# Patient Record
Sex: Male | Born: 1980
Health system: Southern US, Community
[De-identification: ages and names within clinical notes are randomized; demographics above are authoritative.]

## PROBLEM LIST (undated history)

## (undated) DIAGNOSIS — K219 Gastro-esophageal reflux disease without esophagitis: Secondary | ICD-10-CM

## (undated) DIAGNOSIS — I10 Essential (primary) hypertension: Secondary | ICD-10-CM

## (undated) DIAGNOSIS — F419 Anxiety disorder, unspecified: Secondary | ICD-10-CM

## (undated) DIAGNOSIS — D649 Anemia, unspecified: Secondary | ICD-10-CM

## (undated) DIAGNOSIS — C801 Malignant (primary) neoplasm, unspecified: Secondary | ICD-10-CM

## (undated) DIAGNOSIS — K651 Peritoneal abscess: Secondary | ICD-10-CM

## (undated) DIAGNOSIS — K921 Melena: Secondary | ICD-10-CM

## (undated) HISTORY — PX: BACK SURGERY: SHX140

## (undated) HISTORY — DX: Essential (primary) hypertension: I10

---

## 2014-07-07 DIAGNOSIS — D649 Anemia, unspecified: Secondary | ICD-10-CM

## 2014-07-07 HISTORY — DX: Anemia, unspecified: D64.9

## 2014-07-07 HISTORY — PX: COLONOSCOPY WITH ESOPHAGOGASTRODUODENOSCOPY (EGD): SHX5779

## 2014-07-16 DIAGNOSIS — K921 Melena: Secondary | ICD-10-CM | POA: Insufficient documentation

## 2017-07-13 ENCOUNTER — Inpatient Hospital Stay (HOSPITAL_COMMUNITY)
Admission: EM | Admit: 2017-07-13 | Discharge: 2017-08-08 | DRG: 987 | Disposition: A | Discharge status: Home / Self Care | Payer: 59 | Attending: General Surgery | Admitting: General Surgery

## 2017-07-13 ENCOUNTER — Emergency Department (HOSPITAL_COMMUNITY): Payer: 59

## 2017-07-13 ENCOUNTER — Other Ambulatory Visit: Payer: Self-pay

## 2017-07-13 ENCOUNTER — Encounter (HOSPITAL_COMMUNITY): Payer: Self-pay

## 2017-07-13 DIAGNOSIS — Y95 Nosocomial condition: Secondary | ICD-10-CM | POA: Diagnosis not present

## 2017-07-13 DIAGNOSIS — I10 Essential (primary) hypertension: Secondary | ICD-10-CM | POA: Diagnosis present

## 2017-07-13 DIAGNOSIS — A419 Sepsis, unspecified organism: Secondary | ICD-10-CM | POA: Diagnosis not present

## 2017-07-13 DIAGNOSIS — E860 Dehydration: Secondary | ICD-10-CM | POA: Diagnosis present

## 2017-07-13 DIAGNOSIS — D62 Acute posthemorrhagic anemia: Secondary | ICD-10-CM | POA: Diagnosis not present

## 2017-07-13 DIAGNOSIS — K651 Peritoneal abscess: Secondary | ICD-10-CM | POA: Diagnosis present

## 2017-07-13 DIAGNOSIS — C49A3 Gastrointestinal stromal tumor of small intestine: Secondary | ICD-10-CM | POA: Diagnosis not present

## 2017-07-13 DIAGNOSIS — R21 Rash and other nonspecific skin eruption: Secondary | ICD-10-CM | POA: Diagnosis not present

## 2017-07-13 DIAGNOSIS — K631 Perforation of intestine (nontraumatic): Secondary | ICD-10-CM | POA: Diagnosis present

## 2017-07-13 DIAGNOSIS — K219 Gastro-esophageal reflux disease without esophagitis: Secondary | ICD-10-CM | POA: Diagnosis not present

## 2017-07-13 DIAGNOSIS — N170 Acute kidney failure with tubular necrosis: Secondary | ICD-10-CM | POA: Diagnosis not present

## 2017-07-13 DIAGNOSIS — E872 Acidosis: Secondary | ICD-10-CM | POA: Diagnosis present

## 2017-07-13 DIAGNOSIS — N179 Acute kidney failure, unspecified: Secondary | ICD-10-CM

## 2017-07-13 DIAGNOSIS — R1031 Right lower quadrant pain: Secondary | ICD-10-CM | POA: Diagnosis not present

## 2017-07-13 DIAGNOSIS — K5900 Constipation, unspecified: Secondary | ICD-10-CM | POA: Diagnosis present

## 2017-07-13 DIAGNOSIS — E875 Hyperkalemia: Secondary | ICD-10-CM | POA: Diagnosis not present

## 2017-07-13 DIAGNOSIS — J189 Pneumonia, unspecified organism: Secondary | ICD-10-CM | POA: Diagnosis not present

## 2017-07-13 DIAGNOSIS — T368X5A Adverse effect of other systemic antibiotics, initial encounter: Secondary | ICD-10-CM | POA: Diagnosis present

## 2017-07-13 DIAGNOSIS — Z87891 Personal history of nicotine dependence: Secondary | ICD-10-CM | POA: Diagnosis not present

## 2017-07-13 DIAGNOSIS — L299 Pruritus, unspecified: Secondary | ICD-10-CM | POA: Diagnosis not present

## 2017-07-13 DIAGNOSIS — R509 Fever, unspecified: Secondary | ICD-10-CM

## 2017-07-13 DIAGNOSIS — N19 Unspecified kidney failure: Secondary | ICD-10-CM

## 2017-07-13 HISTORY — DX: Anemia, unspecified: D64.9

## 2017-07-13 HISTORY — DX: Peritoneal abscess: K65.1

## 2017-07-13 HISTORY — DX: Gastro-esophageal reflux disease without esophagitis: K21.9

## 2017-07-13 HISTORY — DX: Melena: K92.1

## 2017-07-13 LAB — URINALYSIS, ROUTINE W REFLEX MICROSCOPIC
Bilirubin Urine: NEGATIVE
Glucose, UA: NEGATIVE mg/dL
Hgb urine dipstick: NEGATIVE
Ketones, ur: 5 mg/dL — AB
Leukocytes, UA: NEGATIVE
Nitrite: NEGATIVE
Protein, ur: NEGATIVE mg/dL
Specific Gravity, Urine: 1.02 (ref 1.005–1.030)
pH: 6 (ref 5.0–8.0)

## 2017-07-13 LAB — CBC
HCT: 42.4 % (ref 39.0–52.0)
Hemoglobin: 15.3 g/dL (ref 13.0–17.0)
MCH: 33.2 pg (ref 26.0–34.0)
MCHC: 36.1 g/dL — ABNORMAL HIGH (ref 30.0–36.0)
MCV: 92 fL (ref 78.0–100.0)
Platelets: 258 10*3/uL (ref 150–400)
RBC: 4.61 MIL/uL (ref 4.22–5.81)
RDW: 13 % (ref 11.5–15.5)
WBC: 11.8 10*3/uL — ABNORMAL HIGH (ref 4.0–10.5)

## 2017-07-13 LAB — I-STAT CG4 LACTIC ACID, ED: Lactic Acid, Venous: 1.89 mmol/L (ref 0.5–1.9)

## 2017-07-13 LAB — COMPREHENSIVE METABOLIC PANEL
ALT: 19 U/L (ref 17–63)
AST: 20 U/L (ref 15–41)
Albumin: 3.8 g/dL (ref 3.5–5.0)
Alkaline Phosphatase: 90 U/L (ref 38–126)
Anion gap: 11 (ref 5–15)
BUN: 9 mg/dL (ref 6–20)
CO2: 27 mmol/L (ref 22–32)
Calcium: 9.5 mg/dL (ref 8.9–10.3)
Chloride: 96 mmol/L — ABNORMAL LOW (ref 101–111)
Creatinine, Ser: 1.24 mg/dL (ref 0.61–1.24)
GFR calc Af Amer: >60 mL/min (ref >60)
GFR calc non Af Amer: >60 mL/min (ref >60)
Glucose, Bld: 125 mg/dL — ABNORMAL HIGH (ref 65–99)
Potassium: 4.4 mmol/L (ref 3.5–5.1)
Sodium: 134 mmol/L — ABNORMAL LOW (ref 135–145)
Total Bilirubin: 1.4 mg/dL — ABNORMAL HIGH (ref 0.3–1.2)
Total Protein: 7.2 g/dL (ref 6.5–8.1)

## 2017-07-13 LAB — LIPASE, BLOOD: Lipase: 26 U/L (ref 11–51)

## 2017-07-13 MED ORDER — PIPERACILLIN-TAZOBACTAM 3.375 G IVPB: 3.3750 g | Freq: Three times a day (TID) | INTRAVENOUS | Status: DC

## 2017-07-13 MED ORDER — ZOLPIDEM TARTRATE 5 MG PO TABS
5.0000 mg | ORAL_TABLET | Freq: Every evening | ORAL | Status: DC | PRN
  Administered 2017-07-19 – 2017-07-23 (×7): 5 mg via ORAL
  Filled 2017-07-13 (×8): qty 1

## 2017-07-13 MED ORDER — ONDANSETRON HCL 4 MG/2ML IJ SOLN
4.0000 mg | Freq: Four times a day (QID) | INTRAMUSCULAR | Status: DC | PRN
  Administered 2017-07-14 – 2017-07-15 (×3): 4 mg via INTRAVENOUS
  Filled 2017-07-13 (×2): qty 2

## 2017-07-13 MED ORDER — ACETAMINOPHEN 650 MG RE SUPP: 650.0000 mg | Freq: Four times a day (QID) | RECTAL | Status: DC | PRN

## 2017-07-13 MED ORDER — IOHEXOL 300 MG/ML  SOLN
100.0000 mL | Freq: Once | INTRAMUSCULAR | Status: AC | PRN
  Administered 2017-07-13: 100 mL via INTRAVENOUS

## 2017-07-13 MED ORDER — KCL IN DEXTROSE-NACL 20-5-0.45 MEQ/L-%-% IV SOLN
INTRAVENOUS | Status: DC
  Administered 2017-07-13 – 2017-07-15 (×5): via INTRAVENOUS
  Administered 2017-07-15: 1 mL via INTRAVENOUS
  Administered 2017-07-16: 06:00:00 via INTRAVENOUS
  Filled 2017-07-13 (×8): qty 1000

## 2017-07-13 MED ORDER — SODIUM CHLORIDE 0.9 % IV BOLUS
1000.0000 mL | Freq: Once | INTRAVENOUS | Status: AC
  Administered 2017-07-13: 1000 mL via INTRAVENOUS

## 2017-07-13 MED ORDER — ENOXAPARIN SODIUM 40 MG/0.4ML ~~LOC~~ SOLN
40.0000 mg | SUBCUTANEOUS | Status: DC
  Administered 2017-07-13 – 2017-07-14 (×2): 40 mg via SUBCUTANEOUS
  Filled 2017-07-13 (×2): qty 0.4

## 2017-07-13 MED ORDER — FAMOTIDINE IN NACL 20-0.9 MG/50ML-% IV SOLN
20.0000 mg | Freq: Two times a day (BID) | INTRAVENOUS | Status: DC
  Administered 2017-07-13 – 2017-07-16 (×6): 20 mg via INTRAVENOUS
  Filled 2017-07-13 (×6): qty 50

## 2017-07-13 MED ORDER — PIPERACILLIN-TAZOBACTAM 3.375 G IVPB 30 MIN
3.3750 g | Freq: Once | INTRAVENOUS | Status: AC
  Administered 2017-07-13: 3.375 g via INTRAVENOUS
  Filled 2017-07-13: qty 50

## 2017-07-13 MED ORDER — ACETAMINOPHEN 325 MG PO TABS
650.0000 mg | ORAL_TABLET | Freq: Four times a day (QID) | ORAL | Status: DC | PRN
  Administered 2017-07-13 – 2017-07-15 (×5): 650 mg via ORAL
  Filled 2017-07-13 (×5): qty 2

## 2017-07-13 MED ORDER — ONDANSETRON 4 MG PO TBDP
4.0000 mg | ORAL_TABLET | Freq: Four times a day (QID) | ORAL | Status: DC | PRN
  Administered 2017-07-21 – 2017-08-06 (×5): 4 mg via ORAL
  Filled 2017-07-13 (×6): qty 1

## 2017-07-13 MED ORDER — PIPERACILLIN-TAZOBACTAM 3.375 G IVPB
3.3750 g | Freq: Three times a day (TID) | INTRAVENOUS | Status: DC
  Filled 2017-07-13 (×2): qty 50

## 2017-07-13 MED ORDER — MORPHINE SULFATE (PF) 4 MG/ML IV SOLN
1.0000 mg | INTRAVENOUS | Status: DC | PRN
  Administered 2017-07-14 – 2017-07-15 (×2): 2 mg via INTRAVENOUS
  Filled 2017-07-13 (×2): qty 1

## 2017-07-13 MED ORDER — DIPHENHYDRAMINE HCL 12.5 MG/5ML PO ELIX: 12.5000 mg | ORAL_SOLUTION | Freq: Four times a day (QID) | ORAL | Status: DC | PRN

## 2017-07-13 MED ORDER — PROMETHAZINE HCL 25 MG/ML IJ SOLN
12.5000 mg | Freq: Four times a day (QID) | INTRAMUSCULAR | Status: DC | PRN
  Administered 2017-07-16 – 2017-07-31 (×14): 12.5 mg via INTRAVENOUS
  Filled 2017-07-13 (×18): qty 1

## 2017-07-13 MED ORDER — DIPHENHYDRAMINE HCL 50 MG/ML IJ SOLN: 12.5000 mg | Freq: Four times a day (QID) | INTRAMUSCULAR | Status: DC | PRN

## 2017-07-13 NOTE — ED Notes (Signed)
Notified phlebotomy blood cultures needed x 2.

## 2017-07-13 NOTE — H&P (Signed)
[facility] Admission Note  KALAI BACA [birth year]  [id number].    Requesting MD: Carmin Muskrat Chief Complaint/Reason for Consult: abdominal abscess  HPI:  Joe Morris is a 37yo male who presented to [facility] earlier today with 4 days of persistent abdominal pain, fevers, and chills. States that the pain started Saturday evening. He had run a 5K with his children earlier in the day and ate pancakes for breakfast and pizza for lunch. He did not eat dinner because he did not feel well. Denies any known trauma to his abdomen. The pain is mostly central and radiates slightly to the right. Pain is worse with palpation or sitting up. He has had 1 episode of n/v. Denies diarrhea, constipation, dysuria, or blood in stool. States that he has never had pain like this before.   In the ED patient was found to have a TMAX 100.5, WBC 11.8, lactic acid 1.89, hemoglobin 15.3. CT scan abdomen pelvis showed complex 7.4 x 5.1 x 4.3 cm abscess central/left paracentral aspect of the abdomen displacing proximal small bowel loops and reaching the undersurface of the transverse colon; prominent vascular blush along the posterior superior aspect. Patient denies any NSAID/aspirin use. He does not smoke. He drinks about 2 beers daily. He does not take any anticoagulants. No prior h/o abdominal surgery.  Of note, patient does have a prior history of upper GI bleed in 2016. He developed symptomatic anemia with melena. He underwent EGD and colonoscopy at [facility]. Per patient these procedures were normal and did not reveal the cause of his GI bleed. Bleeding resolved spontaneously.  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  HENT: Negative.   Eyes: Negative.   Respiratory: Negative.   Cardiovascular: Negative.   Gastrointestinal: Positive for abdominal pain, nausea and vomiting. Negative for blood in stool, constipation,  diarrhea and melena.  Genitourinary: Negative.   Musculoskeletal: Negative.   Skin: Negative.   Neurological: Negative.    All systems reviewed and otherwise negative except for as above  History reviewed. No pertinent family history.  Past Medical History:  Diagnosis Date  . GI bleed     Past Surgical History:  Procedure Laterality Date  . COLONOSCOPY      Social History:  reports that he has never smoked. He does not have any smokeless tobacco history on file. He reports that he drinks alcohol. He reports that he does not use drugs.  Allergies: No Known Allergies   (Not in a hospital admission)  Prior to Admission medications   Not on File    Blood pressure 140/88, pulse (!) 107, temperature (!) 100.5 F (38.1 C), temperature source Oral, resp. rate 16, height _0  (1.702 m), weight 73.9 kg (163 lb), SpO2 100 %. Physical Exam: General: pleasant, WD/WN white male who is laying in bed in NAD HEENT: head is normocephalic, atraumatic.  Sclera are noninjected.  Pupils equal and round.  Ears and nose without any masses or lesions.  Mouth is pink and moist. Dentition fair Heart: regular, rate, and rhythm.  No obvious murmurs, gallops, or rubs noted.  Palpable pedal pulses bilaterally Lungs: CTAB, no wheezes, rhonchi, or rales noted.  Respiratory effort nonlabored Abd: soft, mild distension, +BS, no masses, hernias, or organomegaly. TTP central abdomen just proximal to umbilicus without rebound or guarding.  MS: all 4 extremities are symmetrical with no cyanosis, clubbing, or edema. Skin: warm and dry with no masses,  lesions, or rashes Psych: A&Ox3 with an appropriate affect. Neuro: cranial nerves grossly intact, extremity CSM intact bilaterally, normal speech  Results for orders placed or performed during the hospital encounter of 07/13/17 (from the past 48 hour(s))  Lipase, blood     Status: None   Collection Time: 07/13/17  1:04 PM  Result Value Ref Range   Lipase 26 11  - 51 U/L    Comment: Performed at [facility], [place] [address], [place], [place] [address]  Comprehensive metabolic panel     Status: Abnormal   Collection Time: 07/13/17  1:04 PM  Result Value Ref Range   Sodium 134 (L) 135 - 145 mmol/L   Potassium 4.4 3.5 - 5.1 mmol/L   Chloride 96 (L) 101 - 111 mmol/L   CO2 27 22 - 32 mmol/L   Glucose, Bld 125 (H) 65 - 99 mg/dL   BUN 9 6 - 20 mg/dL   Creatinine, Ser 1.24 0.61 - 1.24 mg/dL   Calcium 9.5 8.9 - 10.3 mg/dL   Total Protein 7.2 6.5 - 8.1 g/dL   Albumin 3.8 3.5 - 5.0 g/dL   AST 20 15 - 41 U/L   ALT 19 17 - 63 U/L   Alkaline Phosphatase 90 38 - 126 U/L   Total Bilirubin 1.4 (H) 0.3 - 1.2 mg/dL   GFR calc non Af Amer >60 >60 mL/min   GFR calc Af Amer >60 >60 mL/min    Comment: (NOTE) The eGFR has been calculated using the CKD EPI equation. This calculation has not been validated in all clinical situations. eGFR's persistently <60 mL/min signify possible Chronic Kidney Disease.    Anion gap 11 5 - 15    Comment: Performed at [facility] [address], [place], [place] [address]  CBC     Status: Abnormal   Collection Time: 07/13/17  1:04 PM  Result Value Ref Range   WBC 11.8 (H) 4.0 - 10.5 K/uL   RBC 4.61 4.22 - 5.81 MIL/uL   Hemoglobin 15.3 13.0 - 17.0 g/dL   HCT 42.4 39.0 - 52.0 %   MCV 92.0 78.0 - 100.0 fL   MCH 33.2 26.0 - 34.0 pg   MCHC 36.1 (H) 30.0 - 36.0 g/dL   RDW 13.0 11.5 - 15.5 %   Platelets 258 150 - 400 K/uL    Comment: Performed at [facility] [address], [place], [place] [address]  I-Stat CG4 Lactic Acid, ED     Status: None   Collection Time: 07/13/17  1:46 PM  Result Value Ref Range   Lactic Acid, Venous 1.89 0.5 - 1.9 mmol/L   Ct Abdomen Pelvis W Contrast  Result Date: 07/13/2017 CLINICAL DATA:  37 year old male with abdominal pain. Initial encounter. EXAM: CT ABDOMEN AND PELVIS WITH CONTRAST TECHNIQUE: Multidetector CT imaging of the abdomen and pelvis was performed using the  standard protocol following bolus administration of intravenous contrast. CONTRAST:  135m OMNIPAQUE IOHEXOL 300 MG/ML  SOLN COMPARISON:  None. FINDINGS: Lower chest: No lung base abnormality.  Heart size top-normal. Hepatobiliary: Enlarged liver spanning over 19.2 cm. No focal hepatic lesion. No calcified gallstone or common bile duct stone. Pancreas: No pancreatic mass or primary pancreatic inflammation. Spleen: Spleen top-normal size.  No splenic mass. Adrenals/Urinary Tract: No obstructing stone or hydronephrosis. No worrisome renal or adrenal mass. Noncontrast Stomach/Bowel: Complex 7.4 x 5.1 x 4.3 cm abscess central/left paracentral aspect of the abdomen displacing proximal small bowel loops and reaching  the undersurface of the transverse colon. Prominent vascular blush along the posterior superior aspect. Significant inflammation of surrounding fat planes. Etiology indeterminate. The most abnormal appearing bowel is the proximal small bowel. This abscess may reflect small bowel perforation, possibly from ingested material, underlying inflammatory process and less likely (although not excluded), underlying mass. I suspect involvement of the undersurface of the transverse colon is secondary. Adjacent lymph nodes probably reactive in origin. Mass effect upon the superior mesenteric vein which is narrowed. Appendix partially fluid-filled and separate from the abscess. No inflammation surrounds the appendix. Vascular/Lymphatic: No abdominal aortic aneurysm or large arterial vessel occlusion. Lymph nodes as noted above. Reproductive: Negative Other: No free air separate from abscess. Musculoskeletal: No worrisome osseous lesion. IMPRESSION: Complex 7.4 x 5.1 x 4.3 cm abscess central/left paracentral aspect of the abdomen displacing proximal small bowel loops and reaching the undersurface of the transverse colon. Prominent vascular blush along the posterior superior aspect. Significant inflammation of surrounding  fat planes. Etiology indeterminate. The most abnormal appearing bowel is the proximal small bowel. This abscess may reflect small bowel perforation, possibly from ingested material, underlying inflammatory process and less likely (although not excluded), underlying mass. I suspect involvement of the undersurface of the transverse colon is secondary. Adjacent lymph nodes probably reactive in origin. Mass effect upon the superior mesenteric vein which is narrowed. Enlarged liver spanning over 19.2 cm. These results were called by telephone at the time of interpretation on 07/13/2017 at 2:59 pm to Dr. Okey Regal , who verbally acknowledged these results. Request for surgical consultation. Electronically Signed   By: Genia Del M.D.   On: 07/13/2017 15:18   Anti-infectives (From admission, onward)   Start     Dose/Rate Route Frequency Ordered Stop   07/13/17 1530  piperacillin-tazobactam (ZOSYN) IVPB 3.375 g     3.375 g 100 mL/hr over 30 Minutes Intravenous  Once 07/13/17 1515          Assessment/Plan Prior h/o GI bleed with no known cause, workup unrevealing  Complex abdominal abscess - 4 days of abdominal pain, fever, chills - CT scan abdomen pelvis showed complex 7.4 x 5.1 x 4.3 cm abscess central/left paracentral aspect of the abdomen displacing proximal small bowel loops and reaching the undersurface of the transverse colon; prominent vascular blush along the posterior superior aspect. - TMAX 100.5, WBC 11.8, lactic acid 1.89, hemoglobin 15.3.   ID - zosyn 5/8>> VTE - SCDs FEN - IVF, NPO Foley - none Follow up - TBD  Plan - Patient with complex abdominal abscess of unknown origin associated with fever, chills, and leukocytosis. Likely will require exploratory laparotomy; MD to see patient. Keep NPO and continue IV zosyn.  Wellington Hampshire, [facility] 07/13/2017, 4:16 PM Pager: [phone] Consults: [phone] Mon-Fri 7:00 am-4:30 pm Sat-Sun 7:00 am-11:30  am

## 2017-07-13 NOTE — ED Notes (Signed)
Patient transported to CT 

## 2017-07-13 NOTE — ED Triage Notes (Signed)
Pt endorses RLQ and mid abd pain with fever since Saturday. Pt last took 1g tylenol at 0700 this morning. Oral temp 100.5 in triage. Tachy in triage.

## 2017-07-13 NOTE — ED Provider Notes (Signed)
[facility] EMERGENCY DEPARTMENT Provider Note   CSN: [id number] Arrival date & time: 07/13/17  1246     History   Chief Complaint Chief Complaint  Patient presents with  . Abdominal Pain    HPI Joe KEMPPAINEN is a 37 y.o. male.  HPI   37 year old male presents today with complaints of abdominal pain.  Patient notes 5 days ago he had right lower quadrant abdominal pain.  Patient notes his he has continued to worsen with the addition of fever.  Patient notes a bowel movement on Sunday, none since, he does note his urine has been slightly darker but he feels dehydrated.  Patient notes that he had Jell-O at 9 AM this morning. Pt reports history of GI bleed in 2016 with normal colonoscopy.. She notes prior to events he has remained active, noting he was actually exercising on the day symptoms started.   Past Medical History:  Diagnosis Date  . GI bleed     Patient Active Problem List   Diagnosis Date Noted  . Intra-abdominal abscess ([place]) 07/13/2017    Past Surgical History:  Procedure Laterality Date  . COLONOSCOPY          Home Medications    Prior to Admission medications   Medication Sig Start Date End Date Taking? Authorizing Provider  omeprazole (PRILOSEC) 20 MG capsule Take 20 mg by mouth as needed (Heartburn).   Yes [provider]    Family History History reviewed. No pertinent family history.  Social History Social History   Tobacco Use  . Smoking status: Never Smoker  Substance Use Topics  . Alcohol use: Yes    Frequency: Never    Comment: 6 beers per week  . Drug use: Never     Allergies   Patient has no known allergies.   Review of Systems Review of Systems  All other systems reviewed and are negative.    Physical Exam Updated Vital Signs BP 129/81 (BP Location: Right Arm)   Pulse 99   Temp 99.6 F (37.6 C) (Oral)   Resp 16   Ht 5\' 7"  (1.702 m)   Wt 73.9 kg (163 lb)   SpO2 100%   BMI 25.53 kg/m     Physical Exam  Constitutional: He is oriented to person, place, and time. He appears well-developed and well-nourished.  HENT:  Head: Normocephalic and atraumatic.  Eyes: Pupils are equal, round, and reactive to light. Conjunctivae are normal. Right eye exhibits no discharge. Left eye exhibits no discharge. No scleral icterus.  Neck: Normal range of motion. No JVD present. No tracheal deviation present.  Pulmonary/Chest: Effort normal. No stridor.  Abdominal: Soft. He exhibits no distension and no mass. There is tenderness. There is no rebound and no guarding. No hernia.  Exquisite tenderness palpation of mid abdomen, right lower abdomen  Neurological: He is alert and oriented to person, place, and time. Coordination normal.  Psychiatric: He has a normal mood and affect. His behavior is normal. Judgment and thought content normal.  Nursing note and vitals reviewed.    ED Treatments / Results  Labs (all labs ordered are listed, but only abnormal results are displayed) Labs Reviewed  COMPREHENSIVE METABOLIC PANEL - Abnormal; Notable for the following components:      Result Value   Sodium 134 (*)    Chloride 96 (*)    Glucose, Bld 125 (*)    Total Bilirubin 1.4 (*)    All other components within normal limits  CBC - Abnormal; Notable for the following components:   WBC 11.8 (*)    MCHC 36.1 (*)    All other components within normal limits  URINALYSIS, ROUTINE W REFLEX MICROSCOPIC - Abnormal; Notable for the following components:   Ketones, ur 5 (*)    All other components within normal limits  CULTURE, BLOOD (ROUTINE X 2)  CULTURE, BLOOD (ROUTINE X 2)  LIPASE, BLOOD  HIV ANTIBODY (ROUTINE TESTING)  BASIC METABOLIC PANEL  CBC  I-STAT CG4 LACTIC ACID, ED  TYPE AND SCREEN    EKG None  Radiology Ct Abdomen Pelvis W Contrast  Result Date: 07/13/2017 CLINICAL DATA:  37 year old male with abdominal pain. Initial encounter. EXAM: CT ABDOMEN AND PELVIS WITH CONTRAST  TECHNIQUE: Multidetector CT imaging of the abdomen and pelvis was performed using the standard protocol following bolus administration of intravenous contrast. CONTRAST:  113mL OMNIPAQUE IOHEXOL 300 MG/ML  SOLN COMPARISON:  None. FINDINGS: Lower chest: No lung base abnormality.  Heart size top-normal. Hepatobiliary: Enlarged liver spanning over 19.2 cm. No focal hepatic lesion. No calcified gallstone or common bile duct stone. Pancreas: No pancreatic mass or primary pancreatic inflammation. Spleen: Spleen top-normal size.  No splenic mass. Adrenals/Urinary Tract: No obstructing stone or hydronephrosis. No worrisome renal or adrenal mass. Noncontrast Stomach/Bowel: Complex 7.4 x 5.1 x 4.3 cm abscess central/left paracentral aspect of the abdomen displacing proximal small bowel loops and reaching the undersurface of the transverse colon. Prominent vascular blush along the posterior superior aspect. Significant inflammation of surrounding fat planes. Etiology indeterminate. The most abnormal appearing bowel is the proximal small bowel. This abscess may reflect small bowel perforation, possibly from ingested material, underlying inflammatory process and less likely (although not excluded), underlying mass. I suspect involvement of the undersurface of the transverse colon is secondary. Adjacent lymph nodes probably reactive in origin. Mass effect upon the superior mesenteric vein which is narrowed. Appendix partially fluid-filled and separate from the abscess. No inflammation surrounds the appendix. Vascular/Lymphatic: No abdominal aortic aneurysm or large arterial vessel occlusion. Lymph nodes as noted above. Reproductive: Negative Other: No free air separate from abscess. Musculoskeletal: No worrisome osseous lesion. IMPRESSION: Complex 7.4 x 5.1 x 4.3 cm abscess central/left paracentral aspect of the abdomen displacing proximal small bowel loops and reaching the undersurface of the transverse colon. Prominent vascular  blush along the posterior superior aspect. Significant inflammation of surrounding fat planes. Etiology indeterminate. The most abnormal appearing bowel is the proximal small bowel. This abscess may reflect small bowel perforation, possibly from ingested material, underlying inflammatory process and less likely (although not excluded), underlying mass. I suspect involvement of the undersurface of the transverse colon is secondary. Adjacent lymph nodes probably reactive in origin. Mass effect upon the superior mesenteric vein which is narrowed. Enlarged liver spanning over 19.2 cm. These results were called by telephone at the time of interpretation on 07/13/2017 at 2:59 pm to Dr. Okey Regal , who verbally acknowledged these results. Request for surgical consultation. Electronically Signed   By: Genia Del M.D.   On: 07/13/2017 15:18    Procedures Procedures (including critical care time)  Medications Ordered in ED Medications  dextrose 5 % and 0.45 % NaCl with KCl 20 mEq/L infusion ( Intravenous New Bag/Given 07/13/17 1959)  enoxaparin (LOVENOX) injection 40 mg (40 mg Subcutaneous Given 07/13/17 1959)  morphine 4 MG/ML injection 1-2 mg (has no administration in time range)  acetaminophen (TYLENOL) tablet 650 mg (650 mg Oral Given 07/13/17 1955)    Or  acetaminophen (TYLENOL)  suppository 650 mg ( Rectal See Alternative 07/13/17 1955)  diphenhydrAMINE (BENADRYL) 12.5 MG/5ML elixir 12.5 mg (has no administration in time range)    Or  diphenhydrAMINE (BENADRYL) injection 12.5 mg (has no administration in time range)  zolpidem (AMBIEN) tablet 5 mg (has no administration in time range)  ondansetron (ZOFRAN-ODT) disintegrating tablet 4 mg (has no administration in time range)    Or  ondansetron (ZOFRAN) injection 4 mg (has no administration in time range)  famotidine (PEPCID) IVPB 20 mg premix (has no administration in time range)  promethazine (PHENERGAN) injection 12.5 mg (has no administration in time  range)  piperacillin-tazobactam (ZOSYN) IVPB 3.375 g (has no administration in time range)  sodium chloride 0.9 % bolus 1,000 mL (0 mLs Intravenous Stopped 07/13/17 1415)  iohexol (OMNIPAQUE) 300 MG/ML solution 100 mL (100 mLs Intravenous Contrast Given 07/13/17 1444)  piperacillin-tazobactam (ZOSYN) IVPB 3.375 g (0 g Intravenous Stopped 07/13/17 1636)     Initial Impression / Assessment and Plan / ED Course  I have reviewed the triage vital signs and the nursing notes.  Pertinent labs & imaging results that were available during my care of the patient were reviewed by me and considered in my medical decision making (see chart for details).    Labs: I-STAT lactic acid, lipase, CMP CBC  Imaging: CT abdomen pelvis with contrast  Consults:  Therapeutics: Zosyn  Discharge Meds:   Assessment/Plan: 37 year old male presents today with what appears to be abscess.  Uncertain etiology.  Patient febrile tachycardic with slight elevation white count.  Patient started on normal saline, Zosyn, general surgery consulted.  General surgery evaluated patient at bedside will be taking patient for further evaluation management.      Final Clinical Impressions(s) / ED Diagnoses   Final diagnoses:  Intra-abdominal abscess [facility])    ED Discharge Orders    None       Francee Gentile 07/14/17 1410    Carmin Muskrat, MD 07/14/17 2038

## 2017-07-13 NOTE — ED Notes (Signed)
Report called to 6N. Remo Lipps, RN).

## 2017-07-14 ENCOUNTER — Encounter (HOSPITAL_COMMUNITY): Payer: Self-pay | Admitting: General Practice

## 2017-07-14 ENCOUNTER — Inpatient Hospital Stay (HOSPITAL_COMMUNITY): Payer: 59

## 2017-07-14 ENCOUNTER — Other Ambulatory Visit: Payer: Self-pay

## 2017-07-14 LAB — ABO/RH: ABO/RH(D): O POS

## 2017-07-14 LAB — BASIC METABOLIC PANEL
Anion gap: 10 (ref 5–15)
BUN: 6 mg/dL (ref 6–20)
CO2: 28 mmol/L (ref 22–32)
Calcium: 8.8 mg/dL — ABNORMAL LOW (ref 8.9–10.3)
Chloride: 101 mmol/L (ref 101–111)
Creatinine, Ser: 1.05 mg/dL (ref 0.61–1.24)
GFR calc Af Amer: >60 mL/min (ref >60)
GFR calc non Af Amer: >60 mL/min (ref >60)
Glucose, Bld: 142 mg/dL — ABNORMAL HIGH (ref 65–99)
Potassium: 4 mmol/L (ref 3.5–5.1)
Sodium: 139 mmol/L (ref 135–145)

## 2017-07-14 LAB — TYPE AND SCREEN
ABO/RH(D): O POS
Antibody Screen: NEGATIVE

## 2017-07-14 LAB — CBC
HCT: 38.3 % — ABNORMAL LOW (ref 39.0–52.0)
Hemoglobin: 13.1 g/dL (ref 13.0–17.0)
MCH: 31.6 pg (ref 26.0–34.0)
MCHC: 34.2 g/dL (ref 30.0–36.0)
MCV: 92.3 fL (ref 78.0–100.0)
Platelets: 252 10*3/uL (ref 150–400)
RBC: 4.15 MIL/uL — ABNORMAL LOW (ref 4.22–5.81)
RDW: 12.8 % (ref 11.5–15.5)
WBC: 9.9 10*3/uL (ref 4.0–10.5)

## 2017-07-14 LAB — HIV ANTIBODY (ROUTINE TESTING W REFLEX): HIV Screen 4th Generation wRfx: NONREACTIVE

## 2017-07-14 MED ORDER — PIPERACILLIN-TAZOBACTAM 3.375 G IVPB
3.3750 g | Freq: Three times a day (TID) | INTRAVENOUS | Status: DC
  Administered 2017-07-14 – 2017-07-16 (×6): 3.375 g via INTRAVENOUS
  Filled 2017-07-14 (×8): qty 50

## 2017-07-14 MED ORDER — IOHEXOL 300 MG/ML  SOLN
100.0000 mL | Freq: Once | INTRAMUSCULAR | Status: AC | PRN
  Administered 2017-07-14: 100 mL via INTRAVENOUS

## 2017-07-14 MED ORDER — VANCOMYCIN HCL IN DEXTROSE 750-5 MG/150ML-% IV SOLN
750.0000 mg | Freq: Three times a day (TID) | INTRAVENOUS | Status: DC
  Administered 2017-07-14 – 2017-07-16 (×7): 750 mg via INTRAVENOUS
  Filled 2017-07-14 (×8): qty 150

## 2017-07-14 MED ORDER — IOPAMIDOL (ISOVUE-300) INJECTION 61%: 30.0000 mL | INTRAVENOUS | Status: AC

## 2017-07-14 NOTE — Progress Notes (Signed)
Dr Brantley Stage made aware new CT result. No new orders received.

## 2017-07-14 NOTE — Progress Notes (Signed)
[facility] Progress Note     Subjective: CC- abdominal abscess Patient states that he had some intermittent crampy central abdominal pain last night with bloating, no better or worse than yesterday. He did have some mild nausea, no emesis. States that he had 2 loose green BMs yesterday. He has ambulated around the room this morning without any issues. BP and HR stable. Temp of 102.1 last night, vancomycin was added.  Objective: Vital signs in last 24 hours: Temp:  [98.6 F (37 C)-102.1 F (38.9 C)] 98.6 F (37 C) (05/09 0529) Pulse Rate:  [84-123] 98 (05/09 0356) Resp:  [16-20] 18 (05/09 0356) BP: (113-157)/(72-93) 132/84 (05/09 0356) SpO2:  [98 %-100 %] 98 % (05/09 0356) Weight:  [73.9 kg (163 lb)] 73.9 kg (163 lb) (05/08 1848) Last BM Date: 07/13/17  Intake/Output from previous day: 05/08 0701 - 05/09 0700 In: 2537.5 [I.V.:1287.5; IV Piggyback:1250] Out: 375 [Urine:375] Intake/Output this shift: No intake/output data recorded.  PE: Gen:  Alert, NAD, pleasant HEENT: EOM's intact, pupils equal and round Card:  RRR, no M/G/R heard Pulm:  CTAB, no W/R/R, effort normal Abd: soft, mild distension, +BS, no masses, hernias, or organomegaly. TTP central abdomen just proximal to umbilicus without rebound or guarding.  Ext:  Calves soft and nontender Psych: A&Ox3  Skin: no rashes noted, warm and dry  Lab Results:  Recent Labs    07/13/17 1304  WBC 11.8*  HGB 15.3  HCT 42.4  PLT 258   BMET Recent Labs    07/13/17 1304  NA 134*  K 4.4  CL 96*  CO2 27  GLUCOSE 125*  BUN 9  CREATININE 1.24  CALCIUM 9.5   PT/INR No results for input(s): LABPROT, INR in the last 72 hours. CMP     Component Value Date/Time   NA 134 (L) 07/13/2017 1304   K 4.4 07/13/2017 1304   CL 96 (L) 07/13/2017 1304   CO2 27 07/13/2017 1304   GLUCOSE 125 (H) 07/13/2017 1304   BUN 9 07/13/2017 1304   CREATININE 1.24 07/13/2017 1304   CALCIUM 9.5 07/13/2017 1304   PROT 7.2  07/13/2017 1304   ALBUMIN 3.8 07/13/2017 1304   AST 20 07/13/2017 1304   ALT 19 07/13/2017 1304   ALKPHOS 90 07/13/2017 1304   BILITOT 1.4 (H) 07/13/2017 1304   GFRNONAA >60 07/13/2017 1304   GFRAA >60 07/13/2017 1304   Lipase     Component Value Date/Time   LIPASE 26 07/13/2017 1304       Studies/Results: Ct Abdomen Pelvis W Contrast  Result Date: 07/13/2017 CLINICAL DATA:  37 year old male with abdominal pain. Initial encounter. EXAM: CT ABDOMEN AND PELVIS WITH CONTRAST TECHNIQUE: Multidetector CT imaging of the abdomen and pelvis was performed using the standard protocol following bolus administration of intravenous contrast. CONTRAST:  11mL OMNIPAQUE IOHEXOL 300 MG/ML  SOLN COMPARISON:  None. FINDINGS: Lower chest: No lung base abnormality.  Heart size top-normal. Hepatobiliary: Enlarged liver spanning over 19.2 cm. No focal hepatic lesion. No calcified gallstone or common bile duct stone. Pancreas: No pancreatic mass or primary pancreatic inflammation. Spleen: Spleen top-normal size.  No splenic mass. Adrenals/Urinary Tract: No obstructing stone or hydronephrosis. No worrisome renal or adrenal mass. Noncontrast Stomach/Bowel: Complex 7.4 x 5.1 x 4.3 cm abscess central/left paracentral aspect of the abdomen displacing proximal small bowel loops and reaching the undersurface of the transverse colon. Prominent vascular blush along the posterior superior aspect. Significant inflammation of surrounding fat planes. Etiology indeterminate. The most abnormal appearing  bowel is the proximal small bowel. This abscess may reflect small bowel perforation, possibly from ingested material, underlying inflammatory process and less likely (although not excluded), underlying mass. I suspect involvement of the undersurface of the transverse colon is secondary. Adjacent lymph nodes probably reactive in origin. Mass effect upon the superior mesenteric vein which is narrowed. Appendix partially fluid-filled and  separate from the abscess. No inflammation surrounds the appendix. Vascular/Lymphatic: No abdominal aortic aneurysm or large arterial vessel occlusion. Lymph nodes as noted above. Reproductive: Negative Other: No free air separate from abscess. Musculoskeletal: No worrisome osseous lesion. IMPRESSION: Complex 7.4 x 5.1 x 4.3 cm abscess central/left paracentral aspect of the abdomen displacing proximal small bowel loops and reaching the undersurface of the transverse colon. Prominent vascular blush along the posterior superior aspect. Significant inflammation of surrounding fat planes. Etiology indeterminate. The most abnormal appearing bowel is the proximal small bowel. This abscess may reflect small bowel perforation, possibly from ingested material, underlying inflammatory process and less likely (although not excluded), underlying mass. I suspect involvement of the undersurface of the transverse colon is secondary. Adjacent lymph nodes probably reactive in origin. Mass effect upon the superior mesenteric vein which is narrowed. Enlarged liver spanning over 19.2 cm. These results were called by telephone at the time of interpretation on 07/13/2017 at 2:59 pm to Dr. Okey Regal , who verbally acknowledged these results. Request for surgical consultation. Electronically Signed   By: Genia Del M.D.   On: 07/13/2017 15:18    Anti-infectives: Anti-infectives (From admission, onward)   Start     Dose/Rate Route Frequency Ordered Stop   07/14/17 2300  piperacillin-tazobactam (ZOSYN) IVPB 3.375 g     3.375 g 12.5 mL/hr over 240 Minutes Intravenous Every 8 hours 07/13/17 1930     07/14/17 0445  vancomycin (VANCOCIN) IVPB 750 mg/150 ml premix     750 mg 150 mL/hr over 60 Minutes Intravenous Every 8 hours 07/14/17 0431     07/13/17 1900  piperacillin-tazobactam (ZOSYN) IVPB 3.375 g  Status:  Discontinued     3.375 g 12.5 mL/hr over 240 Minutes Intravenous Every 8 hours 07/13/17 1847 07/13/17 1930    07/13/17 1530  piperacillin-tazobactam (ZOSYN) IVPB 3.375 g     3.375 g 100 mL/hr over 30 Minutes Intravenous  Once 07/13/17 1515 07/13/17 1636       Assessment/Plan Prior h/o GI bleed with no known cause, workup unrevealing  Complex intra-abdominal abscess - 4 days of abdominal pain, fever, chills - CT scan abdomen pelvis showed complex 7.4 x 5.1 x 4.3 cm abscess central/left paracentral aspect of the abdomen displacing proximal small bowel loops and reaching the undersurface of the transverse colon; prominent vascular blush along the posterior superior aspect. - TMAX 102.1, labs pending this AM  ID - zosyn 5/8>> VTE - SCDs FEN - IVF, NPO Foley - none Follow up - TBD  Plan - Repeat CT scan today with oral contrast. Labs pending. Continue IV zosyn/vancomycin, IVF, NPO.   LOS: 1 day    Wellington Hampshire , [facility] 07/14/2017, 8:04 AM Pager: [phone] Consults: [phone] Mon-Fri 7:00 am-4:30 pm Sat-Sun 7:00 am-11:30 am

## 2017-07-14 NOTE — Progress Notes (Signed)
Pharmacy Antibiotic Note  Joe Morris is a 37 y.o. male admitted on 07/13/2017 with abdominal abscess.  Pharmacy has been consulted for Vancomycin dosing. Already on Zosyn. Still spiking fevers. WBC 11.8. Renal function good.   Plan: Vancomycin 750 mg IV q8h Already on Zosyn Trend WBC, temp, renal function  F/U infectious work-up Drug levels as indicated   Height: 5\' 7"  (170.2 cm) Weight: 163 lb (73.9 kg) IBW/kg (Calculated) : 66.1  Temp (24hrs), Avg:100.3 F (37.9 C), Min:99.1 F (37.3 C), Max:102.1 F (38.9 C)  Recent Labs  Lab 07/13/17 1304 07/13/17 1346  WBC 11.8*  --   CREATININE 1.24  --   LATICACIDVEN  --  1.89    Estimated Creatinine Clearance: 78.5 mL/min (by C-G formula based on SCr of 1.24 mg/dL).    No Known Allergies  Joe Morris, Joe Morris 07/14/2017 4:34 AM

## 2017-07-14 NOTE — Progress Notes (Signed)
Spoke with Dr Barry Dienes concerning patient's temperature of 102.1 this morning. Dr Barry Dienes ordered IV vancomycin per pharmacy.

## 2017-07-14 NOTE — Progress Notes (Signed)
1800 CT Abd results is in, message sent to [facility] on call Dr Kae Heller but she called back that Dr Brantley Stage is the one on call tonight.

## 2017-07-15 ENCOUNTER — Encounter (HOSPITAL_COMMUNITY): Admission: EM | Disposition: A | Discharge status: Home / Self Care | Payer: Self-pay | Source: Home / Self Care

## 2017-07-15 ENCOUNTER — Encounter (HOSPITAL_COMMUNITY): Payer: Self-pay | Admitting: Certified Registered Nurse Anesthetist

## 2017-07-15 ENCOUNTER — Inpatient Hospital Stay (HOSPITAL_COMMUNITY): Payer: 59 | Admitting: Certified Registered Nurse Anesthetist

## 2017-07-15 HISTORY — PX: LAPAROSCOPY: SHX197

## 2017-07-15 HISTORY — PX: LAPAROTOMY: SHX154

## 2017-07-15 LAB — SURGICAL PCR SCREEN
MRSA, PCR: NEGATIVE
Staphylococcus aureus: POSITIVE — AB

## 2017-07-15 LAB — CBC
HCT: 37.8 % — ABNORMAL LOW (ref 39.0–52.0)
Hemoglobin: 12.9 g/dL — ABNORMAL LOW (ref 13.0–17.0)
MCH: 31.5 pg (ref 26.0–34.0)
MCHC: 34.1 g/dL (ref 30.0–36.0)
MCV: 92.4 fL (ref 78.0–100.0)
Platelets: 286 10*3/uL (ref 150–400)
RBC: 4.09 MIL/uL — ABNORMAL LOW (ref 4.22–5.81)
RDW: 13 % (ref 11.5–15.5)
WBC: 12.2 10*3/uL — ABNORMAL HIGH (ref 4.0–10.5)

## 2017-07-15 LAB — BASIC METABOLIC PANEL
Anion gap: 10 (ref 5–15)
BUN: <5 mg/dL — ABNORMAL LOW (ref 6–20)
CO2: 28 mmol/L (ref 22–32)
Calcium: 9.1 mg/dL (ref 8.9–10.3)
Chloride: 102 mmol/L (ref 101–111)
Creatinine, Ser: 1.16 mg/dL (ref 0.61–1.24)
GFR calc Af Amer: >60 mL/min (ref >60)
GFR calc non Af Amer: >60 mL/min (ref >60)
Glucose, Bld: 138 mg/dL — ABNORMAL HIGH (ref 65–99)
Potassium: 4 mmol/L (ref 3.5–5.1)
Sodium: 140 mmol/L (ref 135–145)

## 2017-07-15 SURGERY — LAPAROTOMY, EXPLORATORY
Anesthesia: General | Site: Abdomen

## 2017-07-15 MED ORDER — MEPERIDINE HCL 50 MG/ML IJ SOLN
INTRAMUSCULAR | Status: AC
  Filled 2017-07-15: qty 1

## 2017-07-15 MED ORDER — HYDROMORPHONE HCL 2 MG/ML IJ SOLN: 1.0000 mg | INTRAMUSCULAR | Status: DC

## 2017-07-15 MED ORDER — PROPOFOL 10 MG/ML IV BOLUS
INTRAVENOUS | Status: AC
  Filled 2017-07-15: qty 20

## 2017-07-15 MED ORDER — HYDROCODONE-ACETAMINOPHEN 7.5-325 MG PO TABS: 1.0000 | ORAL_TABLET | Freq: Once | ORAL | Status: DC | PRN

## 2017-07-15 MED ORDER — PROMETHAZINE HCL 25 MG/ML IJ SOLN
INTRAMUSCULAR | Status: AC
  Filled 2017-07-15: qty 1

## 2017-07-15 MED ORDER — HYDROMORPHONE HCL 2 MG/ML IJ SOLN
INTRAMUSCULAR | Status: AC
  Filled 2017-07-15: qty 1

## 2017-07-15 MED ORDER — FENTANYL CITRATE (PF) 100 MCG/2ML IJ SOLN
INTRAMUSCULAR | Status: DC | PRN
  Administered 2017-07-15: 50 ug via INTRAVENOUS
  Administered 2017-07-15 (×2): 100 ug via INTRAVENOUS
  Administered 2017-07-15 (×2): 50 ug via INTRAVENOUS
  Administered 2017-07-15 (×2): 100 ug via INTRAVENOUS
  Administered 2017-07-15 (×2): 50 ug via INTRAVENOUS
  Administered 2017-07-15: 100 ug via INTRAVENOUS

## 2017-07-15 MED ORDER — HYDROMORPHONE HCL 1 MG/ML IJ SOLN
1.0000 mg | INTRAMUSCULAR | Status: AC
  Administered 2017-07-15: 1 mg via INTRAVENOUS
  Filled 2017-07-15: qty 1

## 2017-07-15 MED ORDER — 0.9 % SODIUM CHLORIDE (POUR BTL) OPTIME
TOPICAL | Status: DC | PRN
  Administered 2017-07-15: 2000 mL
  Administered 2017-07-15: 1000 mL
  Administered 2017-07-15: 2000 mL

## 2017-07-15 MED ORDER — SODIUM CHLORIDE 0.9 % IV BOLUS
1000.0000 mL | Freq: Once | INTRAVENOUS | Status: AC
  Administered 2017-07-15: 1000 mL via INTRAVENOUS

## 2017-07-15 MED ORDER — FENTANYL CITRATE (PF) 250 MCG/5ML IJ SOLN
INTRAMUSCULAR | Status: AC
  Filled 2017-07-15: qty 5

## 2017-07-15 MED ORDER — MUPIROCIN 2 % EX OINT: 1.0000 "application " | TOPICAL_OINTMENT | Freq: Two times a day (BID) | CUTANEOUS | Status: DC

## 2017-07-15 MED ORDER — NALOXONE HCL 0.4 MG/ML IJ SOLN: 0.4000 mg | INTRAMUSCULAR | Status: DC | PRN

## 2017-07-15 MED ORDER — LIDOCAINE 2% (20 MG/ML) 5 ML SYRINGE
INTRAMUSCULAR | Status: AC
  Filled 2017-07-15: qty 5

## 2017-07-15 MED ORDER — SUGAMMADEX SODIUM 200 MG/2ML IV SOLN
INTRAVENOUS | Status: DC | PRN
  Administered 2017-07-15 (×7): 50 mg via INTRAVENOUS

## 2017-07-15 MED ORDER — ALBUMIN HUMAN 5 % IV SOLN
INTRAVENOUS | Status: DC | PRN
  Administered 2017-07-15: 16:00:00 via INTRAVENOUS

## 2017-07-15 MED ORDER — ENOXAPARIN SODIUM 40 MG/0.4ML ~~LOC~~ SOLN
40.0000 mg | SUBCUTANEOUS | Status: DC
  Administered 2017-07-16: 40 mg via SUBCUTANEOUS
  Filled 2017-07-15: qty 0.4

## 2017-07-15 MED ORDER — SODIUM CHLORIDE 0.9% FLUSH: 9.0000 mL | INTRAVENOUS | Status: DC | PRN

## 2017-07-15 MED ORDER — BUPIVACAINE HCL (PF) 0.25 % IJ SOLN
INTRAMUSCULAR | Status: DC | PRN
  Administered 2017-07-15: 2 mL

## 2017-07-15 MED ORDER — PROPOFOL 10 MG/ML IV BOLUS
INTRAVENOUS | Status: AC
Start: 2017-07-15 — End: ?
  Filled 2017-07-15: qty 20

## 2017-07-15 MED ORDER — BUPIVACAINE LIPOSOME 1.3 % IJ SUSP
20.0000 mL | INTRAMUSCULAR | Status: AC
  Administered 2017-07-15: 48 mL
  Filled 2017-07-15: qty 20

## 2017-07-15 MED ORDER — VANCOMYCIN HCL IN DEXTROSE 750-5 MG/150ML-% IV SOLN
750.0000 mg | INTRAVENOUS | Status: DC
  Filled 2017-07-15: qty 150

## 2017-07-15 MED ORDER — LIDOCAINE 2% (20 MG/ML) 5 ML SYRINGE
INTRAMUSCULAR | Status: DC | PRN
  Administered 2017-07-15: 100 mg via INTRAVENOUS

## 2017-07-15 MED ORDER — HYDROMORPHONE 1 MG/ML IV SOLN
INTRAVENOUS | Status: AC
  Filled 2017-07-15: qty 25

## 2017-07-15 MED ORDER — MIDAZOLAM HCL 2 MG/2ML IJ SOLN
INTRAMUSCULAR | Status: AC
  Filled 2017-07-15: qty 2

## 2017-07-15 MED ORDER — DIPHENHYDRAMINE HCL 12.5 MG/5ML PO ELIX: 12.5000 mg | ORAL_SOLUTION | Freq: Four times a day (QID) | ORAL | Status: DC | PRN

## 2017-07-15 MED ORDER — PROMETHAZINE HCL 25 MG/ML IJ SOLN
6.2500 mg | INTRAMUSCULAR | Status: DC | PRN
  Administered 2017-07-15: 6.25 mg via INTRAVENOUS

## 2017-07-15 MED ORDER — DIPHENHYDRAMINE HCL 50 MG/ML IJ SOLN: 12.5000 mg | Freq: Four times a day (QID) | INTRAMUSCULAR | Status: DC | PRN

## 2017-07-15 MED ORDER — LACTATED RINGERS IV SOLN
INTRAVENOUS | Status: DC
  Administered 2017-07-15 (×3): via INTRAVENOUS

## 2017-07-15 MED ORDER — BUPIVACAINE-EPINEPHRINE (PF) 0.25% -1:200000 IJ SOLN
INTRAMUSCULAR | Status: AC
  Filled 2017-07-15: qty 30

## 2017-07-15 MED ORDER — ONDANSETRON HCL 4 MG/2ML IJ SOLN
INTRAMUSCULAR | Status: AC
  Filled 2017-07-15: qty 2

## 2017-07-15 MED ORDER — ACETAMINOPHEN 10 MG/ML IV SOLN
INTRAVENOUS | Status: AC
  Filled 2017-07-15: qty 100

## 2017-07-15 MED ORDER — ACETAMINOPHEN 10 MG/ML IV SOLN
1000.0000 mg | Freq: Once | INTRAVENOUS | Status: DC | PRN
  Administered 2017-07-15: 1000 mg via INTRAVENOUS

## 2017-07-15 MED ORDER — MIDAZOLAM HCL 5 MG/5ML IJ SOLN
INTRAMUSCULAR | Status: DC | PRN
  Administered 2017-07-15: 2 mg via INTRAVENOUS

## 2017-07-15 MED ORDER — VECURONIUM BROMIDE 10 MG IV SOLR
INTRAVENOUS | Status: DC | PRN
  Administered 2017-07-15 (×2): 5 mg via INTRAVENOUS

## 2017-07-15 MED ORDER — HYDROMORPHONE 1 MG/ML IV SOLN
INTRAVENOUS | Status: DC
  Administered 2017-07-15: 18:00:00 via INTRAVENOUS
  Administered 2017-07-15: 3.2 mg via INTRAVENOUS
  Administered 2017-07-16: 2.1 mg via INTRAVENOUS
  Administered 2017-07-16: 2.4 mg via INTRAVENOUS
  Administered 2017-07-16: 5.9 mg via INTRAVENOUS
  Administered 2017-07-16: 0.6 mg via INTRAVENOUS
  Administered 2017-07-16: 1.2 mg via INTRAVENOUS

## 2017-07-15 MED ORDER — PROPOFOL 10 MG/ML IV BOLUS
INTRAVENOUS | Status: DC | PRN
  Administered 2017-07-15: 200 mg via INTRAVENOUS

## 2017-07-15 MED ORDER — ENOXAPARIN SODIUM 40 MG/0.4ML ~~LOC~~ SOLN: 40.0000 mg | SUBCUTANEOUS | Status: DC

## 2017-07-15 MED ORDER — HYDROMORPHONE HCL 2 MG/ML IJ SOLN
0.2500 mg | INTRAMUSCULAR | Status: DC | PRN
  Administered 2017-07-15: 0.5 mg via INTRAVENOUS

## 2017-07-15 MED ORDER — ROCURONIUM BROMIDE 100 MG/10ML IV SOLN
INTRAVENOUS | Status: DC | PRN
  Administered 2017-07-15: 30 mg via INTRAVENOUS
  Administered 2017-07-15: 10 mg via INTRAVENOUS
  Administered 2017-07-15: 20 mg via INTRAVENOUS
  Administered 2017-07-15: 10 mg via INTRAVENOUS

## 2017-07-15 MED ORDER — METHOCARBAMOL 1000 MG/10ML IJ SOLN
500.0000 mg | Freq: Three times a day (TID) | INTRAVENOUS | Status: DC | PRN
  Filled 2017-07-15: qty 5

## 2017-07-15 MED ORDER — MEPERIDINE HCL 50 MG/ML IJ SOLN
6.2500 mg | INTRAMUSCULAR | Status: DC | PRN
  Administered 2017-07-15: 12.5 mg via INTRAVENOUS

## 2017-07-15 MED ORDER — ONDANSETRON HCL 4 MG/2ML IJ SOLN
4.0000 mg | Freq: Four times a day (QID) | INTRAMUSCULAR | Status: DC | PRN
  Administered 2017-07-16 (×2): 4 mg via INTRAVENOUS
  Filled 2017-07-15 (×2): qty 2

## 2017-07-15 MED ORDER — SUGAMMADEX SODIUM 200 MG/2ML IV SOLN
INTRAVENOUS | Status: AC
  Filled 2017-07-15: qty 2

## 2017-07-15 SURGICAL SUPPLY — 71 items
BIOPATCH RED 1 DISK 7.0 (GAUZE/BANDAGES/DRESSINGS) ×2 IMPLANT
BLADE CLIPPER SURG (BLADE) ×2 IMPLANT
CANISTER SUCT 3000ML PPV (MISCELLANEOUS) ×2 IMPLANT
CELLS DAT CNTRL 66122 CELL SVR (MISCELLANEOUS) ×1 IMPLANT
CHLORAPREP W/TINT 26ML (MISCELLANEOUS) ×2 IMPLANT
COVER SURGICAL LIGHT HANDLE (MISCELLANEOUS) ×2 IMPLANT
DECANTER SPIKE VIAL GLASS SM (MISCELLANEOUS) ×4 IMPLANT
DERMABOND ADVANCED (GAUZE/BANDAGES/DRESSINGS)
DERMABOND ADVANCED .7 DNX12 (GAUZE/BANDAGES/DRESSINGS) IMPLANT
DRAIN CHANNEL 19F RND (DRAIN) ×2 IMPLANT
DRAPE LAPAROSCOPIC ABDOMINAL (DRAPES) ×2 IMPLANT
DRAPE WARM FLUID 44X44 (DRAPE) ×2 IMPLANT
DRSG OPSITE POSTOP 4X10 (GAUZE/BANDAGES/DRESSINGS) IMPLANT
DRSG OPSITE POSTOP 4X8 (GAUZE/BANDAGES/DRESSINGS) IMPLANT
DRSG VAC ATS SM SENSATRAC (GAUZE/BANDAGES/DRESSINGS) ×2 IMPLANT
ELECT BLADE 4.0 EZ CLEAN MEGAD (MISCELLANEOUS) ×2
ELECT BLADE 6.5 EXT (BLADE) ×2 IMPLANT
ELECT CAUTERY BLADE 6.4 (BLADE) ×2 IMPLANT
ELECT REM PT RETURN 9FT ADLT (ELECTROSURGICAL) ×2
ELECTRODE BLDE 4.0 EZ CLN MEGD (MISCELLANEOUS) ×1 IMPLANT
ELECTRODE REM PT RTRN 9FT ADLT (ELECTROSURGICAL) ×1 IMPLANT
EVACUATOR SILICONE 100CC (DRAIN) ×2 IMPLANT
GAUZE SPONGE 4X4 12PLY STRL (GAUZE/BANDAGES/DRESSINGS) ×2 IMPLANT
GLOVE BIO SURGEON STRL SZ7.5 (GLOVE) ×2 IMPLANT
GLOVE BIOGEL M STRL SZ7.5 (GLOVE) ×4 IMPLANT
GLOVE BIOGEL PI IND STRL 7.0 (GLOVE) ×1 IMPLANT
GLOVE BIOGEL PI IND STRL 8 (GLOVE) ×2 IMPLANT
GLOVE BIOGEL PI INDICATOR 7.0 (GLOVE) ×1
GLOVE BIOGEL PI INDICATOR 8 (GLOVE) ×2
GLOVE INDICATOR 6.5 STRL GRN (GLOVE) ×2 IMPLANT
GOWN STRL REUS W/ TWL LRG LVL3 (GOWN DISPOSABLE) ×4 IMPLANT
GOWN STRL REUS W/TWL 2XL LVL3 (GOWN DISPOSABLE) ×2 IMPLANT
GOWN STRL REUS W/TWL LRG LVL3 (GOWN DISPOSABLE) ×4
KIT BASIN OR (CUSTOM PROCEDURE TRAY) ×2 IMPLANT
KIT TURNOVER KIT B (KITS) ×2 IMPLANT
LIGASURE IMPACT 36 18CM CVD LR (INSTRUMENTS) ×2 IMPLANT
NS IRRIG 1000ML POUR BTL (IV SOLUTION) ×4 IMPLANT
PACK GENERAL/GYN (CUSTOM PROCEDURE TRAY) ×2 IMPLANT
PAD ARMBOARD 7.5X6 YLW CONV (MISCELLANEOUS) ×4 IMPLANT
RELOAD PROXIMATE 75MM BLUE (ENDOMECHANICALS) ×4 IMPLANT
RTRCTR WOUND ALEXIS 18CM MED (MISCELLANEOUS) ×2
SCISSORS LAP 5X35 DISP (ENDOMECHANICALS) IMPLANT
SET IRRIG TUBING LAPAROSCOPIC (IRRIGATION / IRRIGATOR) ×2 IMPLANT
SHEARS HARMONIC ACE PLUS 36CM (ENDOMECHANICALS) IMPLANT
SLEEVE ENDOPATH XCEL 5M (ENDOMECHANICALS) ×2 IMPLANT
SPECIMEN JAR LARGE (MISCELLANEOUS) IMPLANT
SPONGE LAP 18X18 RF (DISPOSABLE) ×4 IMPLANT
SPONGE LAP 18X18 X RAY DECT (DISPOSABLE) ×2 IMPLANT
STAPLER PROXIMATE 75MM BLUE (STAPLE) ×2 IMPLANT
STAPLER VISISTAT 35W (STAPLE) IMPLANT
STRIP CLOSURE SKIN 1/2X4 (GAUZE/BANDAGES/DRESSINGS) ×2 IMPLANT
SUCTION POOLE TIP (SUCTIONS) ×2 IMPLANT
SUT ETHILON 2 0 FS 18 (SUTURE) ×2 IMPLANT
SUT MNCRL AB 4-0 PS2 18 (SUTURE) ×2 IMPLANT
SUT PDS AB 1 TP1 96 (SUTURE) ×4 IMPLANT
SUT SILK 2 0 SH CR/8 (SUTURE) ×2 IMPLANT
SUT SILK 2 0 TIES 10X30 (SUTURE) ×2 IMPLANT
SUT SILK 3 0 SH CR/8 (SUTURE) ×6 IMPLANT
SUT SILK 3 0 TIES 10X30 (SUTURE) IMPLANT
SUT VIC AB 3-0 SH 18 (SUTURE) IMPLANT
SYR BULB IRRIGATION 50ML (SYRINGE) ×2 IMPLANT
TOWEL OR 17X24 6PK STRL BLUE (TOWEL DISPOSABLE) ×2 IMPLANT
TOWEL OR 17X26 10 PK STRL BLUE (TOWEL DISPOSABLE) ×2 IMPLANT
TRAY FOLEY W/METER SILVER 14FR (SET/KITS/TRAYS/PACK) ×2 IMPLANT
TRAY LAPAROSCOPIC MC (CUSTOM PROCEDURE TRAY) ×2 IMPLANT
TROCAR XCEL BLUNT TIP 100MML (ENDOMECHANICALS) IMPLANT
TROCAR XCEL NON-BLD 11X100MML (ENDOMECHANICALS) IMPLANT
TROCAR XCEL NON-BLD 5MMX100MML (ENDOMECHANICALS) ×2 IMPLANT
TUBING INSUFFLATION (TUBING) ×2 IMPLANT
WND VAC CANISTER 500ML (MISCELLANEOUS) ×2 IMPLANT
YANKAUER SUCT BULB TIP NO VENT (SUCTIONS) ×2 IMPLANT

## 2017-07-15 NOTE — Anesthesia Preprocedure Evaluation (Signed)
Anesthesia Evaluation  Patient identified by MRN, date of birth, ID band Patient awake    Reviewed: Allergy & Precautions, NPO status , Patient's Chart, lab work & pertinent test results  Airway Mallampati: II  TM Distance: >3 FB Neck ROM: Full    Dental no notable dental hx.    Pulmonary neg pulmonary ROS, former smoker,    Pulmonary exam normal breath sounds clear to auscultation       Cardiovascular negative cardio ROS Normal cardiovascular exam Rhythm:Regular Rate:Normal     Neuro/Psych negative neurological ROS  negative psych ROS   GI/Hepatic negative GI ROS, Neg liver ROS, GERD  ,  Endo/Other  negative endocrine ROS  Renal/GU negative Renal ROS  negative genitourinary   Musculoskeletal negative musculoskeletal ROS (+)   Abdominal   Peds negative pediatric ROS (+)  Hematology negative hematology ROS (+) anemia ,   Anesthesia Other Findings   Reproductive/Obstetrics negative OB ROS                             Anesthesia Physical Anesthesia Plan  ASA: II  Anesthesia Plan: General   Post-op Pain Management:    Induction:   PONV Risk Score and Plan: Treatment may vary due to age or medical condition, Ondansetron, Dexamethasone and Midazolam  Airway Management Planned: Oral ETT  Additional Equipment:   Intra-op Plan:   Post-operative Plan: Extubation in OR  Informed Consent: I have reviewed the patients History and Physical, chart, labs and discussed the procedure including the risks, benefits and alternatives for the proposed anesthesia with the patient or authorized representative who has indicated his/her understanding and acceptance.   Dental advisory given  Plan Discussed with: CRNA  Anesthesia Plan Comments:         Anesthesia Quick Evaluation

## 2017-07-15 NOTE — Progress Notes (Signed)
Initial Nutrition Assessment  DOCUMENTATION CODES:   Not applicable  INTERVENTION:   -RD will follow for diet advancement and supplement diet as appropriate -If prolonged NPO/ clear liquid diet status is anticipated, consider initiation of nutrition support  NUTRITION DIAGNOSIS:   Inadequate oral intake related to altered GI function as evidenced by NPO status.  GOAL:   Patient will meet greater than or equal to 90% of their needs  MONITOR:   Diet advancement, Labs, Weight trends, Skin, I & O's  REASON FOR ASSESSMENT:   Malnutrition Screening Tool    ASSESSMENT:   Joe Morris is a 37yo male who presented to [facility] earlier today with 4 days of persistent abdominal pain, fevers, and chills.  Pt admitted with complex abdominal abscess. CT scan of abdomen and pelvis revealed abscess of central/ lt paracentral abscess of abdomen (unknown etiology per surgery notes).   Pt out of room for surgery at time of visit (per general surgery notes, plan for diagnostic laparoscopy, possible exploratory laparotomy, and possible bowel resection today). No family present to provide further hx. Unable to complete Nutrition-Focused physical exam at this time.   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 Commonly consumed foods are pancakes and pizza.   Reviewed wt hx; per [facility] report, pt endorses a 10# wt loss within the past week. Reviewed wt records from [facility]; noted wt of 168# on 07/16/14 (suspect this is UBW). No recent wt hx available.   Labs reviewed.   Diet Order:   Diet Order           Diet NPO time specified Except for: Ice Chips, Sips with Meds  Diet effective now          EDUCATION NEEDS:   No education needs have been identified at this time  Skin:  Skin Assessment: Reviewed RN Assessment  Last BM:  07/14/17  Height:   Ht Readings from Last 1 Encounters:  07/15/17 5\' 7"  (1.702 m)    Weight:   Wt Readings from  Last 1 Encounters:  07/15/17 163 lb (73.9 kg)    Ideal Body Weight:  67.3 kg  BMI:  Body mass index is 25.53 kg/m.  Estimated Nutritional Needs:   Kcal:  2000-2200  Protein:  95-110 grams  Fluid:  2.0-2.2 L    Chelsei Mcchesney A. Jimmye Norman, RD, LDN, CDE Pager: [phone] After hours Pager: [phone]

## 2017-07-15 NOTE — Discharge Instructions (Addendum)
Moist gauze on top of wound and cover with dry gauze daily.  Can likely just switch to dry gauze soon.  [facility], [place] [phone]  OPEN ABDOMINAL SURGERY: POST OP INSTRUCTIONS  Always review your discharge instruction sheet given to you by the facility where your surgery was performed.  IF YOU HAVE DISABILITY OR FAMILY LEAVE FORMS, YOU MUST BRING THEM TO THE OFFICE FOR PROCESSING.  PLEASE DO NOT GIVE THEM TO YOUR DOCTOR.  1. A prescription for pain medication may be given to you upon discharge.  Take your pain medication as prescribed, if needed.  If narcotic pain medicine is not needed, then you may take acetaminophen (Tylenol) or ibuprofen (Advil) as needed. 2. Take your usually prescribed medications unless otherwise directed. 3. If you need a refill on your pain medication, please contact your pharmacy. They will contact our office to request authorization.  Prescriptions will not be filled after 5pm or on week-ends. 4. You should follow a light diet the first few days after arrival home, such as soup and crackers, pudding, etc.unless your doctor has advised otherwise. A high-fiber, low fat diet can be resumed as tolerated.   Be sure to include lots of fluids daily. Most patients will experience some swelling and bruising on the chest and neck area.  Ice packs will help.  Swelling and bruising can take several days to resolve 5. Most patients will experience some swelling and bruising in the area of the incision. Ice pack will help. Swelling and bruising can take several days to resolve..  6. It is common to experience some constipation if taking pain medication after surgery.  Increasing fluid intake and taking a stool softener will usually help or prevent this problem from occurring.  A mild laxative (Milk of Magnesia or Miralax) should be taken according to package directions if there are no bowel movements after 48 hours. 7.  You may have steri-strips (small skin  tapes) in place directly over the incision.  These strips should be left on the skin for 7-10 days.  If your surgeon used skin glue on the incision, you may shower in 24 hours.  The glue will flake off over the next 2-3 weeks.  Any sutures or staples will be removed at the office during your follow-up visit. You may find that a light gauze bandage over your incision may keep your staples from being rubbed or pulled. You may shower and replace the bandage daily. 8. ACTIVITIES:  You may resume regular (light) daily activities beginning the next day--such as daily self-care, walking, climbing stairs--gradually increasing activities as tolerated.  You may have sexual intercourse when it is comfortable.  Refrain from any heavy lifting or straining until approved by your doctor. a. You may drive when you no longer are taking prescription pain medication, you can comfortably wear a seatbelt, and you can safely maneuver your car and apply brakes b. Return to Work: ___________________________________ 32. You should see your doctor in the office for a follow-up appointment approximately two weeks after your surgery.  Make sure that you call for this appointment within a day or two after you arrive home to insure a convenient appointment time. OTHER INSTRUCTIONS:  _____________________________________________________________ _____________________________________________________________  WHEN TO CALL YOUR DOCTOR: 1. Fever over 101.0 2. Inability to urinate 3. Nausea and/or vomiting 4. Extreme swelling or bruising 5. Continued bleeding from incision. 6. Increased pain, redness, or drainage from the incision. 7. Difficulty swallowing or breathing 8.  Muscle cramping or spasms. 9. Numbness or tingling in hands or feet or around lips.  The clinic staff is available to answer your questions during regular business hours.  Please dont hesitate to call and ask to speak to one of the nurses if you have  concerns.  For further questions, please visit [email or web address]

## 2017-07-15 NOTE — Progress Notes (Signed)
Pt for OR for possible ex-lap and bowel resection, NPO midnight, with consent, CHG bath done,PCR pending alert and oriented , his mother at the bedside.

## 2017-07-15 NOTE — Anesthesia Postprocedure Evaluation (Signed)
Anesthesia Post Note  Patient: Joe Morris  Procedure(s) Performed: EXPLORATORY LAPAROTOMY PROXIMAL SMALL BOWEL RESECTION, DRAINAGE OF ABDOMINAL ABSCESS (N/A Abdomen) LAPAROSCOPY DIAGNOSTIC (N/A Abdomen)     Patient location during evaluation: PACU Anesthesia Type: General Level of consciousness: awake and alert Pain management: pain level controlled Vital Signs Assessment: post-procedure vital signs reviewed and stable Respiratory status: spontaneous breathing, nonlabored ventilation, respiratory function stable and patient connected to nasal cannula oxygen Cardiovascular status: blood pressure returned to baseline and stable Postop Assessment: no apparent nausea or vomiting Anesthetic complications: no    Last Vitals:  Vitals:   07/15/17 1850 07/15/17 1900  BP:  130/78  Pulse: (!) 123 (!) 123  Resp: (!) 22 18  Temp:    SpO2: 97% 97%    Last Pain:  Vitals:   07/15/17 1900  TempSrc:   PainSc: 6                  Catalina Gravel

## 2017-07-15 NOTE — Transfer of Care (Signed)
Immediate Anesthesia Transfer of Care Note  Patient: Joe Morris  Procedure(s) Performed: EXPLORATORY LAPAROTOMY PROXIMAL SMALL BOWEL RESECTION, DRAINAGE OF ABDOMINAL ABSCESS (N/A Abdomen) LAPAROSCOPY DIAGNOSTIC (N/A Abdomen)  Patient Location: PACU  Anesthesia Type:General  Level of Consciousness: awake, alert , oriented and patient cooperative  Airway & Oxygen Therapy: Patient Spontanous Breathing and Patient connected to nasal cannula oxygen  Post-op Assessment: Report given to RN and Post -op Vital signs reviewed and stable  Post vital signs: Reviewed and stable  Last Vitals:  Vitals Value Taken Time  BP 138/95 07/15/2017  5:00 PM  Temp    Pulse 149 07/15/2017  5:03 PM  Resp 25 07/15/2017  5:05 PM  SpO2 92 % 07/15/2017  5:03 PM  Vitals shown include unvalidated device data.  Last Pain:  Vitals:   07/15/17 0928  TempSrc:   PainSc: 0-No pain      Patients Stated Pain Goal: 3 (00/34/91 7915)  Complications: No apparent anesthesia complications

## 2017-07-15 NOTE — Brief Op Note (Signed)
07/15/2017  4:58 PM  PATIENT:  Joe Morris  37 y.o. male  PRE-OPERATIVE DIAGNOSIS:  Intra-abdominal abcess  POST-OPERATIVE DIAGNOSIS:  Perforated proximal jejunum with intra-abdominal abscess  PROCEDURE:  Procedure(s): EXPLORATORY LAPAROTOMY PROXIMAL SMALL BOWEL RESECTION, DRAINAGE OF ABDOMINAL ABSCESS (N/A) LAPAROSCOPY DIAGNOSTIC (N/A)  APPLICATION OF WOUND VAC  SURGEON:  Surgeon(s) and Role:    Greer Pickerel, MD - Primary    * Clovis Riley, MD - Assisting  PHYSICIAN ASSISTANT: Margie Billet PA-C   ASSISTANTS: see above   ANESTHESIA:   general  EBL:  100 mL   BLOOD ADMINISTERED:none  DRAINS: (45fr) Jackson-Pratt drain(s) with closed bulb suction in the LUQ below transverse colon, near small bowel anastomosis.    LOCAL MEDICATIONS USED:  OTHER exparel/marcaine  SPECIMEN:  Source of Specimen:  proximal jejunum - stitch proximal  DISPOSITION OF SPECIMEN:  PATHOLOGY  COUNTS:  YES  TOURNIQUET:  * No tourniquets in log *  DICTATION: .Other Dictation: Dictation Number [id number]  PLAN OF CARE: pacu then floor  PATIENT DISPOSITION:  PACU - hemodynamically stable.   Delay start of Pharmacological VTE agent (>24hrs) due to surgical blood loss or risk of bleeding: no  Leighton Ruff. Redmond Pulling, MD, FACS General, Bariatric, & Minimally Invasive Surgery [facility], [place]

## 2017-07-15 NOTE — Progress Notes (Signed)
Patient ID: Joe Morris, male   DOB: April 25, 1980, 37 y.o.   MRN: [id number]   Acute Care Surgery Service Progress Note:    Chief Complaint/Subjective: States he has worsening abdominal pain this morning.  More nausea as well.  Objective: Vital signs in last 24 hours: Temp:  [98.3 F (36.8 C)-100.5 F (38.1 C)] 98.3 F (36.8 C) (05/10 0531) Pulse Rate:  [82-101] 82 (05/10 0531) Resp:  [16] 16 (05/10 0531) BP: (116-138)/(73-77) 116/74 (05/10 0531) SpO2:  [99 %-100 %] 100 % (05/10 0531) Weight:  [73.9 kg (163 lb)] 73.9 kg (163 lb) (05/10 1224) Last BM Date: 07/14/17  Intake/Output from previous day: 05/09 0701 - 05/10 0700 In: 3066.7 [I.V.:2366.7; IV Piggyback:700] Out: 2400 [Urine:2400] Intake/Output this shift: No intake/output data recorded.  Lungs: cta, nonlabored  Cardiovascular: reg  Abd: Soft, tender to palpation periumbilical to the upper midline, not really distended  Extremities: no edema, +SCDs  Neuro: alert, nonfocal  Lab Results: CBC  Recent Labs    07/14/17 0709 07/15/17 0513  WBC 9.9 12.2*  HGB 13.1 12.9*  HCT 38.3* 37.8*  PLT 252 286   BMET Recent Labs    07/14/17 0709 07/15/17 0513  NA 139 140  K 4.0 4.0  CL 101 102  CO2 28 28  GLUCOSE 142* 138*  BUN 6 <5*  CREATININE 1.05 1.16  CALCIUM 8.8* 9.1   LFT Hepatic Function Latest Ref Rng & Units 07/13/2017  Total Protein 6.5 - 8.1 g/dL 7.2  Albumin 3.5 - 5.0 g/dL 3.8  AST 15 - 41 U/L 20  ALT 17 - 63 U/L 19  Alk Phosphatase 38 - 126 U/L 90  Total Bilirubin 0.3 - 1.2 mg/dL 1.4(H)   PT/INR No results for input(s): LABPROT, INR in the last 72 hours. ABG No results for input(s): PHART, HCO3 in the last 72 hours.  Invalid input(s): PCO2, PO2  Studies/Results:  Anti-infectives: Anti-infectives (From admission, onward)   Start     Dose/Rate Route Frequency Ordered Stop   07/14/17 2300  piperacillin-tazobactam (ZOSYN) IVPB 3.375 g  Status:  Discontinued     3.375 g 12.5 mL/hr over 240  Minutes Intravenous Every 8 hours 07/13/17 1930 07/14/17 0858   07/14/17 0900  [MAR Hold]  piperacillin-tazobactam (ZOSYN) IVPB 3.375 g     (MAR Hold since Fri 07/15/2017 at 1217. Reason: Transfer to a Procedural area.)   3.375 g 12.5 mL/hr over 240 Minutes Intravenous Every 8 hours 07/14/17 0858     07/14/17 0445  [MAR Hold]  vancomycin (VANCOCIN) IVPB 750 mg/150 ml premix     (MAR Hold since Fri 07/15/2017 at 1217. Reason: Transfer to a Procedural area.)   750 mg 150 mL/hr over 60 Minutes Intravenous Every 8 hours 07/14/17 0431     07/13/17 1900  piperacillin-tazobactam (ZOSYN) IVPB 3.375 g  Status:  Discontinued     3.375 g 12.5 mL/hr over 240 Minutes Intravenous Every 8 hours 07/13/17 1847 07/13/17 1930   07/13/17 1530  piperacillin-tazobactam (ZOSYN) IVPB 3.375 g     3.375 g 100 mL/hr over 30 Minutes Intravenous  Once 07/13/17 1515 07/13/17 1636      Medications: Scheduled Meds: . [MAR Hold] enoxaparin (LOVENOX) injection  40 mg Subcutaneous Q24H   Continuous Infusions: . dextrose 5 % and 0.45 % NaCl with KCl 20 mEq/L 1 mL (07/15/17 0909)  . [MAR Hold] famotidine (PEPCID) IV Stopped (07/15/17 4854)  . lactated ringers 10 mL/hr at 07/15/17 1226  . [MAR Hold] piperacillin-tazobactam (ZOSYN)  IV 3.375 g (07/15/17 0902)  . [MAR Hold] vancomycin Stopped (07/15/17 1610)   PRN Meds:.[MAR Hold] acetaminophen **OR** [MAR Hold] acetaminophen, [MAR Hold] diphenhydrAMINE **OR** [MAR Hold] diphenhydrAMINE, [MAR Hold]  morphine injection, [MAR Hold] ondansetron **OR** [MAR Hold] ondansetron (ZOFRAN) IV, [MAR Hold] promethazine, [MAR Hold] zolpidem  Assessment/Plan: Patient Active Problem List   Diagnosis Date Noted  . Intra-abdominal abscess ([place]) 07/13/2017   Intra-abdominal abscess with possible small bowel fistula  Repeat CT yesterday showed evidence of enlarging intra-abdominal abscess with probable fistulous connection to small bowel.  Surrounding transverse colon was also  inflamed.  With his wife on speaker phone I had an extensive conversation with the patient and his wife regarding hospitalization course to date including new CT findings.  The etiology of this abscess with probable small bowel fistulization is definitely unclear.  Radiology suggests this could be inflammatory bowel disease.  However the patient gives no history of typical inflammatory bowel disease symptoms.  His only GI history was a remote GI bleed 3 years ago.  Nonetheless he has worsening abdominal pain, enlarging abscess on CT with question of connection to small bowel.  I do not really see any other option other than exploratory laparotomy.  I do not believe a percutaneous drain is appropriate in this situation.  We discussed the pros and cons of that.  I also informed the patient that I discussed his case and imaging with several of my surgical partners.  I recommended diagnostic laparoscopy with exploratory laparotomy with probable small bowel resection.  I advised the patient that I probably would not be able to do much laparoscopically  We discussed that he could have extensive adhesions between the surrounding abscess cavity and the surrounding bowel including the transverse colon I explained that he is at increased risk for enterotomy, potential need for bowel resection.  I am hopeful that this is a little bit further down the small bowel and not necessary close to the ligament of Treitz.  We did discuss that if this is a proximal small bowel issue that it is higher risk for complications postoperatively.  We discussed the typical risk of a major laparotomy including but not limited to bleeding, infection, injury to surrounding structures, anastomotic stricture, anastomotic leak, fistula formation, prolonged ileus, incisional hernia, dehiscence, need for additional procedures, blood clot formation, perioperative cardiac and pulmonary events as well as the typical recovery.  I showed the patient  his imaging as well.  He is amendable to the plan.  All their questions were asked and answered.  He received Lovenox last night Continue IV antibiotics  Plan diagnostic laparoscopy, possible exploratory laparotomy possible bowel resection later this afternoon  Disposition:  LOS: 2 days    Leighton Ruff. Redmond Pulling, MD, FACS General, Bariatric, & Minimally Invasive Surgery [phone] [facility]

## 2017-07-16 ENCOUNTER — Encounter (HOSPITAL_COMMUNITY): Payer: Self-pay | Admitting: General Surgery

## 2017-07-16 LAB — CBC
HCT: 39.6 % (ref 39.0–52.0)
Hemoglobin: 13.2 g/dL (ref 13.0–17.0)
MCH: 31.4 pg (ref 26.0–34.0)
MCHC: 33.3 g/dL (ref 30.0–36.0)
MCV: 94.1 fL (ref 78.0–100.0)
Platelets: 351 10*3/uL (ref 150–400)
RBC: 4.21 MIL/uL — ABNORMAL LOW (ref 4.22–5.81)
RDW: 13.3 % (ref 11.5–15.5)
WBC: 11.8 10*3/uL — ABNORMAL HIGH (ref 4.0–10.5)

## 2017-07-16 LAB — BASIC METABOLIC PANEL
Anion gap: 7 (ref 5–15)
Anion gap: 10 (ref 5–15)
Anion gap: 13 (ref 5–15)
BUN: 9 mg/dL (ref 6–20)
BUN: 12 mg/dL (ref 6–20)
BUN: 16 mg/dL (ref 6–20)
CO2: 27 mmol/L (ref 22–32)
CO2: 25 mmol/L (ref 22–32)
CO2: 19 mmol/L — ABNORMAL LOW (ref 22–32)
Calcium: 8.1 mg/dL — ABNORMAL LOW (ref 8.9–10.3)
Calcium: 8.3 mg/dL — ABNORMAL LOW (ref 8.9–10.3)
Calcium: 7.8 mg/dL — ABNORMAL LOW (ref 8.9–10.3)
Chloride: 107 mmol/L (ref 101–111)
Chloride: 105 mmol/L (ref 101–111)
Chloride: 105 mmol/L (ref 101–111)
Creatinine, Ser: 3.09 mg/dL — ABNORMAL HIGH (ref 0.61–1.24)
Creatinine, Ser: 3.81 mg/dL — ABNORMAL HIGH (ref 0.61–1.24)
Creatinine, Ser: 4.72 mg/dL — ABNORMAL HIGH (ref 0.61–1.24)
GFR calc Af Amer: 29 mL/min — ABNORMAL LOW (ref >60)
GFR calc Af Amer: 22 mL/min — ABNORMAL LOW (ref >60)
GFR calc Af Amer: 17 mL/min — ABNORMAL LOW (ref >60)
GFR calc non Af Amer: 25 mL/min — ABNORMAL LOW (ref >60)
GFR calc non Af Amer: 19 mL/min — ABNORMAL LOW (ref >60)
GFR calc non Af Amer: 15 mL/min — ABNORMAL LOW (ref >60)
Glucose, Bld: 187 mg/dL — ABNORMAL HIGH (ref 65–99)
Glucose, Bld: 152 mg/dL — ABNORMAL HIGH (ref 65–99)
Glucose, Bld: 133 mg/dL — ABNORMAL HIGH (ref 65–99)
Potassium: 5.9 mmol/L — ABNORMAL HIGH (ref 3.5–5.1)
Potassium: 5.5 mmol/L — ABNORMAL HIGH (ref 3.5–5.1)
Potassium: 5 mmol/L (ref 3.5–5.1)
Sodium: 141 mmol/L (ref 135–145)
Sodium: 140 mmol/L (ref 135–145)
Sodium: 137 mmol/L (ref 135–145)

## 2017-07-16 LAB — OSMOLALITY, URINE: Osmolality, Ur: 290 mOsm/kg — ABNORMAL LOW (ref 300–900)

## 2017-07-16 MED ORDER — FAMOTIDINE IN NACL 20-0.9 MG/50ML-% IV SOLN
20.0000 mg | INTRAVENOUS | Status: DC
  Administered 2017-07-17 – 2017-07-25 (×9): 20 mg via INTRAVENOUS
  Filled 2017-07-16 (×9): qty 50

## 2017-07-16 MED ORDER — METHOCARBAMOL 1000 MG/10ML IJ SOLN
500.0000 mg | Freq: Three times a day (TID) | INTRAVENOUS | Status: DC
  Administered 2017-07-16 – 2017-07-25 (×26): 500 mg via INTRAVENOUS
  Filled 2017-07-16 (×18): qty 5
  Filled 2017-07-16: qty 550
  Filled 2017-07-16 (×5): qty 5
  Filled 2017-07-16: qty 550
  Filled 2017-07-16 (×6): qty 5

## 2017-07-16 MED ORDER — ENOXAPARIN SODIUM 30 MG/0.3ML ~~LOC~~ SOLN
30.0000 mg | SUBCUTANEOUS | Status: DC
  Administered 2017-07-17 – 2017-07-24 (×8): 30 mg via SUBCUTANEOUS
  Filled 2017-07-16 (×9): qty 0.3

## 2017-07-16 MED ORDER — ACETAMINOPHEN 10 MG/ML IV SOLN
1000.0000 mg | Freq: Four times a day (QID) | INTRAVENOUS | Status: AC
  Administered 2017-07-16 – 2017-07-17 (×4): 1000 mg via INTRAVENOUS
  Filled 2017-07-16 (×3): qty 100

## 2017-07-16 MED ORDER — FENTANYL CITRATE (PF) 100 MCG/2ML IJ SOLN
50.0000 ug | INTRAMUSCULAR | Status: DC | PRN
  Administered 2017-07-16: 100 ug via INTRAVENOUS
  Administered 2017-07-16: 50 ug via INTRAVENOUS
  Administered 2017-07-16: 75 ug via INTRAVENOUS
  Administered 2017-07-17: 100 ug via INTRAVENOUS
  Filled 2017-07-16 (×4): qty 2

## 2017-07-16 MED ORDER — CHLORHEXIDINE GLUCONATE CLOTH 2 % EX PADS
6.0000 | MEDICATED_PAD | Freq: Every day | CUTANEOUS | Status: AC
  Administered 2017-07-16 – 2017-07-20 (×4): 6 via TOPICAL

## 2017-07-16 MED ORDER — INSULIN ASPART 100 UNIT/ML IV SOLN
10.0000 [IU] | Freq: Once | INTRAVENOUS | Status: AC
  Administered 2017-07-16: 10 [IU] via INTRAVENOUS
  Filled 2017-07-16: qty 0.1

## 2017-07-16 MED ORDER — ALBUMIN HUMAN 5 % IV SOLN
12.5000 g | Freq: Once | INTRAVENOUS | Status: AC
  Administered 2017-07-16: 12.5 g via INTRAVENOUS
  Filled 2017-07-16: qty 250

## 2017-07-16 MED ORDER — PIPERACILLIN-TAZOBACTAM IN DEX 2-0.25 GM/50ML IV SOLN
2.2500 g | Freq: Three times a day (TID) | INTRAVENOUS | Status: DC
  Administered 2017-07-16 – 2017-07-29 (×37): 2.25 g via INTRAVENOUS
  Filled 2017-07-16 (×41): qty 50

## 2017-07-16 MED ORDER — SODIUM CHLORIDE 0.9 % IV SOLN
INTRAVENOUS | Status: DC
  Administered 2017-07-16 – 2017-07-18 (×4): via INTRAVENOUS

## 2017-07-16 MED ORDER — MUPIROCIN 2 % EX OINT
1.0000 "application " | TOPICAL_OINTMENT | Freq: Two times a day (BID) | CUTANEOUS | Status: AC
  Administered 2017-07-16 – 2017-07-20 (×9): 1 via NASAL
  Filled 2017-07-16 (×3): qty 22

## 2017-07-16 MED ORDER — DEXTROSE 50 % IV SOLN
1.0000 | Freq: Once | INTRAVENOUS | Status: AC
  Administered 2017-07-16: 50 mL via INTRAVENOUS
  Filled 2017-07-16: qty 50

## 2017-07-16 MED ORDER — SODIUM CHLORIDE 0.9 % IV BOLUS
1000.0000 mL | Freq: Once | INTRAVENOUS | Status: AC
  Administered 2017-07-16: 1000 mL via INTRAVENOUS

## 2017-07-16 NOTE — Progress Notes (Signed)
Notified Dr  Kieth Brightly  Of decreased urine output. New order recieved

## 2017-07-16 NOTE — Progress Notes (Signed)
Discontinued dilaudid PCA, wasted 66ml in the sink with [employer]

## 2017-07-16 NOTE — Progress Notes (Addendum)
[facility] Surgery Progress Note  1 Day Post-Op  Subjective: CC:  Abdomen feels tight, pain relieved by IV tylenol and PCA. Denies CP or palpitations. Reports trouble taking a deep breath 2/2 abd distention. Concerned about UOP. Denies flatus or BM.   Denies previous history of any kidney injury - has two cousins who required kidney transplants for primary kidney conditions.  Objective: Vital signs in last 24 hours: Temp:  [97.5 F (36.4 C)-102.6 F (39.2 C)] 97.5 F (36.4 C) (05/11 0600) Pulse Rate:  [93-131] 108 (05/11 0500) Resp:  [12-30] 30 (05/11 0817) BP: (114-150)/(62-95) 141/94 (05/11 0500) SpO2:  [93 %-100 %] 93 % (05/11 0817) Weight:  [73.9 kg (163 lb)] 73.9 kg (163 lb) (05/10 1224) Last BM Date: 07/15/17  Intake/Output from previous day: 05/10 0701 - 05/11 0700 In: 5435 [I.V.:3675; NG/GT:60; IV Piggyback:1700] Out: 765 [Urine:330; Emesis/NG output:50; Drains:285; Blood:100] Intake/Output this shift: No intake/output data recorded.  PE: Gen:  Alert, NAD, pleasant and cooperative  Card:  Sinus tachycardia 111 bpm during my exam Pulm:  Normal effort, clear to auscultation bilaterally Abd: Soft, appropriately tender, Vac in place, JP drain with SS drainage, No BS  NGT - 50 cc/24h   JP - 285 cc/24h SS GU: foley in place, urine is clear/yellow. No gross hematuria.  Skin: warm and dry, no rashes  Psych: A&Ox3   Lab Results:  Recent Labs    07/15/17 0513 07/16/17 0448  WBC 12.2* 11.8*  HGB 12.9* 13.2  HCT 37.8* 39.6  PLT 286 351   BMET Recent Labs    07/15/17 0513 07/16/17 0448  NA 140 141  K 4.0 5.9*  CL 102 107  CO2 28 27  GLUCOSE 138* 187*  BUN <5* 9  CREATININE 1.16 3.09*  CALCIUM 9.1 8.1*   PT/INR No results for input(s): LABPROT, INR in the last 72 hours. CMP     Component Value Date/Time   NA 141 07/16/2017 0448   K 5.9 (H) 07/16/2017 0448   CL 107 07/16/2017 0448   CO2 27 07/16/2017 0448   GLUCOSE 187 (H) 07/16/2017 0448    BUN 9 07/16/2017 0448   CREATININE 3.09 (H) 07/16/2017 0448   CALCIUM 8.1 (L) 07/16/2017 0448   PROT 7.2 07/13/2017 1304   ALBUMIN 3.8 07/13/2017 1304   AST 20 07/13/2017 1304   ALT 19 07/13/2017 1304   ALKPHOS 90 07/13/2017 1304   BILITOT 1.4 (H) 07/13/2017 1304   GFRNONAA 25 (L) 07/16/2017 0448   GFRAA 29 (L) 07/16/2017 0448   Lipase     Component Value Date/Time   LIPASE 26 07/13/2017 1304       Studies/Results: Ct Abdomen Pelvis W Contrast  Result Date: 07/14/2017 CLINICAL DATA:  Diffuse abdominal pain. Known abscess seen on CT yesterday. EXAM: CT ABDOMEN AND PELVIS WITH CONTRAST TECHNIQUE: Multidetector CT imaging of the abdomen and pelvis was performed using the standard protocol following bolus administration of intravenous contrast. CONTRAST:  167mL OMNIPAQUE IOHEXOL 300 MG/ML  SOLN COMPARISON:  07/13/2017 FINDINGS: Lower chest: Clear lung bases.  Heart is normal in size. Hepatobiliary: No focal liver abnormality is seen. No gallstones, gallbladder wall thickening, or biliary dilatation. Pancreas: Unremarkable. No pancreatic ductal dilatation or surrounding inflammatory changes. Spleen: Normal in size without focal abnormality. Adrenals/Urinary Tract: Adrenal glands are unremarkable. Kidneys are normal, without renal calculi, focal lesion, or hydronephrosis. Bladder is unremarkable. Stomach/Bowel: There is an abscess that abuts the posterior inferior margin of the transverse colon and is in direct continuity  to a loop of small bowel. The abscess contains fluid and air. It measures 7.8 x 4.5 x 7.0 cm, measuring 7.4 x 4.3 x 5.1 cm yesterday. There is a tract of fluid with adjacent inflammation extends from the abscess to a loop of small bowel that extends along the posterior margin of the abscess. This is consistent with a fistula. This loop of small bowel is abnormal showing wall and fold thickening. Hazy inflammatory change surrounds the abscess and the adjacent small bowel. The  stomach is unremarkable. No other areas of bowel wall thickening or inflammation. Normal appendix visualized. Vascular/Lymphatic: There are subcentimeter mesenteric lymph nodes, prominent, adjacent to the inflamed bowel an abscess. No pathologically enlarged lymph nodes. No vascular abnormality. Reproductive: Unremarkable Other: Trace amount of ascites.  No abdominal wall hernia. Musculoskeletal: No acute or significant osseous findings. IMPRESSION: 1. The previously described central abdominal abscess is larger, 7.8 x 4.5 x 7.0 cm, previously 7.4 x 4.3 x 5.1 cm. The abscess appears to arise from an abnormal loop of small bowel, which is directly contiguous, from a fistula. This appearance suggests Crohn's disease as the etiology. There is also wall thickening of the colon, which has increased from the previous day's study, which is likely reactive to the adjacent abscess and inflammatory change. 2. No free air.  No other abscesses. 3. Trace amount of ascites. Electronically Signed   By: Lajean Manes M.D.   On: 07/14/2017 17:21    Anti-infectives: Anti-infectives (From admission, onward)   Start     Dose/Rate Route Frequency Ordered Stop   07/15/17 1445  vancomycin (VANCOCIN) IVPB 750 mg/150 ml premix  Status:  Discontinued     750 mg 150 mL/hr over 60 Minutes Intravenous To Surgery 07/15/17 1437 07/15/17 2041   07/14/17 2300  piperacillin-tazobactam (ZOSYN) IVPB 3.375 g  Status:  Discontinued     3.375 g 12.5 mL/hr over 240 Minutes Intravenous Every 8 hours 07/13/17 1930 07/14/17 0858   07/14/17 0900  piperacillin-tazobactam (ZOSYN) IVPB 3.375 g     3.375 g 12.5 mL/hr over 240 Minutes Intravenous Every 8 hours 07/14/17 0858     07/14/17 0445  vancomycin (VANCOCIN) IVPB 750 mg/150 ml premix     750 mg 150 mL/hr over 60 Minutes Intravenous Every 8 hours 07/14/17 0431     07/13/17 1900  piperacillin-tazobactam (ZOSYN) IVPB 3.375 g  Status:  Discontinued     3.375 g 12.5 mL/hr over 240 Minutes  Intravenous Every 8 hours 07/13/17 1847 07/13/17 1930   07/13/17 1530  piperacillin-tazobactam (ZOSYN) IVPB 3.375 g     3.375 g 100 mL/hr over 30 Minutes Intravenous  Once 07/13/17 1515 07/13/17 1636     Assessment/Plan Perforated proximal jejunum with intra-abdominal abscess POD#1 S/P EXPLORATORY LAPAROTOMY PROXIMAL SMALL BOWEL RESECTION, DRAINAGE OF ABDOMINAL ABSCESS (N/A), LAPAROSCOPY DIAGNOSTIC (N/A),  APPLICATION OF WOUND VAC Dr. Redmond Pulling 5/10 - afebrile, sinus tachycardia  - continue NG tube to LIWS and await further bowel function - continue JP drain - pain control with IV tylenol, IV robaxin, PCA - mobilize, IS  Tachycardia- improving post-operatively, continue tele and monitor  AKI - Creatinine 3.09 today; D/C vanc, IVF at 125 cc/hr, give insulin/dextrose, repeat labs at 1100 today and in AM; renal consult pending  FEN - NPO, NS @ 125 cc/hr, hyperkalemia (5.9) - change IVF as noted and order EKG.  ID - Vanc 5/9-5/11 Zosyn 5/9 >>  VTE - SCD's, Lovenox Foley - continue for strict I&Os    LOS: 3 days  Jill Alexanders , [facility] 07/16/2017, 8:48 AM Pager: [phone] Consults: [phone] Mon-Fri 7:00 am-4:30 pm Sat-Sun 7:00 am-11:30 am

## 2017-07-16 NOTE — Progress Notes (Signed)
Patient received from PACU alert and oriented x4. Wound vac dressing intact, NGT in place. Foley catheter in place.  Vital signs stable.  Wife at bedside.

## 2017-07-16 NOTE — Plan of Care (Signed)
  Problem: Activity: Goal: Risk for activity intolerance will decrease Outcome: Progressing   Problem: Pain Managment: Goal: General experience of comfort will improve Outcome: Progressing   Problem: Clinical Measurements: Goal: Postoperative complications will be avoided or minimized Outcome: Progressing   

## 2017-07-16 NOTE — Consult Note (Signed)
[facility] Consult Note     Date: 07/16/2017                  Patient Name:  Joe Morris  MRN: [id number]  DOB: [birth year]  Age / Sex: 37 y.o., male         PCP: Lennie Odor, PA-C                 Service Requesting Consult: Surgery                 Reason for Consult: AKI             Chief Complaint: abdominal pain  HPI: Patient is a 37 yo male who presented with abdominal pain due to an intraabdominal abscess. He underwent a exploratory laparotomy on 07/15/17 and was found to have a perforated proximal jejunum with intraabdominal abscess requiring drainage and proximal small bowel resection. He was started on IV vanc/zosyn on admission and post op was found to have creatinine increased to 3.8 from baseline ~1. He was also found to have a K of 5.9.  Past Medical History:  Diagnosis Date  . Anemia 07/2014  . Gastrointestinal hemorrhage with melena    [name] 07/26/2014 [facility])  . GERD (gastroesophageal reflux disease)   . Intra-abdominal abscess ([place])    [name] 07/13/2017    Past Surgical History:  Procedure Laterality Date  . COLONOSCOPY WITH ESOPHAGOGASTRODUODENOSCOPY (EGD)  07/2014   "@ [facility]"  . LAPAROSCOPY N/A 07/15/2017   Procedure: LAPAROSCOPY DIAGNOSTIC;  Surgeon: Greer Pickerel, MD;  Location: [facility];  Service: General;  Laterality: N/A;  . LAPAROTOMY N/A 07/15/2017   Procedure: EXPLORATORY LAPAROTOMY PROXIMAL SMALL BOWEL RESECTION, DRAINAGE OF ABDOMINAL ABSCESS;  Surgeon: Greer Pickerel, MD;  Location: [facility];  Service: General;  Laterality: N/A;    History reviewed. No pertinent family history. Social History:  reports that he has quit smoking. He has never used smokeless tobacco. He reports that he drinks about 4.2 oz of alcohol per week. He reports that he does not use drugs.  Allergies: No Known Allergies  Medications Prior to Admission  Medication Sig Dispense Refill  . omeprazole (PRILOSEC) 20 MG capsule Take 20 mg by mouth as needed (Heartburn).       Results for orders placed or performed during the hospital encounter of 07/13/17 (from the past 48 hour(s))  CBC     Status: Abnormal   Collection Time: 07/15/17  5:13 AM  Result Value Ref Range   WBC 12.2 (H) 4.0 - 10.5 K/uL   RBC 4.09 (L) 4.22 - 5.81 MIL/uL   Hemoglobin 12.9 (L) 13.0 - 17.0 g/dL   HCT 37.8 (L) 39.0 - 52.0 %   MCV 92.4 78.0 - 100.0 fL   MCH 31.5 26.0 - 34.0 pg   MCHC 34.1 30.0 - 36.0 g/dL   RDW 13.0 11.5 - 15.5 %   Platelets 286 150 - 400 K/uL    Comment: Performed at [facility] [address] [place], [place] [address]  Basic metabolic panel     Status: Abnormal   Collection Time: 07/15/17  5:13 AM  Result Value Ref Range   Sodium 140 135 - 145 mmol/L   Potassium 4.0 3.5 - 5.1 mmol/L   Chloride 102 101 - 111 mmol/L   CO2 28 22 - 32 mmol/L   Glucose, Bld 138 (H) 65 - 99 mg/dL   BUN <5 (L) 6 - 20 mg/dL   Creatinine, Ser  1.16 0.61 - 1.24 mg/dL   Calcium 9.1 8.9 - 10.3 mg/dL   GFR calc non Af Amer >60 >60 mL/min   GFR calc Af Amer >60 >60 mL/min    Comment: (NOTE) The eGFR has been calculated using the CKD EPI equation. This calculation has not been validated in all clinical situations. eGFR's persistently <60 mL/min signify possible Chronic Kidney Disease.    Anion gap 10 5 - 15    Comment: Performed at [facility] [address], [place], [place] [address]  Surgical PCR screen     Status: Abnormal   Collection Time: 07/15/17 10:53 AM  Result Value Ref Range   MRSA, PCR NEGATIVE NEGATIVE   Staphylococcus aureus POSITIVE (A) NEGATIVE    Comment: (NOTE) The Xpert SA Assay (FDA approved for NASAL specimens in patients 40 years of age and older), is one component of a comprehensive surveillance program. It is not intended to diagnose infection nor to guide or monitor treatment. Performed at [facility], [place] [address] [place], [place] [address]   CBC     Status: Abnormal   Collection Time: 07/16/17  4:48 AM  Result Value  Ref Range   WBC 11.8 (H) 4.0 - 10.5 K/uL   RBC 4.21 (L) 4.22 - 5.81 MIL/uL   Hemoglobin 13.2 13.0 - 17.0 g/dL   HCT 39.6 39.0 - 52.0 %   MCV 94.1 78.0 - 100.0 fL   MCH 31.4 26.0 - 34.0 pg   MCHC 33.3 30.0 - 36.0 g/dL   RDW 13.3 11.5 - 15.5 %   Platelets 351 150 - 400 K/uL    Comment: Performed at [facility], [place] [address] [place], [place] [address]  Basic metabolic panel     Status: Abnormal   Collection Time: 07/16/17  4:48 AM  Result Value Ref Range   Sodium 141 135 - 145 mmol/L   Potassium 5.9 (H) 3.5 - 5.1 mmol/L   Chloride 107 101 - 111 mmol/L   CO2 27 22 - 32 mmol/L   Glucose, Bld 187 (H) 65 - 99 mg/dL   BUN 9 6 - 20 mg/dL   Creatinine, Ser 3.09 (H) 0.61 - 1.24 mg/dL    Comment: DELTA CHECK NOTED   Calcium 8.1 (L) 8.9 - 10.3 mg/dL   GFR calc non Af Amer 25 (L) >60 mL/min   GFR calc Af Amer 29 (L) >60 mL/min    Comment: (NOTE) The eGFR has been calculated using the CKD EPI equation. This calculation has not been validated in all clinical situations. eGFR's persistently <60 mL/min signify possible Chronic Kidney Disease.    Anion gap 7 5 - 15    Comment: Performed at [facility] [address] [place], [place] [address]   Ct Abdomen Pelvis W Contrast  Result Date: 07/14/2017 CLINICAL DATA:  Diffuse abdominal pain. Known abscess seen on CT yesterday. EXAM: CT ABDOMEN AND PELVIS WITH CONTRAST TECHNIQUE: Multidetector CT imaging of the abdomen and pelvis was performed using the standard protocol following bolus administration of intravenous contrast. CONTRAST:  120m OMNIPAQUE IOHEXOL 300 MG/ML  SOLN COMPARISON:  07/13/2017 FINDINGS: Lower chest: Clear lung bases.  Heart is normal in size. Hepatobiliary: No focal liver abnormality is seen. No gallstones, gallbladder wall thickening, or biliary dilatation. Pancreas: Unremarkable. No pancreatic ductal dilatation or surrounding inflammatory changes. Spleen: Normal in size without focal abnormality. Adrenals/Urinary  Tract: Adrenal glands are unremarkable. Kidneys are normal, without renal calculi, focal lesion,  or hydronephrosis. Bladder is unremarkable. Stomach/Bowel: There is an abscess that abuts the posterior inferior margin of the transverse colon and is in direct continuity to a loop of small bowel. The abscess contains fluid and air. It measures 7.8 x 4.5 x 7.0 cm, measuring 7.4 x 4.3 x 5.1 cm yesterday. There is a tract of fluid with adjacent inflammation extends from the abscess to a loop of small bowel that extends along the posterior margin of the abscess. This is consistent with a fistula. This loop of small bowel is abnormal showing wall and fold thickening. Hazy inflammatory change surrounds the abscess and the adjacent small bowel. The stomach is unremarkable. No other areas of bowel wall thickening or inflammation. Normal appendix visualized. Vascular/Lymphatic: There are subcentimeter mesenteric lymph nodes, prominent, adjacent to the inflamed bowel an abscess. No pathologically enlarged lymph nodes. No vascular abnormality. Reproductive: Unremarkable Other: Trace amount of ascites.  No abdominal wall hernia. Musculoskeletal: No acute or significant osseous findings. IMPRESSION: 1. The previously described central abdominal abscess is larger, 7.8 x 4.5 x 7.0 cm, previously 7.4 x 4.3 x 5.1 cm. The abscess appears to arise from an abnormal loop of small bowel, which is directly contiguous, from a fistula. This appearance suggests Crohn's disease as the etiology. There is also wall thickening of the colon, which has increased from the previous day's study, which is likely reactive to the adjacent abscess and inflammatory change. 2. No free air.  No other abscesses. 3. Trace amount of ascites. Electronically Signed   By: Lajean Manes M.D.   On: 07/14/2017 17:21    Review of Systems  Constitutional: Negative for chills and fever.  Respiratory: Negative for shortness of breath.   Cardiovascular: Negative for  chest pain.  Gastrointestinal: Positive for abdominal pain and constipation. Negative for blood in stool, diarrhea, nausea and vomiting.  Genitourinary: Negative for dysuria, frequency and urgency.    Blood pressure (!) 151/94, pulse (!) 114, temperature 99.1 F (37.3 C), temperature source Oral, resp. rate (!) 26, height 5' 7"  (1.702 m), weight 163 lb (73.9 kg), SpO2 98 %. Physical Exam  Constitutional: He is oriented to person, place, and time. He appears well-developed and well-nourished. No distress.  HENT:  Head: Normocephalic and atraumatic.  NG tube in place  Eyes: Conjunctivae and EOM are normal.  Neck: Normal range of motion.  Cardiovascular: Normal rate, regular rhythm, normal heart sounds and intact distal pulses.  No murmur heard. Respiratory: Effort normal and breath sounds normal. No respiratory distress. He has no wheezes. He has no rales.  GI:  Soft, nondistended, hypoactive bowel sounds. Wound vac in place with JP drain  Musculoskeletal: Normal range of motion. He exhibits no edema.  Neurological: He is alert and oriented to person, place, and time.  Skin: Skin is warm and dry. He is not diaphoretic.  Psychiatric: He has a normal mood and affect.     Assessment/Plan Patient is a 37 yo male who presented with abdominal pain due to an intraabdominal abscess s/p drainage via exploratory laparotomy with proximal small bowel resection on 07/15/17. Now with AKI with Cr elevated to 3.8 from baseline ~1-1.2 likely from IV vancomycin, now off, and hyperkalemia 5.9 from LR fluids during surgery.  1. AKI likely ATN from vancomycin as patient did have any hypotension. Vancomycin stopped, avoid nephrotoxic agents. Monitor Cr and UOP. 2. Hyperkalemia from LR fluids during surgery, now on NS for MIVF with improved K to 5.5. Monitor  3. Perforated proximal jejunum with  intra-abdominal abscess s/p ex lap with proximal small bowel obstruction. On zosyn. Management per surgery.  Bufford Lope, DO PGY-2, [facility] 07/16/2017 11:03 AM

## 2017-07-16 NOTE — Op Note (Signed)
NAME: WASSIM, KIRKSEY MEDICAL RECORD GY:[id number] ACCOUNT [id number] DATE OF BIRTH:August 25, 1980 FACILITY: [facility] LOCATION: [facility] PHYSICIAN:Creedence Kunesh Ronnie Derby, MD  OPERATIVE REPORT  DATE OF PROCEDURE:  07/15/2017  PREOPERATIVE DIAGNOSIS:  Intraabdominal abscess.  POSTOPERATIVE DIAGNOSIS:  Perforated proximal jejunum with intraabdominal abscess.  PROCEDURE: 1.  Diagnostic laparoscopy. 2.  Exploratory laparotomy. 3.  Proximal small bowel resection. 4.  Drainage of intraabdominal abscess.  SURGEON:  Leighton Ruff. Redmond Pulling, MD  ASSISTANTS:  Romana Juniper, MD; Margie Billet, PA-C  ANESTHESIA:  General.  ESTIMATED BLOOD LOSS:  100.  SPECIMEN:  Proximal jejunum, stitch marks proximal margin.  DRAINS:  A round 19-French in the left upper quadrant below the transverse colon near the small bowel anastomosis.  FINDINGS:  The patient had findings similar to his CT scan.  He had a large intraabdominal walled-off abscess sitting between the inferior portion of the midtransverse colon and the proximal jejunum about 15 cm from the ligament of Treitz.  There was a  large amount of pus in this abscess cavity.  The surrounding transverse colon was extremely indurated, along with the surrounding omentum.  There was a focal perforation in the jejunum with significant surrounding induration on the mesenteric side of the  bowel wall.  There was no other evidence of small bowel pathology; namely, small bowel diverticulum or evidence of Crohn disease.  The remainder of the small bowel was normal in appearance.  INDICATIONS FOR PROCEDURE:  The patient is a 37 year old gentleman who is otherwise healthy.  He developed fever and chills and rigors on Saturday, followed by abdominal pain, mainly in the periumbilical to upper midline region.  He developed anorexia.   He continued to have intermittent fever and chills, and his wife made him come to the emergency room on Wednesday.  He was found to have a white count.  A CT  scan demonstrated an intraabdominal abscess essentially sandwiched between the inferior portion  of the transverse colon and the proximal jejunum.  There was also a little bit of a blush there.  He was hemodynamically stable without any signs of low hemoglobin.  It was an atypical presentation.  He denied any inflammatory bowel-type history and  denied any trauma.  He was admitted and placed on IV antibiotics.  After discussing his case with radiology and several additional surgeons from our practice, we decided to repeat a CT scan with oral contrast.  This was performed, demonstrating again an  enlarging intraabdominal abscess with surrounding significant inflammation involving the transverse colon and proximal jejunum; however, there appeared to be a fistulous connection to the proximal small bowel.  Based on this and the fact that his  abdominal pain was worsening, I recommended going to the operating room.  We discussed a diagnostic laparoscopy; however, I advised the patient that we would probably not be able to do a lot given the large abscess and its proximity to the ligament of  Treitz.  We discussed in extensive fashion the high likelihood of needing to resect a small bowel, injury to surrounding structures given how close the abscess cavity involved the transverse colon, anastomotic leak, anastomotic stricture, intraabdominal  abscess, incisional hernia, fascial dehiscence, prolonged ileus, fistula formation, blood clot formation, perioperative cardiac and pulmonary events, multisystem organ failure and death.  He elected to proceed with surgery.  DESCRIPTION OF PROCEDURE:  After obtaining informed consent, the patient was brought down to the operating room and placed supine on the operating room table.  General endotracheal anesthesia was established.  Sequential  compression devices were placed.   A Foley catheter was placed.  His abdomen was prepped and draped in the usual standard surgical  fashion.  He was on broad-spectrum scheduled IV antibiotics.  A surgical timeout was performed.  A small supraumbilical incision was made with an 11 blade.   Subcutaneous tissue was spread.  Fascia was incised, and the abdominal cavity was entered.  I could palpate a bulge with my finger.  A pursestring suture was placed around the fascial edges and a Hasson trocar was placed.  Pneumoperitoneum was  established up to a patient pressure of 15 mmHg without any change in vital signs.  On CT, the abscess started at the level of the umbilicus and went upward.  Therefore, I placed an additional 5 mm trocar in the left and right lower abdomen under direct  visualization.  I was then able to visualize the omentum stuck to a dilated section of the proximal small bowel.  The omentum was very indurated.  When I started to peel the omentum off this dilated thickened section of small bowel, there was frank  drainage of purulent material, probably about 30-40 mL.  It appeared the transverse colon directly behind this omentum was severely indurated as well.  I felt that I had done as much as I could with the laparoscope.  The pneumoperitoneum was released.   Dr. Kae Heller had joined me in the operating room.  The incision was extended superiorly toward the xiphoid for a few inches, and the Hasson trocar was removed.  Fascia was incised, and abdominal cavity was entered further.  A wound protector was placed.   We were able to lift out the transverse colon.  The omentum was severely indurated, and the abscess cavity was evident with severely thickened indurated omentum and inferior portion of the transverse colon wall.  We identified the ligament of Treitz, and  about 15 cm downstream from this in the area of indurated jejunum was what appeared to be a focal perforation on the mesenteric side of the bowel wall.  I ran the remainder of the small bowel, and there was no other abnormal pathology.  There was no  small bowel  diverticulum.  There was no creeping fat.  The terminal ileum and cecum were normal.  The descending colon and sigmoid colon appeared grossly normal.  I decided to resect that proximal jejunum.  I created a small window in the mesentery just  next to the bowel wall about an inch above the area of perforation and normal-appearing bowel.  I did not want to go too more proximal because of our already close proximity to the ligament of Treitz.  I divided that with a GIA stapler with a 75 mm blue  load.  In a similar fashion, I went downstream to normal-appearing jejunum and again divided the bowel with another fire of the GIA-75 stapler with a blue load.  We then took down the mesentery in sequential fashion using the LigaSure device.  We  inspected the transverse colon.  There did not appear to be a colotomy, just the area was severely indurated.  The surrounding mucosa was very friable and oozy.  At this point, we irrigated the abdomen with approximately 4.5 L of saline.  I did not  really want to irrigate after doing this high proximal anastomosis, potentially putting undue tension on it.  At this point, we looked at the proximal jejunum.  It appeared that we identified the ligament of Treitz.  It was approximately 8-10 cm from the  ligament of Treitz to the staple line.  We took down some of the avascular attachments around the ligament of Treitz and mobilized the jejunum and distal D4 until visualizing the inferior mesenteric vein.  At that point, we did not do any more  mobilization.  We were able to achieve an additional probably 2 cm by doing all this.  We were then able to line up the proximal jejunum with the distal jejunum in a side-to-side fashion.  A 3-0 silk suture was placed as a stay suture along the  midportion of the staple line.  A corner of each staple line was excised to create an enterotomy in the proximal bowel and in the distal jejunum.  One limb of a GIA-75 stapler was placed through each  enterotomy.  The stapler was brought together and  fired to create a common channel.  A 3-0 silk suture was placed in the crotch of the anastomosis.  Because of the angle, I did not use a TA stapler to close the defect in the bowel.  I used interrupted 3-0 silk sutures.  I then imbricated the closure  with 3-0 silk sutures as a 2-layer closure.  The mesenteric defect was then closed with several interrupted 2-0 silk sutures in a figure-of-eight fashion.  I debulked most of the indurated omentum using the LigaSure device.  The transected edge of the  omentum was still oozing and indurated, so several 2-0 silk sutures were used to oversew this for hemostasis.  We elected to leave a drain.  It was brought out through the left lower quadrant trocar site and secured to the skin with a 2-0 nylon.  The  drain was placed inferior to the transverse colon where the area was still very indurated with still some chronically indurated omentum that was just too close to the transverse colon to excise.  I then infiltrated Exparel diluted with Marcaine along the  fascial edges as well as in the preperitoneal space in a circumferential manner.  We confirmed placement of the NG tube in the stomach.  The fascia was closed with #1 looped PDS, 1 from above, 1 from below, and tied centrally.  Because of the  contamination of the abdomen, the skin was left open.  I placed a wound VAC in the soft tissue.  The right trocar site had been removed, and the skin was closed with a 4-0 Monocryl in a subcuticular fashion, followed by application of a Steri-Strip.  All  needle, instrument and sponge counts were correct x2.  There were no immediate complications.  The patient tolerated the procedure well.  LN/NUANCE  D:07/15/2017 T:07/16/2017 JOB:[id number]

## 2017-07-16 NOTE — Progress Notes (Signed)
PHARMACY NOTE:  ANTIMICROBIAL RENAL DOSAGE ADJUSTMENT  Current antimicrobial regimen includes a mismatch between antimicrobial dosage and estimated renal function.  As per policy approved by the Pharmacy & Therapeutics and Medical Executive Committees, the antimicrobial dosage will be adjusted accordingly.  Current antimicrobial dosage:  Zosyn 3.375g IV q8h - 4hr infusion  Indication: Intra-abdominal abscess  Renal Function:  Estimated Creatinine Clearance: 25.5 mL/min (A) (by C-G formula based on SCr of 3.81 mg/dL (H)). []      On intermittent HD, scheduled: []      On CRRT    Antimicrobial dosage has been changed to:  Zosyn 2.25g IV q8h   Additional comments: Normally can use 3.375g Zosyn down to CrCl ~20 however, due to sharp rise in SCr and severity of AKI will adjust down. Also adjusted Lovenox dose and Pepcid dose.    Thank you for allowing pharmacy to be a part of this patient's care.  Brain Hilts, [facility] 07/16/2017 12:18 PM

## 2017-07-17 ENCOUNTER — Inpatient Hospital Stay (HOSPITAL_COMMUNITY): Payer: 59

## 2017-07-17 DIAGNOSIS — N179 Acute kidney failure, unspecified: Secondary | ICD-10-CM

## 2017-07-17 DIAGNOSIS — K631 Perforation of intestine (nontraumatic): Secondary | ICD-10-CM

## 2017-07-17 LAB — URINALYSIS, ROUTINE W REFLEX MICROSCOPIC
Bacteria, UA: NONE SEEN
Bilirubin Urine: NEGATIVE
Glucose, UA: NEGATIVE mg/dL
Ketones, ur: NEGATIVE mg/dL
Leukocytes, UA: NEGATIVE
Nitrite: NEGATIVE
Protein, ur: 100 mg/dL — AB
Specific Gravity, Urine: 1.016 (ref 1.005–1.030)
pH: 5 (ref 5.0–8.0)

## 2017-07-17 LAB — CBC
HCT: 31.8 % — ABNORMAL LOW (ref 39.0–52.0)
Hemoglobin: 10.6 g/dL — ABNORMAL LOW (ref 13.0–17.0)
MCH: 31.4 pg (ref 26.0–34.0)
MCHC: 33.3 g/dL (ref 30.0–36.0)
MCV: 94.1 fL (ref 78.0–100.0)
Platelets: 332 10*3/uL (ref 150–400)
RBC: 3.38 MIL/uL — ABNORMAL LOW (ref 4.22–5.81)
RDW: 13.4 % (ref 11.5–15.5)
WBC: 16 10*3/uL — ABNORMAL HIGH (ref 4.0–10.5)

## 2017-07-17 LAB — RENAL FUNCTION PANEL
Albumin: 2.3 g/dL — ABNORMAL LOW (ref 3.5–5.0)
Anion gap: 10 (ref 5–15)
BUN: 23 mg/dL — ABNORMAL HIGH (ref 6–20)
CO2: 20 mmol/L — ABNORMAL LOW (ref 22–32)
Calcium: 8.4 mg/dL — ABNORMAL LOW (ref 8.9–10.3)
Chloride: 111 mmol/L (ref 101–111)
Creatinine, Ser: 6.34 mg/dL — ABNORMAL HIGH (ref 0.61–1.24)
GFR calc Af Amer: 12 mL/min — ABNORMAL LOW (ref >60)
GFR calc non Af Amer: 10 mL/min — ABNORMAL LOW (ref >60)
Glucose, Bld: 130 mg/dL — ABNORMAL HIGH (ref 65–99)
Phosphorus: 5.1 mg/dL — ABNORMAL HIGH (ref 2.5–4.6)
Potassium: 5.4 mmol/L — ABNORMAL HIGH (ref 3.5–5.1)
Sodium: 141 mmol/L (ref 135–145)

## 2017-07-17 LAB — BASIC METABOLIC PANEL
Anion gap: 12 (ref 5–15)
BUN: 18 mg/dL (ref 6–20)
CO2: 21 mmol/L — ABNORMAL LOW (ref 22–32)
Calcium: 8 mg/dL — ABNORMAL LOW (ref 8.9–10.3)
Chloride: 109 mmol/L (ref 101–111)
Creatinine, Ser: 5.49 mg/dL — ABNORMAL HIGH (ref 0.61–1.24)
GFR calc Af Amer: 14 mL/min — ABNORMAL LOW (ref >60)
GFR calc non Af Amer: 12 mL/min — ABNORMAL LOW (ref >60)
Glucose, Bld: 134 mg/dL — ABNORMAL HIGH (ref 65–99)
Potassium: 5.5 mmol/L — ABNORMAL HIGH (ref 3.5–5.1)
Sodium: 142 mmol/L (ref 135–145)

## 2017-07-17 MED ORDER — FUROSEMIDE 10 MG/ML IJ SOLN
40.0000 mg | Freq: Once | INTRAMUSCULAR | Status: AC
  Administered 2017-07-17: 40 mg via INTRAVENOUS
  Filled 2017-07-17: qty 4

## 2017-07-17 MED ORDER — INSULIN ASPART 100 UNIT/ML IV SOLN
10.0000 [IU] | Freq: Once | INTRAVENOUS | Status: AC
  Administered 2017-07-17: 10 [IU] via INTRAVENOUS

## 2017-07-17 MED ORDER — DEXTROSE 50 % IV SOLN
50.0000 mL | Freq: Once | INTRAVENOUS | Status: AC
  Administered 2017-07-17: 50 mL via INTRAVENOUS
  Filled 2017-07-17: qty 50

## 2017-07-17 MED ORDER — ONDANSETRON HCL 4 MG/2ML IJ SOLN
4.0000 mg | Freq: Four times a day (QID) | INTRAMUSCULAR | Status: DC | PRN
  Administered 2017-07-17 – 2017-07-18 (×5): 4 mg via INTRAVENOUS
  Filled 2017-07-17 (×5): qty 2

## 2017-07-17 MED ORDER — FENTANYL CITRATE (PF) 100 MCG/2ML IJ SOLN
50.0000 ug | INTRAMUSCULAR | Status: DC | PRN
  Administered 2017-07-17: 75 ug via INTRAVENOUS
  Administered 2017-07-17: 100 ug via INTRAVENOUS
  Administered 2017-07-17 (×2): 75 ug via INTRAVENOUS
  Administered 2017-07-17: 100 ug via INTRAVENOUS
  Administered 2017-07-18: 75 ug via INTRAVENOUS
  Administered 2017-07-18: 50 ug via INTRAVENOUS
  Administered 2017-07-18: 75 ug via INTRAVENOUS
  Administered 2017-07-18: 100 ug via INTRAVENOUS
  Administered 2017-07-18 (×3): 50 ug via INTRAVENOUS
  Administered 2017-07-18: 75 ug via INTRAVENOUS
  Administered 2017-07-18: 100 ug via INTRAVENOUS
  Administered 2017-07-18: 50 ug via INTRAVENOUS
  Administered 2017-07-19 (×2): 100 ug via INTRAVENOUS
  Administered 2017-07-19: 75 ug via INTRAVENOUS
  Administered 2017-07-19: 100 ug via INTRAVENOUS
  Administered 2017-07-19: 50 ug via INTRAVENOUS
  Administered 2017-07-19 (×2): 75 ug via INTRAVENOUS
  Administered 2017-07-19 – 2017-07-21 (×12): 100 ug via INTRAVENOUS
  Administered 2017-07-22: 50 ug via INTRAVENOUS
  Administered 2017-07-22 – 2017-07-23 (×8): 100 ug via INTRAVENOUS
  Filled 2017-07-17 (×44): qty 2

## 2017-07-17 NOTE — Progress Notes (Addendum)
Joe Morris   Subjective:   Interval History  Feels slightly nauseated this morning   Brief History   37 yo male who presented with abdominal pain due to an intraabdominal abscess. He underwent a exploratory laparotomy on 07/15/17 and was found to have a perforated proximal jejunum with intraabdominal abscess requiring drainage and proximal small bowel resection. He was started on IV vanc/zosyn on admission, he also underwent contrast enhanced studies x 2 5/9   and post op was found to have creatinine increased to 3.8 from baseline ~1. He was also found to have a K of 5.9   He continues to make urine     Objective:  Vital signs in last 24 hours:  Temp:  [97.6 F (36.4 C)-100.3 F (37.9 C)] 97.6 F (36.4 C) (05/12 0754) Pulse Rate:  [89-111] 101 (05/12 0754) Resp:  [16-28] 18 (05/12 0754) BP: (126-153)/(87-106) 126/87 (05/12 0754) SpO2:  [95 %-100 %] 95 % (05/12 0754) Weight:  [176 lb 5.9 oz (80 kg)] 176 lb 5.9 oz (80 kg) (05/11 2257)  Weight change: 13 lb 5.9 oz (6.064 kg) Filed Weights   07/13/17 1848 07/15/17 1224 07/16/17 2257  Weight: 163 lb (73.9 kg) 163 lb (73.9 kg) 176 lb 5.9 oz (80 kg)    Intake/Output: I/O last 3 completed shifts: In: 6805 [P.O.:300; I.V.:4125; NG/GT:60; IV Piggyback:2320] Out: 5366 [Urine:975; Emesis/NG output:100; Drains:400]   Intake/Output this shift:  Total I/O In: 475 [I.V.:375; IV Piggyback:100] Out: 70 [Emesis/NG output:50; Drains:20]  Awake and alert  CVS- RRR no murmurs  RS- CTA no rales  ABD-  Bowels some rumblings  Wound vac   NG tube EXT- no edema   Basic Metabolic Panel: Recent Labs  Lab 07/15/17 0513 07/16/17 0448 07/16/17 1028 07/16/17 1958 07/17/17 0303  NA 140 141 140 137 142  K 4.0 5.9* 5.5* 5.0 5.5*  CL 102 107 105 105 109  CO2 28 27 25  19* 21*  GLUCOSE 138* 187* 152* 133* 134*  BUN <5* 9 12 16 18   CREATININE 1.16 3.09* 3.81* 4.72* 5.49*  CALCIUM 9.1 8.1* 8.3* 7.8* 8.0*    Liver  Function Tests: Recent Labs  Lab 07/13/17 1304  AST 20  ALT 19  ALKPHOS 90  BILITOT 1.4*  PROT 7.2  ALBUMIN 3.8   Recent Labs  Lab 07/13/17 1304  LIPASE 26   No results for input(s): AMMONIA in the last 168 hours.  CBC: Recent Labs  Lab 07/13/17 1304 07/14/17 0709 07/15/17 0513 07/16/17 0448 07/17/17 0303  WBC 11.8* 9.9 12.2* 11.8* 16.0*  HGB 15.3 13.1 12.9* 13.2 10.6*  HCT 42.4 38.3* 37.8* 39.6 31.8*  MCV 92.0 92.3 92.4 94.1 94.1  PLT 258 252 286 351 332    Cardiac Enzymes: No results for input(s): CKTOTAL, CKMB, CKMBINDEX, TROPONINI in the last 168 hours.  BNP: Invalid input(s): POCBNP  CBG: No results for input(s): GLUCAP in the last 168 hours.  Microbiology: Results for orders placed or performed during the hospital encounter of 07/13/17  Blood culture (routine x 2)     Status: None (Preliminary result)   Collection Time: 07/13/17  3:35 PM  Result Value Ref Range Status   Specimen Description BLOOD RIGHT ANTECUBITAL  Final   Special Requests   Final    BOTTLES DRAWN AEROBIC AND ANAEROBIC Blood Culture results may not be optimal due to an excessive volume of blood received in culture bottles   Culture   Final    NO GROWTH 4  DAYS Performed at [facility], [place] [address] [place], [place] [address]    Report Status PENDING  Incomplete  Blood culture (routine x 2)     Status: None (Preliminary result)   Collection Time: 07/13/17  4:05 PM  Result Value Ref Range Status   Specimen Description BLOOD LEFT ANTECUBITAL  Final   Special Requests   Final    BOTTLES DRAWN AEROBIC AND ANAEROBIC Blood Culture adequate volume   Culture   Final    NO GROWTH 4 DAYS Performed at [facility], [place] [address] [place], [place] [address]    Report Status PENDING  Incomplete  Surgical PCR screen     Status: Abnormal   Collection Time: 07/15/17 10:53 AM  Result Value Ref Range Status   MRSA, PCR NEGATIVE NEGATIVE Final   Staphylococcus aureus POSITIVE  (A) NEGATIVE Final    Comment: (Morris) The Xpert SA Assay (FDA approved for NASAL specimens in patients 70 years of age and older), is one component of a comprehensive surveillance program. It is not intended to diagnose infection nor to guide or monitor treatment. Performed at [facility], [place] [address], [place],  [address]     Coagulation Studies: No results for input(s): LABPROT, INR in the last 72 hours.  Urinalysis: No results for input(s): COLORURINE, LABSPEC, PHURINE, GLUCOSEU, HGBUR, BILIRUBINUR, KETONESUR, PROTEINUR, UROBILINOGEN, NITRITE, LEUKOCYTESUR in the last 72 hours.  Invalid input(s): APPERANCEUR    Imaging: No results found.   Medications:   . sodium chloride 125 mL/hr at 07/17/17 0240  . famotidine (PEPCID) IV Stopped (07/17/17 0911)  . methocarbamol (ROBAXIN)  IV Stopped (07/17/17 0528)  . piperacillin-tazobactam (ZOSYN)  IV Stopped (07/17/17 0841)   . Chlorhexidine Gluconate Cloth  6 each Topical Daily  . enoxaparin (LOVENOX) injection  30 mg Subcutaneous Q24H  . mupirocin ointment  1 application Nasal BID   fentaNYL (SUBLIMAZE) injection, ondansetron (ZOFRAN) IV, ondansetron **OR** [DISCONTINUED] ondansetron (ZOFRAN) IV, promethazine, zolpidem  Assessment/ Plan:   Acute Kidney Injury s/p exploratory laparotomy proximal small bowel most likely has some ATN related to vancomycin, dehydration and volume depletion and contrast  Hyperkalemia  K has increased to 5.5   Received insulin and glucose  Bicarbonate still a little low and will start IV bicarbonate  Will recheck potassium later today   Antibiotics  Continues on zosyn   Volume  Appears to have been adequately volume resuscitated will try dose of lasix 40 mg IV to see if we can promote diuresis    LOS: 4 Joe Morris @TODAY @12 :11 PM

## 2017-07-17 NOTE — Significant Event (Addendum)
Rapid Response Event Note  Overview: Condition Help  Initial Focused Assessment: Condition H initiated by ICU RNs. When I arrived, patient was tachycardiac, febrile, endorses mild respiratory distress. Hands and feet were very cool to touch, skin was cool/clammy/diaphoretic. Heart and lung sounds were clear.  HR 100-120s, SBP 150s, temp was 100.3 oral, RR 25-28, sats > 98% on 4LNC. Patient endorsed abdominal pain, per patient has remained the same throughout the day. Abdominal site looks clean and intact, wound vac in place, and JP drain - serous drainage, NGT to LIWS.  + AKI ( 25cc/hr UO past few hours), hyperkalemia (treated earlier with D50 and IV Insulin).   Interventions: -- NS bolus 250 x 2 (500cc)  -- Albumin x 1 250cc -- STAT BMP  -- PIV x 2 started  -- Continue MIVF 125cc/hr -- Dilaudid PCA d/c and Fentanyl IV ordered  Plan of Care (if not transferred): -- Updated MD on call, PCA was d/c, orders for Albumin x 1, will transfer patient to SDU for closer monitoring. -- After fluids, patient did he felt better, did have a temperature of 100.2 around 2300, was given APAP, patient is on Zosyn. At [place], I came by for a follow up, HR improved, 90-100s, SBP improved to 130s.   Event Summary:   at    Call Time [place] End Time 2140  Marti Acebo, Delice Lesch

## 2017-07-17 NOTE — Progress Notes (Signed)
1935 As 1st shift Rn giving report pt voiced his concern that there is no follow up lab works since this Am. Paged Dr Georgette Dover and Md ordered for Stat Bmet. Urine out is 125 from 4pm-8pm. Still tachycardic at 111, RR 23 with SPO2 of 98%/4l via [place]. RR Rn came and seen pt. Initiate to give IV bolus as per order. Lab works came back with K+ w/in Normal level but Crea came up from 3.81 to 4.72. 9Pm Pt transferred to 2W16 for close monitoring.

## 2017-07-17 NOTE — Progress Notes (Signed)
Progress Note: General Surgery Service   Assessment/Plan: Patient Active Problem List   Diagnosis Date Noted  . Acute kidney injury ([place]) 07/17/2017  . Perforation of small intestine ([place]) 07/17/2017  . Intra-abdominal abscess ([place]) 07/13/2017   s/p Procedure(s): EXPLORATORY LAPAROTOMY PROXIMAL SMALL BOWEL RESECTION, DRAINAGE OF ABDOMINAL ABSCESS LAPAROSCOPY DIAGNOSTIC 07/15/2017 -continue NG, ok with sips/ice -continue vac today -ambulate -follow up nephrology recs   LOS: 4 days  Chief Complaint/Subjective: Pain improved and less shaky since stopping pca. No nausea, feels some gas movement  Objective: Vital signs in last 24 hours: Temp:  [97.6 F (36.4 C)-100.3 F (37.9 C)] 97.6 F (36.4 C) (05/12 0754) Pulse Rate:  [89-114] 101 (05/12 0754) Resp:  [16-28] 18 (05/12 0754) BP: (126-153)/(87-106) 126/87 (05/12 0754) SpO2:  [95 %-100 %] 95 % (05/12 0754) Weight:  [80 kg (176 lb 5.9 oz)] 80 kg (176 lb 5.9 oz) (05/11 2257) Last BM Date: 07/15/17  Intake/Output from previous day: 05/11 0701 - 05/12 0700 In: 3920 [P.O.:300; I.V.:2750; IV Piggyback:870] Out: 7026 [Urine:725; Emesis/NG output:50; Drains:290] Intake/Output this shift: No intake/output data recorded.  Cardiovascular: tachycardic  Abd: vac in place, minimal pain while sitting, drain with serous output, nondistended  Extremities: no edema  Neuro: AOx4  Lab Results: CBC  Recent Labs    07/16/17 0448 07/17/17 0303  WBC 11.8* 16.0*  HGB 13.2 10.6*  HCT 39.6 31.8*  PLT 351 332   BMET Recent Labs    07/16/17 1958 07/17/17 0303  NA 137 142  K 5.0 5.5*  CL 105 109  CO2 19* 21*  GLUCOSE 133* 134*  BUN 16 18  CREATININE 4.72* 5.49*  CALCIUM 7.8* 8.0*   PT/INR No results for input(s): LABPROT, INR in the last 72 hours. ABG No results for input(s): PHART, HCO3 in the last 72 hours.  Invalid input(s): PCO2, PO2  Studies/Results:  Anti-infectives: Anti-infectives (From admission, onward)   Start     Dose/Rate Route Frequency Ordered Stop   07/16/17 1600  piperacillin-tazobactam (ZOSYN) IVPB 2.25 g     2.25 g 100 mL/hr over 30 Minutes Intravenous Every 8 hours 07/16/17 1213     07/15/17 1445  vancomycin (VANCOCIN) IVPB 750 mg/150 ml premix  Status:  Discontinued     750 mg 150 mL/hr over 60 Minutes Intravenous To Surgery 07/15/17 1437 07/15/17 2041   07/14/17 2300  piperacillin-tazobactam (ZOSYN) IVPB 3.375 g  Status:  Discontinued     3.375 g 12.5 mL/hr over 240 Minutes Intravenous Every 8 hours 07/13/17 1930 07/14/17 0858   07/14/17 0900  piperacillin-tazobactam (ZOSYN) IVPB 3.375 g  Status:  Discontinued     3.375 g 12.5 mL/hr over 240 Minutes Intravenous Every 8 hours 07/14/17 0858 07/16/17 1213   07/14/17 0445  vancomycin (VANCOCIN) IVPB 750 mg/150 ml premix  Status:  Discontinued     750 mg 150 mL/hr over 60 Minutes Intravenous Every 8 hours 07/14/17 0431 07/16/17 0856   07/13/17 1900  piperacillin-tazobactam (ZOSYN) IVPB 3.375 g  Status:  Discontinued     3.375 g 12.5 mL/hr over 240 Minutes Intravenous Every 8 hours 07/13/17 1847 07/13/17 1930   07/13/17 1530  piperacillin-tazobactam (ZOSYN) IVPB 3.375 g     3.375 g 100 mL/hr over 30 Minutes Intravenous  Once 07/13/17 1515 07/13/17 1636      Medications: Scheduled Meds: . Chlorhexidine Gluconate Cloth  6 each Topical Daily  . enoxaparin (LOVENOX) injection  30 mg Subcutaneous Q24H  . mupirocin ointment  1 application Nasal BID  Continuous Infusions: . sodium chloride 125 mL/hr at 07/17/17 0240  . famotidine (PEPCID) IV    . methocarbamol (ROBAXIN)  IV Stopped (07/17/17 0528)  . piperacillin-tazobactam (ZOSYN)  IV 2.25 g (07/17/17 0754)   PRN Meds:.fentaNYL (SUBLIMAZE) injection, ondansetron **OR** [DISCONTINUED] ondansetron (ZOFRAN) IV, promethazine, zolpidem  Mickeal Skinner, MD Pg# [phone] [facility]

## 2017-07-18 LAB — CBC
HCT: 30.2 % — ABNORMAL LOW (ref 39.0–52.0)
Hemoglobin: 10.1 g/dL — ABNORMAL LOW (ref 13.0–17.0)
MCH: 31.4 pg (ref 26.0–34.0)
MCHC: 33.4 g/dL (ref 30.0–36.0)
MCV: 93.8 fL (ref 78.0–100.0)
Platelets: 440 10*3/uL — ABNORMAL HIGH (ref 150–400)
RBC: 3.22 MIL/uL — ABNORMAL LOW (ref 4.22–5.81)
RDW: 13.9 % (ref 11.5–15.5)
WBC: 23.9 10*3/uL — ABNORMAL HIGH (ref 4.0–10.5)

## 2017-07-18 LAB — RENAL FUNCTION PANEL
Albumin: 2.2 g/dL — ABNORMAL LOW (ref 3.5–5.0)
Anion gap: 11 (ref 5–15)
BUN: 31 mg/dL — ABNORMAL HIGH (ref 6–20)
CO2: 18 mmol/L — ABNORMAL LOW (ref 22–32)
Calcium: 8.4 mg/dL — ABNORMAL LOW (ref 8.9–10.3)
Chloride: 111 mmol/L (ref 101–111)
Creatinine, Ser: 7.68 mg/dL — ABNORMAL HIGH (ref 0.61–1.24)
GFR calc Af Amer: 10 mL/min — ABNORMAL LOW (ref >60)
GFR calc non Af Amer: 8 mL/min — ABNORMAL LOW (ref >60)
Glucose, Bld: 122 mg/dL — ABNORMAL HIGH (ref 65–99)
Phosphorus: 6.2 mg/dL — ABNORMAL HIGH (ref 2.5–4.6)
Potassium: 5 mmol/L (ref 3.5–5.1)
Sodium: 140 mmol/L (ref 135–145)

## 2017-07-18 LAB — CULTURE, BLOOD (ROUTINE X 2)
Culture: NO GROWTH
Culture: NO GROWTH
Special Requests: ADEQUATE

## 2017-07-18 MED ORDER — ONDANSETRON HCL 4 MG/2ML IJ SOLN
4.0000 mg | INTRAMUSCULAR | Status: DC | PRN
  Administered 2017-07-18 – 2017-08-07 (×20): 4 mg via INTRAVENOUS
  Filled 2017-07-18 (×21): qty 2

## 2017-07-18 MED ORDER — METOCLOPRAMIDE HCL 5 MG/ML IJ SOLN
10.0000 mg | Freq: Four times a day (QID) | INTRAMUSCULAR | Status: DC | PRN
  Filled 2017-07-18: qty 2

## 2017-07-18 MED ORDER — SODIUM BICARBONATE 8.4 % IV SOLN
INTRAVENOUS | Status: DC
  Administered 2017-07-18 – 2017-07-19 (×2): via INTRAVENOUS
  Filled 2017-07-18 (×6): qty 150

## 2017-07-18 MED ORDER — METOCLOPRAMIDE HCL 5 MG/ML IJ SOLN: 10.0000 mg | Freq: Four times a day (QID) | INTRAMUSCULAR | Status: DC | PRN

## 2017-07-18 NOTE — Consult Note (Signed)
[place] Nurse wound consult note Reason for Consult: Consult requested to change abd Vac dressing with surgical PA at the bedside to assess the wound during the first post-op dressing change. Wound type: Full thickness post-op midline abd wound Measurement: 9X1X.8cm Wound bed: red and moist Drainage (amount, consistency, odor) small amt pink drainage, no odor Periwound: intact skin surrounding Dressing procedure/placement/frequency: Applied one narrow piece of black foam to 141mm cont suction.  Pt was medicated for pain prior to the procedure and tolerated with minimal amt discomfort.  Plan for bedside nurse to change Q M/W/F.  Discussed plan of care with patient. Surgical team will continue to follow for assessment and plan of care. Please re-consult if further assistance is needed.  Thank-you,  Julien Girt MSN, [place], [place], [place], [place]

## 2017-07-18 NOTE — Care Management Note (Addendum)
Case Management Note  Patient Details  Name: Joe Morris MRN: [id number] Date of Birth: [birth year]  Subjective/Objective:   From home with wife, AKI s/p exploratory lap small bowell, wbc elevated, cretanine elevated. conts on iv aabx, iv pain meds, bicarb drip, NG tube, plan home when stable. Per RN patient will not go home with wound vac.                 Action/Plan: NCM will follow for dc needs.  Expected Discharge Date:                  Expected Discharge Plan:  Home/Self Care  In-House Referral:     Discharge planning Services  CM Consult  Post Acute Care Choice:    Choice offered to:     DME Arranged:    DME Agency:     HH Arranged:    HH Agency:     Status of Service:  In process, will continue to follow  If discussed at Long Length of Stay Meetings, dates discussed:    Additional Comments:  Zenon Mayo, RN 07/18/2017, 12:11 PM

## 2017-07-18 NOTE — Progress Notes (Addendum)
Patient ID: Joe Morris, male   DOB: [birth year], 37 y.o.   MRN: [id number]    3 Days Post-Op  Subjective: Pt feels ok today.  Just had a BM and states it is melanotic.  Has edema of RUE (forearm).  Pulling 1000 on IS.  Ambulating and sitting up in a chair.  Objective: Vital signs in last 24 hours: Temp:  [97.8 F (36.6 C)-99.3 F (37.4 C)] 98.5 F (36.9 C) (05/13 0759) Pulse Rate:  [77-108] 77 (05/13 0759) Resp:  [16-25] 16 (05/13 0759) BP: (138-148)/(84-95) 144/95 (05/13 0759) SpO2:  [89 %-98 %] 97 % (05/13 0759) Last BM Date: 07/15/17  Intake/Output from previous day: 05/12 0701 - 05/13 0700 In: 3240 [P.O.:180; I.V.:2750; IV Piggyback:310] Out: 2220 [Urine:1650; Emesis/NG output:500; Drains:70] Intake/Output this shift: No intake/output data recorded.  PE: Gen: NAD Heart: regular Lungs: CTAB Abd: soft, appropriately tender, hypoactive BS, wound VAC in place.  NGT with some bilious, watered-down type output.  JP with serous drainage.    Ext: edema of both forearms, but greatest in right.  Edema in both feet, trace in legs.  Lab Results:  Recent Labs    07/16/17 0448 07/17/17 0303  WBC 11.8* 16.0*  HGB 13.2 10.6*  HCT 39.6 31.8*  PLT 351 332   BMET Recent Labs    07/17/17 1427 07/18/17 0510  NA 141 140  K 5.4* 5.0  CL 111 111  CO2 20* 18*  GLUCOSE 130* 122*  BUN 23* 31*  CREATININE 6.34* 7.68*  CALCIUM 8.4* 8.4*   PT/INR No results for input(s): LABPROT, INR in the last 72 hours. CMP     Component Value Date/Time   NA 140 07/18/2017 0510   K 5.0 07/18/2017 0510   CL 111 07/18/2017 0510   CO2 18 (L) 07/18/2017 0510   GLUCOSE 122 (H) 07/18/2017 0510   BUN 31 (H) 07/18/2017 0510   CREATININE 7.68 (H) 07/18/2017 0510   CALCIUM 8.4 (L) 07/18/2017 0510   PROT 7.2 07/13/2017 1304   ALBUMIN 2.2 (L) 07/18/2017 0510   AST 20 07/13/2017 1304   ALT 19 07/13/2017 1304   ALKPHOS 90 07/13/2017 1304   BILITOT 1.4 (H) 07/13/2017 1304   GFRNONAA 8 (L)  07/18/2017 0510   GFRAA 10 (L) 07/18/2017 0510   Lipase     Component Value Date/Time   LIPASE 26 07/13/2017 1304       Studies/Results: US Renal  Result Date: 07/17/2017 CLINICAL DATA:  37 year old male with renal failure. Abdominal abscess. EXAM: RENAL / URINARY TRACT ULTRASOUND COMPLETE COMPARISON:  CT Abdomen and Pelvis 07/14/2017, and earlier. FINDINGS: Right Kidney: Length: 11.3 centimeters. Echogenic right renal cortex (image 2). No right hydronephrosis or renal mass. Left Kidney: Length: 12.2 centimeters. Echogenic left renal cortex. No left hydronephrosis or left renal mass. Bladder: Diminutive, a Foley catheter balloon is visible. Other findings: A left pleural effusion is evident. Trace abdominal free fluid is visible adjacent to the liver. IMPRESSION: 1. No hydronephrosis or acute renal findings. 2. Abnormal bilateral renal cortical echogenicity. Differential considerations include chronic medical renal disease versus HIV nephropathy. 3. Left pleural effusion is new since 07/14/2017. Electronically Signed   By: Genevie Ann M.D.   On: 07/17/2017 22:21    Anti-infectives: Anti-infectives (From admission, onward)   Start     Dose/Rate Route Frequency Ordered Stop   07/16/17 1600  piperacillin-tazobactam (ZOSYN) IVPB 2.25 g     2.25 g 100 mL/hr over 30 Minutes Intravenous Every 8 hours 07/16/17 1213  07/15/17 1445  vancomycin (VANCOCIN) IVPB 750 mg/150 ml premix  Status:  Discontinued     750 mg 150 mL/hr over 60 Minutes Intravenous To Surgery 07/15/17 1437 07/15/17 2041   07/14/17 2300  piperacillin-tazobactam (ZOSYN) IVPB 3.375 g  Status:  Discontinued     3.375 g 12.5 mL/hr over 240 Minutes Intravenous Every 8 hours 07/13/17 1930 07/14/17 0858   07/14/17 0900  piperacillin-tazobactam (ZOSYN) IVPB 3.375 g  Status:  Discontinued     3.375 g 12.5 mL/hr over 240 Minutes Intravenous Every 8 hours 07/14/17 0858 07/16/17 1213   07/14/17 0445  vancomycin (VANCOCIN) IVPB 750 mg/150  ml premix  Status:  Discontinued     750 mg 150 mL/hr over 60 Minutes Intravenous Every 8 hours 07/14/17 0431 07/16/17 0856   07/13/17 1900  piperacillin-tazobactam (ZOSYN) IVPB 3.375 g  Status:  Discontinued     3.375 g 12.5 mL/hr over 240 Minutes Intravenous Every 8 hours 07/13/17 1847 07/13/17 1930   07/13/17 1530  piperacillin-tazobactam (ZOSYN) IVPB 3.375 g     3.375 g 100 mL/hr over 30 Minutes Intravenous  Once 07/13/17 1515 07/13/17 1636       Assessment/Plan Prior h/o GI bleed with no known cause, workup unrevealing  Complex intra-abdominal abscess POD 3, s/p SBR and drainage of intra-abdominal abscess -cont NGT despite BM today.  Has minimal BS.  Still with some NGT output.  Given this is a very proximal SBR, we will go quite slowly with his diet advancement. -ambulate and pulm toilet -WOC for first VAC change.   -cont zosyn at renal adjusted dose of 2.25mg  -WBC up to 16 yesterday.  CBC ordered for today to follow this as well as his hgb which dropped from 13->10.  Cbc in am as well  ARF -appreciate nephrology's assistance -cr up to 7.68 today.  neph starting on some bi-carb. Suspect fluid retention correlates with this problem.  IVFs turned down to 75cc/hr.  -Lasix was given yesterday, but hold today. -renal panel daily  ID -zosyn 5/8>> VTE -SCDs/Lovenox (30mg ) FEN -IVF, NPO/NGT Foley -in place for strict I&Os  Follow up -Dr. Redmond Pulling  Plan -follow cr.  Cont NGT and await better bowel function.     LOS: 5 days    Henreitta Cea , [facility] 07/18/2017, 9:07 AM Pager: [phone]

## 2017-07-18 NOTE — Progress Notes (Addendum)
MD paged, patient with dry heaves but no PRNs available until 1615. New order received.  MD paged, patient questions if earlier nausea with dry heaves is related to robaxin administration, requests to know if IV tylenol could be tried instead.  1827: on Call MD for surgery paged. Patient is not getting relief from current nausea medications.

## 2017-07-18 NOTE — Progress Notes (Signed)
[facility] ROUNDING NOTE   Subjective:   Interval History  Hemodynamically stable- afebrile- 1600 of UOP- which is an improvement after one dose of lasix- renal U/S good sized kidneys, no obs  Brief History   37 yo male  presented with abdominal pain due to an intraabdominal abscess. s/p exploratory laparotomy on 07/15/17 and was found to have a perforated proximal jejunum with intraabdominal abscess requiring drainage and proximal small bowel resection. He was started on IV vanc/zosyn on admission, he also underwent contrast enhanced studies x 2 5/9   AKI and hyperkalemia- nonoliguric     Objective:  Vital signs in last 24 hours:  Temp:  [97.6 F (36.4 C)-99.3 F (37.4 C)] 98.8 F (37.1 C) (05/13 0001) Pulse Rate:  [84-108] 87 (05/13 0400) Resp:  [17-25] 17 (05/13 0400) BP: (126-148)/(84-94) 138/94 (05/13 0001) SpO2:  [89 %-98 %] 97 % (05/13 0400)  Weight change:  Filed Weights   07/13/17 1848 07/15/17 1224 07/16/17 2257  Weight: 73.9 kg (163 lb) 73.9 kg (163 lb) 80 kg (176 lb 5.9 oz)    Intake/Output: I/O last 3 completed shifts: In: 6880 [P.O.:360; I.V.:5500; IV Piggyback:1020] Out: 2735 [Urine:2075; Emesis/NG output:550; Drains:110]   Intake/Output this shift:  No intake/output data recorded.  Awake and alert - NGT in place CVS- RRR no murmurs  RS- CTA no rales  ABD-  Bowels some rumblings  Wound vac   NG tube EXT- min  edema   Basic Metabolic Panel: Recent Labs  Lab 07/16/17 1028 07/16/17 1958 07/17/17 0303 07/17/17 1427 07/18/17 0510  NA 140 137 142 141 140  K 5.5* 5.0 5.5* 5.4* 5.0  CL 105 105 109 111 111  CO2 25 19* 21* 20* 18*  GLUCOSE 152* 133* 134* 130* 122*  BUN 12 16 18  23* 31*  CREATININE 3.81* 4.72* 5.49* 6.34* 7.68*  CALCIUM 8.3* 7.8* 8.0* 8.4* 8.4*  PHOS  --   --   --  5.1* 6.2*    Liver Function Tests: Recent Labs  Lab 07/13/17 1304 07/17/17 1427 07/18/17 0510  AST 20  --   --   ALT 19  --   --   ALKPHOS 90  --   --    BILITOT 1.4*  --   --   PROT 7.2  --   --   ALBUMIN 3.8 2.3* 2.2*   Recent Labs  Lab 07/13/17 1304  LIPASE 26   No results for input(s): AMMONIA in the last 168 hours.  CBC: Recent Labs  Lab 07/13/17 1304 07/14/17 0709 07/15/17 0513 07/16/17 0448 07/17/17 0303  WBC 11.8* 9.9 12.2* 11.8* 16.0*  HGB 15.3 13.1 12.9* 13.2 10.6*  HCT 42.4 38.3* 37.8* 39.6 31.8*  MCV 92.0 92.3 92.4 94.1 94.1  PLT 258 252 286 351 332    Cardiac Enzymes: No results for input(s): CKTOTAL, CKMB, CKMBINDEX, TROPONINI in the last 168 hours.  BNP: Invalid input(s): POCBNP  CBG: No results for input(s): GLUCAP in the last 168 hours.  Microbiology: Results for orders placed or performed during the hospital encounter of 07/13/17  Blood culture (routine x 2)     Status: None (Preliminary result)   Collection Time: 07/13/17  3:35 PM  Result Value Ref Range Status   Specimen Description BLOOD RIGHT ANTECUBITAL  Final   Special Requests   Final    BOTTLES DRAWN AEROBIC AND ANAEROBIC Blood Culture results may not be optimal due to an excessive volume of blood received in culture bottles  Culture   Final    NO GROWTH 4 DAYS Performed at [facility], [place] [address], [place], [place] [address]    Report Status PENDING  Incomplete  Blood culture (routine x 2)     Status: None (Preliminary result)   Collection Time: 07/13/17  4:05 PM  Result Value Ref Range Status   Specimen Description BLOOD LEFT ANTECUBITAL  Final   Special Requests   Final    BOTTLES DRAWN AEROBIC AND ANAEROBIC Blood Culture adequate volume   Culture   Final    NO GROWTH 4 DAYS Performed at [facility], [place] [address] [place], [place] [address]    Report Status PENDING  Incomplete  Surgical PCR screen     Status: Abnormal   Collection Time: 07/15/17 10:53 AM  Result Value Ref Range Status   MRSA, PCR NEGATIVE NEGATIVE Final   Staphylococcus aureus POSITIVE (A) NEGATIVE Final    Comment: (NOTE) The Xpert SA  Assay (FDA approved for NASAL specimens in patients 19 years of age and older), is one component of a comprehensive surveillance program. It is not intended to diagnose infection nor to guide or monitor treatment. Performed at [facility], [place] [address] [place], [place] [address]     Coagulation Studies: No results for input(s): LABPROT, INR in the last 72 hours.  Urinalysis: Recent Labs    07/17/17 1704  COLORURINE YELLOW  LABSPEC 1.016  PHURINE 5.0  GLUCOSEU NEGATIVE  HGBUR SMALL*  BILIRUBINUR NEGATIVE  KETONESUR NEGATIVE  PROTEINUR 100*  NITRITE NEGATIVE  LEUKOCYTESUR NEGATIVE      Imaging: US Renal  Result Date: 07/17/2017 CLINICAL DATA:  37 year old male with renal failure. Abdominal abscess. EXAM: RENAL / URINARY TRACT ULTRASOUND COMPLETE COMPARISON:  CT Abdomen and Pelvis 07/14/2017, and earlier. FINDINGS: Right Kidney: Length: 11.3 centimeters. Echogenic right renal cortex (image 2). No right hydronephrosis or renal mass. Left Kidney: Length: 12.2 centimeters. Echogenic left renal cortex. No left hydronephrosis or left renal mass. Bladder: Diminutive, a Foley catheter balloon is visible. Other findings: A left pleural effusion is evident. Trace abdominal free fluid is visible adjacent to the liver. IMPRESSION: 1. No hydronephrosis or acute renal findings. 2. Abnormal bilateral renal cortical echogenicity. Differential considerations include chronic medical renal disease versus HIV nephropathy. 3. Left pleural effusion is new since 07/14/2017. Electronically Signed   By: Genevie Ann M.D.   On: 07/17/2017 22:21     Medications:   . sodium chloride 125 mL/hr at 07/18/17 0303  . famotidine (PEPCID) IV Stopped (07/17/17 0911)  . methocarbamol (ROBAXIN)  IV Stopped (07/18/17 2130)  . piperacillin-tazobactam (ZOSYN)  IV Stopped (07/18/17 0048)   . Chlorhexidine Gluconate Cloth  6 each Topical Daily  . enoxaparin (LOVENOX) injection  30 mg Subcutaneous Q24H  .  mupirocin ointment  1 application Nasal BID   fentaNYL (SUBLIMAZE) injection, ondansetron (ZOFRAN) IV, ondansetron **OR** [DISCONTINUED] ondansetron (ZOFRAN) IV, promethazine, zolpidem  Assessment/ Plan:   Acute Kidney Injury s/p exploratory laparotomy proximal small bowel most likely has some ATN related to vancomycin, dehydration and volume depletion and contrast- nonoliguric but yet to see recovery - no absolute indications for RRT- difficult to tell uremic sxms but do not think are there.  He is young and had good baseline renal function so am still optimistic for renal recovery   Hyperkalemia  K stable  Bicarbonate still a little low - would benefit from bicarb- will order  Antibiotics  Continues on zosyn  Volume  Appears to have been adequately volume resuscitated - given lasix yest- will hold on further for now and see what his kidneys can do   Phos up- no need for action as of yet    LOS: 5 Yamil Oelke A @TODAY @7 :47 AM

## 2017-07-19 LAB — CBC
HCT: 28.2 % — ABNORMAL LOW (ref 39.0–52.0)
Hemoglobin: 9.5 g/dL — ABNORMAL LOW (ref 13.0–17.0)
MCH: 31.4 pg (ref 26.0–34.0)
MCHC: 33.7 g/dL (ref 30.0–36.0)
MCV: 93.1 fL (ref 78.0–100.0)
Platelets: 425 10*3/uL — ABNORMAL HIGH (ref 150–400)
RBC: 3.03 MIL/uL — ABNORMAL LOW (ref 4.22–5.81)
RDW: 14 % (ref 11.5–15.5)
WBC: 17 10*3/uL — ABNORMAL HIGH (ref 4.0–10.5)

## 2017-07-19 LAB — RENAL FUNCTION PANEL
Albumin: 2.2 g/dL — ABNORMAL LOW (ref 3.5–5.0)
Anion gap: 15 (ref 5–15)
BUN: 42 mg/dL — ABNORMAL HIGH (ref 6–20)
CO2: 19 mmol/L — ABNORMAL LOW (ref 22–32)
Calcium: 8.5 mg/dL — ABNORMAL LOW (ref 8.9–10.3)
Chloride: 107 mmol/L (ref 101–111)
Creatinine, Ser: 8.52 mg/dL — ABNORMAL HIGH (ref 0.61–1.24)
GFR calc Af Amer: 8 mL/min — ABNORMAL LOW (ref >60)
GFR calc non Af Amer: 7 mL/min — ABNORMAL LOW (ref >60)
Glucose, Bld: 136 mg/dL — ABNORMAL HIGH (ref 65–99)
Phosphorus: 6.3 mg/dL — ABNORMAL HIGH (ref 2.5–4.6)
Potassium: 4.3 mmol/L (ref 3.5–5.1)
Sodium: 141 mmol/L (ref 135–145)

## 2017-07-19 MED ORDER — HYDROMORPHONE HCL 2 MG/ML IJ SOLN
INTRAMUSCULAR | Status: AC
  Filled 2017-07-19: qty 1

## 2017-07-19 MED ORDER — PROCHLORPERAZINE EDISYLATE 10 MG/2ML IJ SOLN
10.0000 mg | Freq: Four times a day (QID) | INTRAMUSCULAR | Status: DC | PRN
  Administered 2017-07-19 – 2017-07-22 (×4): 10 mg via INTRAVENOUS
  Filled 2017-07-19 (×5): qty 2

## 2017-07-19 NOTE — Plan of Care (Signed)
Patient still NPO at this time

## 2017-07-19 NOTE — Progress Notes (Signed)
Nutrition Follow Up  DOCUMENTATION CODES:   Not applicable  INTERVENTION:    Advance PO diet vs TPN initiation. RD to continue to follow.  NUTRITION DIAGNOSIS:   Inadequate oral intake related to altered GI function as evidenced by NPO status, ongoing  GOAL:   Patient will meet greater than or equal to 90% of their needs, currently unmet  MONITOR:   Diet advancement, Labs, Weight trends, Skin, I & O's  ASSESSMENT:   Joe Morris is a 37yo male who presented to [facility] earlier today with 4 days of persistent abdominal pain, fevers, and chills.  Pre-op dx: intra-abdominal abscess.  Pt s/p procedures 5/10: EXPLORATORY LAPAROTOMY  PROXIMAL SMALL BOWEL RESECTION DRAINAGE OF ABDOMINAL ABSCESS  APPLICATION OF WOUND VAC  RD briefly visited with pt. He is in good spirits.  Has been NPO x 6 days since admission.  Noted pt had nausea & emesis this AM.   Labs reviewed. P 6.3 (H).  Medications reviewed. NGT in place to LIS.  Nutrition focused physical exam completed.   No muscle or subcutaneous fat depletion noticed.  Diet Order:   Diet Order           Diet NPO time specified Except for: Ice Chips, Other (See Comments)  Diet effective now         EDUCATION NEEDS:   No education needs have been identified at this time  Skin:  Skin Assessment: Skin Integrity Issues: Skin Integrity Issues:: Wound VAC Wound Vac: abdomen  Last BM:  5/13   Intake/Output Summary (Last 24 hours) at 07/19/2017 1413 Last data filed at 07/18/2017 2300 Gross per 24 hour  Intake -  Output 275 ml  Net -275 ml   Height:   Ht Readings from Last 1 Encounters:  07/16/17 5\' 7"  (1.702 m)   Weight:   Wt Readings from Last 1 Encounters:  07/16/17 176 lb 5.9 oz (80 kg)   Ideal Body Weight:  67.3 kg  BMI:  Body mass index is 27.62 kg/m.  Estimated Nutritional Needs:   Kcal:  2000-2200  Protein:  95-110 grams  Fluid:  2.0-2.2 L  Arthur Holms, RD, LDN Pager #:  [phone] After-Hours Pager #: [phone]

## 2017-07-19 NOTE — Progress Notes (Signed)
Subjective:  Only 600 of UOP recorded, crt up again - cont nausea and maybe a little weak  Objective Vital signs in last 24 hours: Vitals:   07/18/17 1953 07/18/17 2346 07/19/17 0433 07/19/17 0744  BP: 136/86 135/78 (!) 142/95 (!) 143/94  Pulse: 89 62 69 73  Resp: (!) 24 19 20 18   Temp: 98 F (36.7 C) 98.3 F (36.8 C) 97.6 F (36.4 C) 98 F (36.7 C)  TempSrc: Oral Oral Oral Oral  SpO2: 97% 99% 98% 99%  Weight:      Height:       Weight change:   Intake/Output Summary (Last 24 hours) at 07/19/2017 0837 Last data filed at 07/18/2017 2300 Gross per 24 hour  Intake 673.75 ml  Output 860 ml  Net -186.25 ml    Assessment/ Plan: Pt is a 37 y.o. yo male who was admitted with perf viscous, s/p surgical repair  on 07/13/2017 with  Now with AKI  Assessment/Plan: 1. AKI-  in the setting of abdominal surgery and contrast as well as vanc- urine pretty bland- U/S good sized kidneys- echogenic - crt pre insult was 1.24.  Nonoliguric- there have been no absolute indications for HD - still just watching and waiting- maybe rate of rise less- still hopeful for eventual recovery  2. HTN/vol- tank is full- nonoliguric- did respond to lasix but do not feel inclined to redose at this time  3. Anemia- situational- follow  4. Metabolic acidosis- stable on bicarb drip  5. Phos up but stable - no action    Joe Morris A    Labs: Basic Metabolic Panel: Recent Labs  Lab 07/17/17 1427 07/18/17 0510 07/19/17 0244  NA 141 140 141  K 5.4* 5.0 4.3  CL 111 111 107  CO2 20* 18* 19*  GLUCOSE 130* 122* 136*  BUN 23* 31* 42*  CREATININE 6.34* 7.68* 8.52*  CALCIUM 8.4* 8.4* 8.5*  PHOS 5.1* 6.2* 6.3*   Liver Function Tests: Recent Labs  Lab 07/13/17 1304 07/17/17 1427 07/18/17 0510 07/19/17 0244  AST 20  --   --   --   ALT 19  --   --   --   ALKPHOS 90  --   --   --   BILITOT 1.4*  --   --   --   PROT 7.2  --   --   --   ALBUMIN 3.8 2.3* 2.2* 2.2*   Recent Labs  Lab 07/13/17 1304   LIPASE 26   No results for input(s): AMMONIA in the last 168 hours. CBC: Recent Labs  Lab 07/15/17 0513 07/16/17 0448 07/17/17 0303 07/18/17 0932 07/19/17 0244  WBC 12.2* 11.8* 16.0* 23.9* 17.0*  HGB 12.9* 13.2 10.6* 10.1* 9.5*  HCT 37.8* 39.6 31.8* 30.2* 28.2*  MCV 92.4 94.1 94.1 93.8 93.1  PLT 286 351 332 440* 425*   Cardiac Enzymes: No results for input(s): CKTOTAL, CKMB, CKMBINDEX, TROPONINI in the last 168 hours. CBG: No results for input(s): GLUCAP in the last 168 hours.  Iron Studies: No results for input(s): IRON, TIBC, TRANSFERRIN, FERRITIN in the last 72 hours. Studies/Results: US Renal  Result Date: 07/17/2017 CLINICAL DATA:  37 year old male with renal failure. Abdominal abscess. EXAM: RENAL / URINARY TRACT ULTRASOUND COMPLETE COMPARISON:  CT Abdomen and Pelvis 07/14/2017, and earlier. FINDINGS: Right Kidney: Length: 11.3 centimeters. Echogenic right renal cortex (image 2). No right hydronephrosis or renal mass. Left Kidney: Length: 12.2 centimeters. Echogenic left renal cortex. No left hydronephrosis or left renal mass.  Bladder: Diminutive, a Foley catheter balloon is visible. Other findings: A left pleural effusion is evident. Trace abdominal free fluid is visible adjacent to the liver. IMPRESSION: 1. No hydronephrosis or acute renal findings. 2. Abnormal bilateral renal cortical echogenicity. Differential considerations include chronic medical renal disease versus HIV nephropathy. 3. Left pleural effusion is new since 07/14/2017. Electronically Signed   By: Genevie Ann M.D.   On: 07/17/2017 22:21   Medications: Infusions: . famotidine (PEPCID) IV Stopped (07/18/17 1050)  . methocarbamol (ROBAXIN)  IV Stopped (07/18/17 1355)  . piperacillin-tazobactam (ZOSYN)  IV Stopped (07/19/17 0005)  .  sodium bicarbonate  infusion 1000 mL 75 mL/hr at 07/19/17 0113    Scheduled Medications: . Chlorhexidine Gluconate Cloth  6 each Topical Daily  . enoxaparin (LOVENOX) injection   30 mg Subcutaneous Q24H  . mupirocin ointment  1 application Nasal BID    have reviewed scheduled and prn medications.  Physical Exam: General: NAD Heart: RRR Lungs: dec BS at bases Abdomen: dec BS- NGT Extremities: pitting edema      07/19/2017,8:37 AM  LOS: 6 days

## 2017-07-19 NOTE — Progress Notes (Signed)
Patient ID: Joe Morris, male   DOB: [birth year], 37 y.o.   MRN: [id number]    4 Days Post-Op  Subjective: Patient feels some breakthrough nausea despite NGT.  Thinks maybe it is the ice chips.  Mobilizing well.  Passed a small amount of flatus and had about 5   Objective: Vital signs in last 24 hours: Temp:  [97.6 F (36.4 C)-99 F (37.2 C)] 98 F (36.7 C) (05/14 0744) Pulse Rate:  [62-89] 73 (05/14 0744) Resp:  [16-24] 18 (05/14 0744) BP: (134-143)/(78-95) 143/94 (05/14 0744) SpO2:  [96 %-99 %] 99 % (05/14 0744) Last BM Date: 07/18/17  Intake/Output from previous day: 05/13 0701 - 05/14 0700 In: 1215.4 [P.O.:120; I.V.:885.4; IV Piggyback:210] Out: 860 [Urine:600; Emesis/NG output:250; Drains:10] Intake/Output this shift: No intake/output data recorded.  PE: Gen: up in chair, NAD Heart: regular Lungs: CTAB Abd: soft, few hypoactive BS, NGT with bilious output, but does appear more watered down today. 250cc documented only yesterday.  Midline wound with VAC in place.  JP drain was just drained, but appears to have serosang type drainage in the tubing.  Lab Results:  Recent Labs    07/18/17 0932 07/19/17 0244  WBC 23.9* 17.0*  HGB 10.1* 9.5*  HCT 30.2* 28.2*  PLT 440* 425*   BMET Recent Labs    07/18/17 0510 07/19/17 0244  NA 140 141  K 5.0 4.3  CL 111 107  CO2 18* 19*  GLUCOSE 122* 136*  BUN 31* 42*  CREATININE 7.68* 8.52*  CALCIUM 8.4* 8.5*   PT/INR No results for input(s): LABPROT, INR in the last 72 hours. CMP     Component Value Date/Time   NA 141 07/19/2017 0244   K 4.3 07/19/2017 0244   CL 107 07/19/2017 0244   CO2 19 (L) 07/19/2017 0244   GLUCOSE 136 (H) 07/19/2017 0244   BUN 42 (H) 07/19/2017 0244   CREATININE 8.52 (H) 07/19/2017 0244   CALCIUM 8.5 (L) 07/19/2017 0244   PROT 7.2 07/13/2017 1304   ALBUMIN 2.2 (L) 07/19/2017 0244   AST 20 07/13/2017 1304   ALT 19 07/13/2017 1304   ALKPHOS 90 07/13/2017 1304   BILITOT 1.4 (H) 07/13/2017  1304   GFRNONAA 7 (L) 07/19/2017 0244   GFRAA 8 (L) 07/19/2017 0244   Lipase     Component Value Date/Time   LIPASE 26 07/13/2017 1304       Studies/Results: US Renal  Result Date: 07/17/2017 CLINICAL DATA:  37 year old male with renal failure. Abdominal abscess. EXAM: RENAL / URINARY TRACT ULTRASOUND COMPLETE COMPARISON:  CT Abdomen and Pelvis 07/14/2017, and earlier. FINDINGS: Right Kidney: Length: 11.3 centimeters. Echogenic right renal cortex (image 2). No right hydronephrosis or renal mass. Left Kidney: Length: 12.2 centimeters. Echogenic left renal cortex. No left hydronephrosis or left renal mass. Bladder: Diminutive, a Foley catheter balloon is visible. Other findings: A left pleural effusion is evident. Trace abdominal free fluid is visible adjacent to the liver. IMPRESSION: 1. No hydronephrosis or acute renal findings. 2. Abnormal bilateral renal cortical echogenicity. Differential considerations include chronic medical renal disease versus HIV nephropathy. 3. Left pleural effusion is new since 07/14/2017. Electronically Signed   By: Genevie Ann M.D.   On: 07/17/2017 22:21    Anti-infectives: Anti-infectives (From admission, onward)   Start     Dose/Rate Route Frequency Ordered Stop   07/16/17 1600  piperacillin-tazobactam (ZOSYN) IVPB 2.25 g     2.25 g 100 mL/hr over 30 Minutes Intravenous Every 8 hours 07/16/17 1213  07/15/17 1445  vancomycin (VANCOCIN) IVPB 750 mg/150 ml premix  Status:  Discontinued     750 mg 150 mL/hr over 60 Minutes Intravenous To Surgery 07/15/17 1437 07/15/17 2041   07/14/17 2300  piperacillin-tazobactam (ZOSYN) IVPB 3.375 g  Status:  Discontinued     3.375 g 12.5 mL/hr over 240 Minutes Intravenous Every 8 hours 07/13/17 1930 07/14/17 0858   07/14/17 0900  piperacillin-tazobactam (ZOSYN) IVPB 3.375 g  Status:  Discontinued     3.375 g 12.5 mL/hr over 240 Minutes Intravenous Every 8 hours 07/14/17 0858 07/16/17 1213   07/14/17 0445  vancomycin  (VANCOCIN) IVPB 750 mg/150 ml premix  Status:  Discontinued     750 mg 150 mL/hr over 60 Minutes Intravenous Every 8 hours 07/14/17 0431 07/16/17 0856   07/13/17 1900  piperacillin-tazobactam (ZOSYN) IVPB 3.375 g  Status:  Discontinued     3.375 g 12.5 mL/hr over 240 Minutes Intravenous Every 8 hours 07/13/17 1847 07/13/17 1930   07/13/17 1530  piperacillin-tazobactam (ZOSYN) IVPB 3.375 g     3.375 g 100 mL/hr over 30 Minutes Intravenous  Once 07/13/17 1515 07/13/17 1636       Assessment/Plan Prior h/o GI bleed with no known cause, workup unrevealing  Complexintra-abdominal abscess POD 4, s/p SBR and drainage of intra-abdominal abscess -cont NGT despite BMs and a little bit of flatus.  Still with breakthrough nausea over NGT.  He is going to cut back on his ice chips today to see if this helps.  I have added compazine to see if that works better than the zofran. -ambulate and pulm toilet -VAC change MWF   -cont zosyn at renal adjusted dose of 2.25mg  -WBC down to 17 today from 23 yesterday.  Will cont zosyn and hold on additional coverage such as eraxis. -tx to 6N  ARF -appreciate nephrology's assistance -cr up to 8.5 today. On bi-carb.   IVFs at 75cc/hr.  -renal panel daily -UOP did decrease some to 600cc from 1600cc.  ID -zosyn 5/8>> VTE -SCDs/Lovenox (30mg ) FEN -IVF, NPO/NGT Foley -in place for strict I&Os  Follow up -Dr. Redmond Pulling  Plan -follow cr.  Cont NGT and consider DC tomorrow.   LOS: 6 days    Henreitta Cea , [facility] 07/19/2017, 8:42 AM Pager: [phone]

## 2017-07-19 NOTE — Progress Notes (Signed)
Pt reported feeling nausea and asked for PRN zofran 4 mg. During administration of medication patient had a small emesis of green bile. Patient then given Fentanyl 100 mcg for pain in his abdomen. Pt reports vomitting increased his pain.  Discussed with patient other prn phenergan was available to help alleviate his nausea and vomiting. Pt decided it was best to hold off administration until a later time. Will continue to monitor and assess patient.

## 2017-07-19 NOTE — Progress Notes (Signed)
4 Days Post-Op   Subjective/Chief Complaint: Nausea, has passed flatus and some stool. oob to chair   Objective: Vital signs in last 24 hours: Temp:  [97.6 F (36.4 C)-99 F (37.2 C)] 98 F (36.7 C) (05/14 0744) Pulse Rate:  [62-89] 73 (05/14 0744) Resp:  [16-24] 18 (05/14 0744) BP: (134-143)/(78-95) 143/94 (05/14 0744) SpO2:  [96 %-99 %] 99 % (05/14 0744) Last BM Date: 07/18/17  Intake/Output from previous day: 05/13 0701 - 05/14 0700 In: 1215.4 [P.O.:120; I.V.:885.4; IV Piggyback:210] Out: 860 [Urine:600; Emesis/NG output:250; Drains:10] Intake/Output this shift: No intake/output data recorded.  Resp: clear to auscultation bilaterally Cardio: regular rate and rhythm GI: some bs vac in place drain serous  Lab Results:  Recent Labs    07/18/17 0932 07/19/17 0244  WBC 23.9* 17.0*  HGB 10.1* 9.5*  HCT 30.2* 28.2*  PLT 440* 425*   BMET Recent Labs    07/18/17 0510 07/19/17 0244  NA 140 141  K 5.0 4.3  CL 111 107  CO2 18* 19*  GLUCOSE 122* 136*  BUN 31* 42*  CREATININE 7.68* 8.52*  CALCIUM 8.4* 8.5*   PT/INR No results for input(s): LABPROT, INR in the last 72 hours. ABG No results for input(s): PHART, HCO3 in the last 72 hours.  Invalid input(s): PCO2, PO2  Studies/Results: US Renal  Result Date: 07/17/2017 CLINICAL DATA:  37 year old male with renal failure. Abdominal abscess. EXAM: RENAL / URINARY TRACT ULTRASOUND COMPLETE COMPARISON:  CT Abdomen and Pelvis 07/14/2017, and earlier. FINDINGS: Right Kidney: Length: 11.3 centimeters. Echogenic right renal cortex (image 2). No right hydronephrosis or renal mass. Left Kidney: Length: 12.2 centimeters. Echogenic left renal cortex. No left hydronephrosis or left renal mass. Bladder: Diminutive, a Foley catheter balloon is visible. Other findings: A left pleural effusion is evident. Trace abdominal free fluid is visible adjacent to the liver. IMPRESSION: 1. No hydronephrosis or acute renal findings. 2. Abnormal  bilateral renal cortical echogenicity. Differential considerations include chronic medical renal disease versus HIV nephropathy. 3. Left pleural effusion is new since 07/14/2017. Electronically Signed   By: Genevie Ann M.D.   On: 07/17/2017 22:21    Anti-infectives: Anti-infectives (From admission, onward)   Start     Dose/Rate Route Frequency Ordered Stop   07/16/17 1600  piperacillin-tazobactam (ZOSYN) IVPB 2.25 g     2.25 g 100 mL/hr over 30 Minutes Intravenous Every 8 hours 07/16/17 1213     07/15/17 1445  vancomycin (VANCOCIN) IVPB 750 mg/150 ml premix  Status:  Discontinued     750 mg 150 mL/hr over 60 Minutes Intravenous To Surgery 07/15/17 1437 07/15/17 2041   07/14/17 2300  piperacillin-tazobactam (ZOSYN) IVPB 3.375 g  Status:  Discontinued     3.375 g 12.5 mL/hr over 240 Minutes Intravenous Every 8 hours 07/13/17 1930 07/14/17 0858   07/14/17 0900  piperacillin-tazobactam (ZOSYN) IVPB 3.375 g  Status:  Discontinued     3.375 g 12.5 mL/hr over 240 Minutes Intravenous Every 8 hours 07/14/17 0858 07/16/17 1213   07/14/17 0445  vancomycin (VANCOCIN) IVPB 750 mg/150 ml premix  Status:  Discontinued     750 mg 150 mL/hr over 60 Minutes Intravenous Every 8 hours 07/14/17 0431 07/16/17 0856   07/13/17 1900  piperacillin-tazobactam (ZOSYN) IVPB 3.375 g  Status:  Discontinued     3.375 g 12.5 mL/hr over 240 Minutes Intravenous Every 8 hours 07/13/17 1847 07/13/17 1930   07/13/17 1530  piperacillin-tazobactam (ZOSYN) IVPB 3.375 g     3.375 g 100 mL/hr  over 30 Minutes Intravenous  Once 07/13/17 1515 07/13/17 1636      Assessment/Plan: Complexintra-abdominal abscess POD 4, s/p SBR and drainage of intra-abdominal abscess -cont NGT despite BM and flatus today.  Has minimal BS.  Still with some NGT output.  Given this is a very proximal SBR, we will go quite slowly with his diet advancement. -ambulate and pulm toilet -cont vac changes   -cont zosyn at renal adjusted dose of 2.25mg  -WBC  down today  ARF -appreciate nephrology's assistance -cr upagain today, renal following.   ID -zosyn 5/8>> VTE -SCDs/Lovenox (30mg ) FEN -IVF, NPO/NGT Foley -in place for strict I&Os  Follow up -Dr. Redmond Pulling path pending    Rolm Bookbinder 07/19/2017

## 2017-07-20 LAB — RENAL FUNCTION PANEL
Albumin: 2.3 g/dL — ABNORMAL LOW (ref 3.5–5.0)
Anion gap: 17 — ABNORMAL HIGH (ref 5–15)
BUN: 51 mg/dL — ABNORMAL HIGH (ref 6–20)
CO2: 19 mmol/L — ABNORMAL LOW (ref 22–32)
Calcium: 8.5 mg/dL — ABNORMAL LOW (ref 8.9–10.3)
Chloride: 100 mmol/L — ABNORMAL LOW (ref 101–111)
Creatinine, Ser: 10.38 mg/dL — ABNORMAL HIGH (ref 0.61–1.24)
GFR calc Af Amer: 7 mL/min — ABNORMAL LOW (ref >60)
GFR calc non Af Amer: 6 mL/min — ABNORMAL LOW (ref >60)
Glucose, Bld: 116 mg/dL — ABNORMAL HIGH (ref 65–99)
Phosphorus: 7 mg/dL — ABNORMAL HIGH (ref 2.5–4.6)
Potassium: 3.8 mmol/L (ref 3.5–5.1)
Sodium: 136 mmol/L (ref 135–145)

## 2017-07-20 LAB — CBC
HCT: 30.6 % — ABNORMAL LOW (ref 39.0–52.0)
Hemoglobin: 10.2 g/dL — ABNORMAL LOW (ref 13.0–17.0)
MCH: 30.9 pg (ref 26.0–34.0)
MCHC: 33.3 g/dL (ref 30.0–36.0)
MCV: 92.7 fL (ref 78.0–100.0)
Platelets: 508 10*3/uL — ABNORMAL HIGH (ref 150–400)
RBC: 3.3 MIL/uL — ABNORMAL LOW (ref 4.22–5.81)
RDW: 13.4 % (ref 11.5–15.5)
WBC: 17.3 10*3/uL — ABNORMAL HIGH (ref 4.0–10.5)

## 2017-07-20 MED ORDER — LORAZEPAM 2 MG/ML IJ SOLN
1.0000 mg | Freq: Three times a day (TID) | INTRAMUSCULAR | Status: DC | PRN
  Administered 2017-07-20 – 2017-08-07 (×18): 1 mg via INTRAVENOUS
  Filled 2017-07-20 (×18): qty 1

## 2017-07-20 NOTE — Care Management Note (Signed)
Case Management Note  Patient Details  Name: Joe Morris MRN: [id number] Date of Birth: 10/20/1980  Subjective/Objective:                    Action/Plan:  Continuing to follow for discharge needs.  If needed home VAC application on shadow chart for PA/MD signature. Expected Discharge Date:                  Expected Discharge Plan:  [place]  In-House Referral:     Discharge planning Services  CM Consult  Post Acute Care Choice:    Choice offered to:     DME Arranged:    DME Agency:     HH Arranged:    [place] Agency:     Status of Service:  In process, will continue to follow  If discussed at Long Length of Stay Meetings, dates discussed:    Additional Comments:  Marilu Favre, RN 07/20/2017, 11:15 AM

## 2017-07-20 NOTE — Progress Notes (Signed)
Foley catheter discontinued as ordered. Pt is due to void

## 2017-07-20 NOTE — Progress Notes (Signed)
Subjective:  725of UOP recorded, crt up again - GI issues seem to be improving- he is more up and around, feels better Objective Vital signs in last 24 hours: Vitals:   07/19/17 1233 07/19/17 1551 07/19/17 2229 07/20/17 0444  BP: (!) 137/92 (!) 146/91 (!) 149/97 (!) 151/92  Pulse: 80 74 74 85  Resp: 19  18 17   Temp: 97.9 F (36.6 C) 98.2 F (36.8 C) (!) 97.5 F (36.4 C) 98.6 F (37 C)  TempSrc: Oral Oral Oral Oral  SpO2: 96% 97% 97% 94%  Weight:      Height:       Weight change:   Intake/Output Summary (Last 24 hours) at 07/20/2017 1025 Last data filed at 07/20/2017 4259 Gross per 24 hour  Intake 1905 ml  Output 1735 ml  Net 170 ml    Assessment/ Plan: Pt is a 37 y.o. yo male who was admitted with perf viscous, s/p surgical repair  on 07/13/2017 with  Now with AKI  Assessment/Plan: 1. AKI-  in the setting of abdominal surgery and contrast as well as vanc- urine pretty bland- U/S good sized kidneys- echogenic - crt pre insult was 1.24.  Nonoliguric- there have been no absolute indications for HD - still just watching and waiting-  still hopeful for eventual recovery - since he is clinically feeling better I am still Ok with obs 2. HTN/vol- tank is full- nonoliguric- did respond to lasix but do not feel inclined to redose at this time- will stop IV bicarb  3. Anemia- situational- follow  4. Metabolic acidosis- stable on bicarb drip - actually given volume issue I will stop  5. Phos up but stable - no action yet    Alda Gaultney A    Labs: Basic Metabolic Panel: Recent Labs  Lab 07/18/17 0510 07/19/17 0244 07/20/17 0411  NA 140 141 136  K 5.0 4.3 3.8  CL 111 107 100*  CO2 18* 19* 19*  GLUCOSE 122* 136* 116*  BUN 31* 42* 51*  CREATININE 7.68* 8.52* 10.38*  CALCIUM 8.4* 8.5* 8.5*  PHOS 6.2* 6.3* 7.0*   Liver Function Tests: Recent Labs  Lab 07/13/17 1304  07/18/17 0510 07/19/17 0244 07/20/17 0411  AST 20  --   --   --   --   ALT 19  --   --   --   --    ALKPHOS 90  --   --   --   --   BILITOT 1.4*  --   --   --   --   PROT 7.2  --   --   --   --   ALBUMIN 3.8   < > 2.2* 2.2* 2.3*   < > = values in this interval not displayed.   Recent Labs  Lab 07/13/17 1304  LIPASE 26   No results for input(s): AMMONIA in the last 168 hours. CBC: Recent Labs  Lab 07/16/17 0448 07/17/17 0303 07/18/17 0932 07/19/17 0244 07/20/17 0411  WBC 11.8* 16.0* 23.9* 17.0* 17.3*  HGB 13.2 10.6* 10.1* 9.5* 10.2*  HCT 39.6 31.8* 30.2* 28.2* 30.6*  MCV 94.1 94.1 93.8 93.1 92.7  PLT 351 332 440* 425* 508*   Cardiac Enzymes: No results for input(s): CKTOTAL, CKMB, CKMBINDEX, TROPONINI in the last 168 hours. CBG: No results for input(s): GLUCAP in the last 168 hours.  Iron Studies: No results for input(s): IRON, TIBC, TRANSFERRIN, FERRITIN in the last 72 hours. Studies/Results: No results found. Medications: Infusions: . famotidine (PEPCID)  IV 20 mg (07/20/17 1002)  . methocarbamol (ROBAXIN)  IV Stopped (07/20/17 0532)  . piperacillin-tazobactam (ZOSYN)  IV Stopped (07/20/17 0847)  .  sodium bicarbonate  infusion 1000 mL 75 mL/hr at 07/19/17 0113    Scheduled Medications: . Chlorhexidine Gluconate Cloth  6 each Topical Daily  . enoxaparin (LOVENOX) injection  30 mg Subcutaneous Q24H  . mupirocin ointment  1 application Nasal BID    have reviewed scheduled and prn medications.  Physical Exam: General: NAD Heart: RRR Lungs: dec BS at bases Abdomen: dec BS- NGT- being clamped  Extremities: pitting edema      07/20/2017,10:25 AM  LOS: 7 days

## 2017-07-20 NOTE — Progress Notes (Signed)
Patient ID: Joe Morris, male   DOB: 03-Mar-1981, 37 y.o.   MRN: [id number]   Acute Care Surgery Service Progress Note:    Chief Complaint/Subjective: Several loose BMs.  Tolerated clamping trial, less nausea. Didn't really take in a lot of ice/water for the 1000cc out of NG Having some abd muscle cramps  Objective: Vital signs in last 24 hours: Temp:  [97.5 F (36.4 C)-98.6 F (37 C)] 98.6 F (37 C) (05/15 0444) Pulse Rate:  [73-85] 85 (05/15 0444) Resp:  [17-19] 17 (05/15 0444) BP: (137-151)/(91-97) 151/92 (05/15 0444) SpO2:  [94 %-99 %] 94 % (05/15 0444) Last BM Date: 07/19/17  Intake/Output from previous day: 05/14 0701 - 05/15 0700 In: 3407.5 [I.V.:2942.5; IV Piggyback:465] Out: 1735 [Urine:725; Emesis/NG output:1000; Drains:10] Intake/Output this shift: No intake/output data recorded.  Lungs: cta, nonlabored  Cardiovascular: reg  Abd: soft, not really distended approp TTP, drain - serosang  Extremities: no edema, +SCDs  Neuro: alert, nonfocal  Lab Results: CBC  Recent Labs    07/19/17 0244 07/20/17 0411  WBC 17.0* 17.3*  HGB 9.5* 10.2*  HCT 28.2* 30.6*  PLT 425* 508*   BMET Recent Labs    07/19/17 0244 07/20/17 0411  NA 141 136  K 4.3 3.8  CL 107 100*  CO2 19* 19*  GLUCOSE 136* 116*  BUN 42* 51*  CREATININE 8.52* 10.38*  CALCIUM 8.5* 8.5*   LFT Hepatic Function Latest Ref Rng & Units 07/20/2017 07/19/2017 07/18/2017  Total Protein 6.5 - 8.1 g/dL - - -  Albumin 3.5 - 5.0 g/dL 2.3(L) 2.2(L) 2.2(L)  AST 15 - 41 U/L - - -  ALT 17 - 63 U/L - - -  Alk Phosphatase 38 - 126 U/L - - -  Total Bilirubin 0.3 - 1.2 mg/dL - - -   PT/INR No results for input(s): LABPROT, INR in the last 72 hours. ABG No results for input(s): PHART, HCO3 in the last 72 hours.  Invalid input(s): PCO2, PO2  Studies/Results:  Anti-infectives: Anti-infectives (From admission, onward)   Start     Dose/Rate Route Frequency Ordered Stop   07/16/17 1600   piperacillin-tazobactam (ZOSYN) IVPB 2.25 g     2.25 g 100 mL/hr over 30 Minutes Intravenous Every 8 hours 07/16/17 1213     07/15/17 1445  vancomycin (VANCOCIN) IVPB 750 mg/150 ml premix  Status:  Discontinued     750 mg 150 mL/hr over 60 Minutes Intravenous To Surgery 07/15/17 1437 07/15/17 2041   07/14/17 2300  piperacillin-tazobactam (ZOSYN) IVPB 3.375 g  Status:  Discontinued     3.375 g 12.5 mL/hr over 240 Minutes Intravenous Every 8 hours 07/13/17 1930 07/14/17 0858   07/14/17 0900  piperacillin-tazobactam (ZOSYN) IVPB 3.375 g  Status:  Discontinued     3.375 g 12.5 mL/hr over 240 Minutes Intravenous Every 8 hours 07/14/17 0858 07/16/17 1213   07/14/17 0445  vancomycin (VANCOCIN) IVPB 750 mg/150 ml premix  Status:  Discontinued     750 mg 150 mL/hr over 60 Minutes Intravenous Every 8 hours 07/14/17 0431 07/16/17 0856   07/13/17 1900  piperacillin-tazobactam (ZOSYN) IVPB 3.375 g  Status:  Discontinued     3.375 g 12.5 mL/hr over 240 Minutes Intravenous Every 8 hours 07/13/17 1847 07/13/17 1930   07/13/17 1530  piperacillin-tazobactam (ZOSYN) IVPB 3.375 g     3.375 g 100 mL/hr over 30 Minutes Intravenous  Once 07/13/17 1515 07/13/17 1636      Medications: Scheduled Meds: . Chlorhexidine Gluconate Cloth  6 each Topical Daily  . enoxaparin (LOVENOX) injection  30 mg Subcutaneous Q24H  . mupirocin ointment  1 application Nasal BID   Continuous Infusions: . famotidine (PEPCID) IV Stopped (07/19/17 1119)  . methocarbamol (ROBAXIN)  IV Stopped (07/20/17 0532)  . piperacillin-tazobactam (ZOSYN)  IV Stopped (07/19/17 2345)  .  sodium bicarbonate  infusion 1000 mL 75 mL/hr at 07/19/17 0113   PRN Meds:.fentaNYL (SUBLIMAZE) injection, ondansetron (ZOFRAN) IV, ondansetron **OR** [DISCONTINUED] ondansetron (ZOFRAN) IV, prochlorperazine, promethazine, zolpidem  Assessment/Plan: Patient Active Problem List   Diagnosis Date Noted  . Acute kidney injury ([place]) 07/17/2017  . Perforation  of small intestine ([place]) 07/17/2017  . Intra-abdominal abscess ([place]) 07/13/2017   Prior h/o GI bleed with no known cause, workup unrevealing  Complexintra-abdominal abscess POD 5, s/p SBR and drainage of intra-abdominal abscess -cont NGT despite BMs and a little bit of flatus given output but will cont clamp and allow sips.   -ambulate and pulm toilet -VAC change MWF  -cont zosyn at renal adjusted dose of 2.41m -WBC stable at 17 today from 23 a few days ago.  Will cont zosyn and hold on additional coverage such as eraxis.   ARF -appreciate nephrology's assistance -cr up to >10 today. On bi-carb.  IVFs at 75cc/hr.  -renal panel daily -UOP did increase from 600cc to 1000cc. -will ask renal if we can dc foley - pt state he will save urine  ID -zosyn 5/8>> VTE -SCDs/Lovenox (331m FEN -IVF, sips/NGT Foley -in place for strict I&Os; will ask renal if we can dc foley - pt state he will save urine Follow up -Dr. WiRedmond PullingPlan -follow cr - ask renal about foley, lovenox. Cont NGT clamp but allow sips and consider DC tomorrow. Add prn low dose ativan for muscle cramps - discussed with pharm; per pharm zosyn and lovenox are still ok even with pt's current CrCl. Showed intraop photos to pt.    Disposition:  LOS: 7 days    ErLeighton RuffWiRedmond PullingMD, FACS General, Bariatric, & Minimally Invasive Surgery ([phone] County Community Hospitalur[facility], [facility].

## 2017-07-20 NOTE — Progress Notes (Signed)
Wound vac dressing changed this pm.

## 2017-07-21 LAB — CREATININE, FLUID (PLEURAL, PERITONEAL, JP DRAINAGE): Creat, Fluid: 12 mg/dL

## 2017-07-21 LAB — RENAL FUNCTION PANEL
Albumin: 2.1 g/dL — ABNORMAL LOW (ref 3.5–5.0)
Anion gap: 16 — ABNORMAL HIGH (ref 5–15)
BUN: 59 mg/dL — ABNORMAL HIGH (ref 6–20)
CO2: 20 mmol/L — ABNORMAL LOW (ref 22–32)
Calcium: 8.7 mg/dL — ABNORMAL LOW (ref 8.9–10.3)
Chloride: 101 mmol/L (ref 101–111)
Creatinine, Ser: 11.19 mg/dL — ABNORMAL HIGH (ref 0.61–1.24)
GFR calc Af Amer: 6 mL/min — ABNORMAL LOW (ref >60)
GFR calc non Af Amer: 5 mL/min — ABNORMAL LOW (ref >60)
Glucose, Bld: 95 mg/dL (ref 65–99)
Phosphorus: 7.7 mg/dL — ABNORMAL HIGH (ref 2.5–4.6)
Potassium: 3.9 mmol/L (ref 3.5–5.1)
Sodium: 137 mmol/L (ref 135–145)

## 2017-07-21 LAB — CBC
HCT: 28.7 % — ABNORMAL LOW (ref 39.0–52.0)
Hemoglobin: 9.9 g/dL — ABNORMAL LOW (ref 13.0–17.0)
MCH: 31.2 pg (ref 26.0–34.0)
MCHC: 34.5 g/dL (ref 30.0–36.0)
MCV: 90.5 fL (ref 78.0–100.0)
Platelets: 549 10*3/uL — ABNORMAL HIGH (ref 150–400)
RBC: 3.17 MIL/uL — ABNORMAL LOW (ref 4.22–5.81)
RDW: 12.9 % (ref 11.5–15.5)
WBC: 17.6 10*3/uL — ABNORMAL HIGH (ref 4.0–10.5)

## 2017-07-21 LAB — AMYLASE, PLEURAL OR PERITONEAL FLUID: Amylase, Fluid: 276 U/L

## 2017-07-21 MED ORDER — BOOST / RESOURCE BREEZE PO LIQD CUSTOM: 1.0000 | Freq: Three times a day (TID) | ORAL | Status: DC

## 2017-07-21 MED ORDER — PROMETHAZINE HCL 25 MG PO TABS
12.5000 mg | ORAL_TABLET | Freq: Four times a day (QID) | ORAL | Status: DC | PRN
  Administered 2017-07-24: 12.5 mg via ORAL
  Filled 2017-07-21: qty 1

## 2017-07-21 NOTE — Progress Notes (Signed)
Right forearm swollen, a little red and hard.  IV infiltrated early this morning. Warm compress applied, will cont to monitor.

## 2017-07-21 NOTE — Progress Notes (Addendum)
[facility] Progress Note  6 Days Post-Op  Subjective: CC-  NG tube clamped yesterday AM, patient states that he returned it to LIWS last night due to nausea and he had 250cc output. Reclamped it and has been clamped over night without n/v. Tolerating sips of clears this morning. Passing flatus and had another loose BM this morning. Abdominal pain well controlled. Foley removed yesterday morning. Patient states that he had had 300cc UOP since removal.  Objective: Vital signs in last 24 hours: Temp:  [98.2 F (36.8 C)-99 F (37.2 C)] 99 F (37.2 C) (05/16 0544) Pulse Rate:  [70-84] 84 (05/16 0544) Resp:  [18] 18 (05/16 0544) BP: (140-158)/(89-98) 145/90 (05/16 0544) SpO2:  [95 %-100 %] 95 % (05/16 0544) Last BM Date: 07/20/17  Intake/Output from previous day: 05/15 0701 - 05/16 0700 In: 790 [P.O.:420; IV Piggyback:370] Out: 1130 [Urine:200; Emesis/NG output:800; Drains:130] Intake/Output this shift: No intake/output data recorded.  PE: Gen:  Alert, NAD, pleasant HEENT: EOM's intact, pupils equal and round Card:  RRR, no M/G/R heard Pulm:  CTAB, no W/R/R, effort normal Abd: Soft, mild distension, +BS, vac to midline, drain with serosanguinous drainage Ext:  Calves soft and nontender, no edema Psych: A&Ox3  Skin: no rashes noted, warm and dry  Lab Results:  Recent Labs    07/20/17 0411 07/21/17 0446  WBC 17.3* 17.6*  HGB 10.2* 9.9*  HCT 30.6* 28.7*  PLT 508* 549*   BMET Recent Labs    07/20/17 0411 07/21/17 0446  NA 136 137  K 3.8 3.9  CL 100* 101  CO2 19* 20*  GLUCOSE 116* 95  BUN 51* 59*  CREATININE 10.38* 11.19*  CALCIUM 8.5* 8.7*   PT/INR No results for input(s): LABPROT, INR in the last 72 hours. CMP     Component Value Date/Time   NA 137 07/21/2017 0446   K 3.9 07/21/2017 0446   CL 101 07/21/2017 0446   CO2 20 (L) 07/21/2017 0446   GLUCOSE 95 07/21/2017 0446   BUN 59 (H) 07/21/2017 0446   CREATININE 11.19 (H) 07/21/2017 0446    CALCIUM 8.7 (L) 07/21/2017 0446   PROT 7.2 07/13/2017 1304   ALBUMIN 2.1 (L) 07/21/2017 0446   AST 20 07/13/2017 1304   ALT 19 07/13/2017 1304   ALKPHOS 90 07/13/2017 1304   BILITOT 1.4 (H) 07/13/2017 1304   GFRNONAA 5 (L) 07/21/2017 0446   GFRAA 6 (L) 07/21/2017 0446   Lipase     Component Value Date/Time   LIPASE 26 07/13/2017 1304       Studies/Results: No results found.  Anti-infectives: Anti-infectives (From admission, onward)   Start     Dose/Rate Route Frequency Ordered Stop   07/16/17 1600  piperacillin-tazobactam (ZOSYN) IVPB 2.25 g     2.25 g 100 mL/hr over 30 Minutes Intravenous Every 8 hours 07/16/17 1213     07/15/17 1445  vancomycin (VANCOCIN) IVPB 750 mg/150 ml premix  Status:  Discontinued     750 mg 150 mL/hr over 60 Minutes Intravenous To Surgery 07/15/17 1437 07/15/17 2041   07/14/17 2300  piperacillin-tazobactam (ZOSYN) IVPB 3.375 g  Status:  Discontinued     3.375 g 12.5 mL/hr over 240 Minutes Intravenous Every 8 hours 07/13/17 1930 07/14/17 0858   07/14/17 0900  piperacillin-tazobactam (ZOSYN) IVPB 3.375 g  Status:  Discontinued     3.375 g 12.5 mL/hr over 240 Minutes Intravenous Every 8 hours 07/14/17 0858 07/16/17 1213   07/14/17 0445  vancomycin (VANCOCIN) IVPB 750  mg/150 ml premix  Status:  Discontinued     750 mg 150 mL/hr over 60 Minutes Intravenous Every 8 hours 07/14/17 0431 07/16/17 0856   07/13/17 1900  piperacillin-tazobactam (ZOSYN) IVPB 3.375 g  Status:  Discontinued     3.375 g 12.5 mL/hr over 240 Minutes Intravenous Every 8 hours 07/13/17 1847 07/13/17 1930   07/13/17 1530  piperacillin-tazobactam (ZOSYN) IVPB 3.375 g     3.375 g 100 mL/hr over 30 Minutes Intravenous  Once 07/13/17 1515 07/13/17 1636       Assessment/Plan Prior h/o GI bleed with no known cause, workup unrevealing  Complexintra-abdominal abscess S/p ex lap SBR and drainage of intra-abdominal abscess 5/10 Dr. Redmond Pulling - POD 6 - path: BENIGN SMALL BOWEL TYPE  MUCOSA WITH SEROSITIS. INFLAMED OMENTUM. THE SURGICAL RESECTION MARGINS ARE HISTOLOGICALLY VIABLE. THERE IS NO EVIDENCE OF MALIGNANCY -VAC change MWF - NG tube back to LIWS last night with 250cc output, clamped again over night and patient tolerating sips of clears - drain with increased output 80+cc serosanguinous drainage  ARF -appreciate nephrology's assistance -cr up to>11today. -bicarb stopped yesterday. IVFsat75cc/hr.  -UOP 300cc since foley removed yesterday morning  ID -zosyn at renal adjusted dose 5/8>> VTE -SCDs/Lovenox (30mg ) FEN -IVF, sips/NGT clamped Foley -d/c 5/15, strict I&Os Follow up -Dr. Redmond Pulling  Plan -Keep NG tube clamped today and allow sips of clears, if he tolerates this today will possibly d/c NG tube this afternoon. BUN/Cr continue to slowly rise and UOP less, will await nephrology recommendations. Will send JP drain output for amylase/creatinine.   LOS: 8 days    Wellington Hampshire , [facility] 07/21/2017, 8:22 AM Pager: [phone] Consults: [phone] Mon-Fri 7:00 am-4:30 pm Sat-Sun 7:00 am-11:30 am

## 2017-07-21 NOTE — Progress Notes (Signed)
Mainly social visit to see pt Tolerated clamp since yesterday. A little nausea after liquids at lunch but has had several BMs.  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 Will need outpt oncology evaluation since technically there was bowel perf  IV team nurse states he is difficult stick and it took u/s to find a pIV earlier day Appears this iv is infiltrated They are asking for picc  Asked PA to touch base with Renal attending about picc since pt is in ARF and Cr cont to increase. I believe pt needs some form of IV access for iv abx and other meds. eleveate forearm.   Removed NG. Small amount of emesis and dry heaves with removal. Getting sublingal zofran ordered. Reminded pt to take liquids slowly.   Still hopeful for kidney recovery  Explained to pt that I am not available next several days but [facility] would cont to see him  Leighton Ruff. Redmond Pulling, MD, FACS General, Bariatric, & Minimally Invasive Surgery [facility], [place]

## 2017-07-21 NOTE — Progress Notes (Addendum)
Nutrition Follow-up  DOCUMENTATION CODES:   Not applicable  INTERVENTION:   -Once diet is advanced, add Boost Breeze po TID, each supplement provides 250 kcal and 9 grams of protein -If prolonged NPO/clear liquid diet status is anticipated, consider initiation of nutrition support  NUTRITION DIAGNOSIS:   Inadequate oral intake related to altered GI function as evidenced by NPO status.  Ongoing  GOAL:   Patient will meet greater than or equal to 90% of their needs  Unmet  MONITOR:   Diet advancement, Labs, Weight trends, Skin, I & O's  REASON FOR ASSESSMENT:   Malnutrition Screening Tool    ASSESSMENT:   Joe Morris is a 37yo male who presented to [facility] earlier today with 4 days of persistent abdominal pain, fevers, and chills.  5/10-s/p procedures: EXPLORATORY LAPAROTOMY  PROXIMAL SMALL BOWEL RESECTION DRAINAGE OF ABDOMINAL ABSCESS  APPLICATION OF WOUND VAC  Reviewed I/O's: 800 ml NGT output x 24 hours. -310 ml x 24 hours and +13.1 L since admission  Pt has been NPO x 8 days.   Case discussed with RN, who reports NGT is currently clamped and tolerating clamping trials well. Per general surgery notes, potential to d/c NGT. Pt currently allowed sips and chips on the floor.   Labs reviewed: Phos: 7.7.   Diet Order:   Diet Order           Diet NPO time specified Except for: Ice Chips, Other (See Comments)  Diet effective now          EDUCATION NEEDS:   No education needs have been identified at this time  Skin:  Skin Assessment: Skin Integrity Issues: Skin Integrity Issues:: Wound VAC Wound Vac: abdomen  Last BM:  07/20/17  Height:   Ht Readings from Last 1 Encounters:  07/16/17 5\' 7"  (1.702 m)    Weight:   Wt Readings from Last 1 Encounters:  07/16/17 176 lb 5.9 oz (80 kg)    Ideal Body Weight:  67.3 kg  BMI:  Body mass index is 27.62 kg/m.  Estimated Nutritional Needs:   Kcal:  2000-2200  Protein:  95-110 grams  Fluid:  2.0-2.2  L    Delawrence Fridman A. Jimmye Norman, RD, LDN, CDE Pager: [phone] After hours Pager: [phone]

## 2017-07-21 NOTE — Progress Notes (Signed)
Subjective:  Only 200 of UOP recorded, crt up again - GI issues seemed to be improving- he is more up and around, feels better Objective Vital signs in last 24 hours: Vitals:   07/20/17 0444 07/20/17 1344 07/20/17 2030 07/21/17 0544  BP: (!) 151/92 140/89 (!) 158/98 (!) 145/90  Pulse: 85 76 70 84  Resp: 17   18  Temp: 98.6 F (37 C) 98.7 F (37.1 C) 98.2 F (36.8 C) 99 F (37.2 C)  TempSrc: Oral Oral Oral Oral  SpO2: 94% 100% 99% 95%  Weight:      Height:       Weight change:   Intake/Output Summary (Last 24 hours) at 07/21/2017 0957 Last data filed at 07/21/2017 0900 Gross per 24 hour  Intake 790 ml  Output 1440 ml  Net -650 ml    Assessment/ Plan: Pt is a 37 y.o. yo male who was admitted with perf viscous, s/p surgical repair  on 07/13/2017 with  Now with AKI  Assessment/Plan: 1. AKI-  in the setting of abdominal surgery and contrast as well as vanc- urine pretty bland- U/S good sized kidneys- echogenic - crt pre insult was 1.24.  Nonoliguric- there have been no absolute indications for HD - still just watching and waiting-  still hopeful for eventual recovery - since he is clinically feeling better I am still Ok with obs 2. HTN/vol- tank is full- nonoliguric- did respond to lasix but do not feel inclined to redose at this time-have stopped  IV bicarb  3. Anemia- situational- follow  4. Metabolic acidosis- stable   5. Phos up but stable - no action yet    Sebastian Lurz A    Labs: Basic Metabolic Panel: Recent Labs  Lab 07/19/17 0244 07/20/17 0411 07/21/17 0446  NA 141 136 137  K 4.3 3.8 3.9  CL 107 100* 101  CO2 19* 19* 20*  GLUCOSE 136* 116* 95  BUN 42* 51* 59*  CREATININE 8.52* 10.38* 11.19*  CALCIUM 8.5* 8.5* 8.7*  PHOS 6.3* 7.0* 7.7*   Liver Function Tests: Recent Labs  Lab 07/19/17 0244 07/20/17 0411 07/21/17 0446  ALBUMIN 2.2* 2.3* 2.1*   No results for input(s): LIPASE, AMYLASE in the last 168 hours. No results for input(s): AMMONIA in the  last 168 hours. CBC: Recent Labs  Lab 07/17/17 0303 07/18/17 0932 07/19/17 0244 07/20/17 0411 07/21/17 0446  WBC 16.0* 23.9* 17.0* 17.3* 17.6*  HGB 10.6* 10.1* 9.5* 10.2* 9.9*  HCT 31.8* 30.2* 28.2* 30.6* 28.7*  MCV 94.1 93.8 93.1 92.7 90.5  PLT 332 440* 425* 508* 549*   Cardiac Enzymes: No results for input(s): CKTOTAL, CKMB, CKMBINDEX, TROPONINI in the last 168 hours. CBG: No results for input(s): GLUCAP in the last 168 hours.  Iron Studies: No results for input(s): IRON, TIBC, TRANSFERRIN, FERRITIN in the last 72 hours. Studies/Results: No results found. Medications: Infusions: . famotidine (PEPCID) IV Stopped (07/20/17 1032)  . methocarbamol (ROBAXIN)  IV Stopped (07/21/17 0602)  . piperacillin-tazobactam (ZOSYN)  IV 2.25 g (07/21/17 3220)    Scheduled Medications: . Chlorhexidine Gluconate Cloth  6 each Topical Daily  . enoxaparin (LOVENOX) injection  30 mg Subcutaneous Q24H    have reviewed scheduled and prn medications.  Physical Exam: General: NAD Heart: RRR Lungs: dec BS at bases Abdomen: dec BS- NGT- being clamped  Extremities: pitting edema      07/21/2017,9:57 AM  LOS: 8 days

## 2017-07-22 ENCOUNTER — Inpatient Hospital Stay (HOSPITAL_COMMUNITY): Payer: 59

## 2017-07-22 LAB — RENAL FUNCTION PANEL
Albumin: 2.1 g/dL — ABNORMAL LOW (ref 3.5–5.0)
Anion gap: 16 — ABNORMAL HIGH (ref 5–15)
BUN: 64 mg/dL — ABNORMAL HIGH (ref 6–20)
CO2: 18 mmol/L — ABNORMAL LOW (ref 22–32)
Calcium: 8.7 mg/dL — ABNORMAL LOW (ref 8.9–10.3)
Chloride: 100 mmol/L — ABNORMAL LOW (ref 101–111)
Creatinine, Ser: 12.49 mg/dL — ABNORMAL HIGH (ref 0.61–1.24)
GFR calc Af Amer: 5 mL/min — ABNORMAL LOW (ref >60)
GFR calc non Af Amer: 5 mL/min — ABNORMAL LOW (ref >60)
Glucose, Bld: 96 mg/dL (ref 65–99)
Phosphorus: 8 mg/dL — ABNORMAL HIGH (ref 2.5–4.6)
Potassium: 3.5 mmol/L (ref 3.5–5.1)
Sodium: 134 mmol/L — ABNORMAL LOW (ref 135–145)

## 2017-07-22 LAB — CBC
HCT: 28.1 % — ABNORMAL LOW (ref 39.0–52.0)
Hemoglobin: 9.9 g/dL — ABNORMAL LOW (ref 13.0–17.0)
MCH: 31.4 pg (ref 26.0–34.0)
MCHC: 35.2 g/dL (ref 30.0–36.0)
MCV: 89.2 fL (ref 78.0–100.0)
Platelets: 552 10*3/uL — ABNORMAL HIGH (ref 150–400)
RBC: 3.15 MIL/uL — ABNORMAL LOW (ref 4.22–5.81)
RDW: 12.5 % (ref 11.5–15.5)
WBC: 18.4 10*3/uL — ABNORMAL HIGH (ref 4.0–10.5)

## 2017-07-22 LAB — HEPATIC FUNCTION PANEL
ALT: 20 U/L (ref 17–63)
AST: 19 U/L (ref 15–41)
Albumin: 2.2 g/dL — ABNORMAL LOW (ref 3.5–5.0)
Alkaline Phosphatase: 85 U/L (ref 38–126)
Bilirubin, Direct: 0.1 mg/dL (ref 0.1–0.5)
Indirect Bilirubin: 0.8 mg/dL (ref 0.3–0.9)
Total Bilirubin: 0.9 mg/dL (ref 0.3–1.2)
Total Protein: 5.3 g/dL — ABNORMAL LOW (ref 6.5–8.1)

## 2017-07-22 LAB — LIPASE, BLOOD: Lipase: 329 U/L — ABNORMAL HIGH (ref 11–51)

## 2017-07-22 MED ORDER — IOPAMIDOL (ISOVUE-300) INJECTION 61%
INTRAVENOUS | Status: AC
  Filled 2017-07-22: qty 30

## 2017-07-22 MED ORDER — SIMETHICONE 80 MG PO CHEW: 80.0000 mg | CHEWABLE_TABLET | Freq: Four times a day (QID) | ORAL | Status: DC | PRN

## 2017-07-22 MED ORDER — IOPAMIDOL (ISOVUE-300) INJECTION 61%
INTRAVENOUS | Status: AC
  Administered 2017-07-22: 12:00:00
  Filled 2017-07-22: qty 30

## 2017-07-22 MED ORDER — BOOST / RESOURCE BREEZE PO LIQD CUSTOM
1.0000 | Freq: Two times a day (BID) | ORAL | Status: DC
  Administered 2017-07-23 – 2017-08-05 (×15): 1 via ORAL

## 2017-07-22 NOTE — Progress Notes (Signed)
Received a call from CT/Radiology on results of the Pt's CT can this afternoon.  Pt has a possible abscess and Radiology is recommending additional testing.  I have paged covering MD of results for their review.

## 2017-07-22 NOTE — Progress Notes (Signed)
Spoke to Dr. Dema Severin about the CT scan, Per Dr. Dema Severin, he has reviewed the results, advised that they will continue with the antibiotics with the Pt and continue to monitor the Pt at this time.  Also, the pt's wound was healing up very nicely, and the depth was less then 0.5 inches.  I text Ferdinand Cava, PA and advised her of the situation and advised her that Nursing staff and the Pt felt that wound vac was not needed and wet to dry dressings would be appropriate, with BID dressing changes.  Claiborne Billings agreed and gave verbal orders to stop all Wound vac orders and Moist to dry dressing to midline incision BID. Advised again the Pt was in agreement with this this.

## 2017-07-22 NOTE — Progress Notes (Signed)
Subjective:  Only 455 of UOP recorded, crt up again - GI issues seemed to be improving- he is more up and around, feels better.  Maybe a little more tired today  Objective Vital signs in last 24 hours: Vitals:   07/21/17 1336 07/21/17 1657 07/21/17 2238 07/22/17 0554  BP: 136/87  136/89 (!) 144/92  Pulse: 72  69 81  Resp: 18  18 16   Temp: 98.8 F (37.1 C)  98.6 F (37 C) 98.5 F (36.9 C)  TempSrc: Oral  Oral Oral  SpO2: 99%  96% 97%  Weight:  83.5 kg (184 lb 1.6 oz)    Height:       Weight change:   Intake/Output Summary (Last 24 hours) at 07/22/2017 1423 Last data filed at 07/22/2017 1336 Gross per 24 hour  Intake 1202 ml  Output 910 ml  Net 292 ml    Assessment/ Plan: Pt is a 37 y.o. yo male who was admitted with perf viscous, s/p surgical repair  on 07/13/2017 with  Now with AKI  Assessment/Plan: 1. AKI-  in the setting of abdominal surgery and contrast as well as vanc- urine pretty bland- U/S good sized kidneys- echogenic - crt pre insult was 1.24.  Nonoliguric- there have been no absolute indications for HD - still just watching and waiting-  still hopeful for eventual recovery - since he is clinically feeling better I am still Ok with obs 2. HTN/vol- tank is full- nonoliguric- did respond to lasix but do not feel inclined to redose at this time-have stopped  IV bicarb  3. Anemia- situational- follow  4. Metabolic acidosis- stable   5. Phos up but stable - no action yet    Kathelyn Gombos A    Labs: Basic Metabolic Panel: Recent Labs  Lab 07/20/17 0411 07/21/17 0446 07/22/17 0453  NA 136 137 134*  K 3.8 3.9 3.5  CL 100* 101 100*  CO2 19* 20* 18*  GLUCOSE 116* 95 96  BUN 51* 59* 64*  CREATININE 10.38* 11.19* 12.49*  CALCIUM 8.5* 8.7* 8.7*  PHOS 7.0* 7.7* 8.0*   Liver Function Tests: Recent Labs  Lab 07/21/17 0446 07/22/17 0453 07/22/17 0802  AST  --   --  19  ALT  --   --  20  ALKPHOS  --   --  85  BILITOT  --   --  0.9  PROT  --   --  5.3*   ALBUMIN 2.1* 2.1* 2.2*   Recent Labs  Lab 07/22/17 0802  LIPASE 329*   No results for input(s): AMMONIA in the last 168 hours. CBC: Recent Labs  Lab 07/18/17 0932 07/19/17 0244 07/20/17 0411 07/21/17 0446 07/22/17 0453  WBC 23.9* 17.0* 17.3* 17.6* 18.4*  HGB 10.1* 9.5* 10.2* 9.9* 9.9*  HCT 30.2* 28.2* 30.6* 28.7* 28.1*  MCV 93.8 93.1 92.7 90.5 89.2  PLT 440* 425* 508* 549* 552*   Cardiac Enzymes: No results for input(s): CKTOTAL, CKMB, CKMBINDEX, TROPONINI in the last 168 hours. CBG: No results for input(s): GLUCAP in the last 168 hours.  Iron Studies: No results for input(s): IRON, TIBC, TRANSFERRIN, FERRITIN in the last 72 hours. Studies/Results: No results found. Medications: Infusions: . famotidine (PEPCID) IV Stopped (07/22/17 1110)  . methocarbamol (ROBAXIN)  IV 500 mg (07/22/17 1408)  . piperacillin-tazobactam (ZOSYN)  IV Stopped (07/22/17 0915)    Scheduled Medications: . iopamidol      . enoxaparin (LOVENOX) injection  30 mg Subcutaneous Q24H  . feeding supplement  1 Container  Oral BID BM  . iopamidol        have reviewed scheduled and prn medications.  Physical Exam: General: NAD Heart: RRR Lungs: dec BS at bases Abdomen: dec BS- NGT- being clamped  Extremities: pitting edema      07/22/2017,2:23 PM  LOS: 9 days

## 2017-07-22 NOTE — Progress Notes (Signed)
[facility] Progress Note  7 Days Post-Op  Subjective: CC-  Patient states that he feels more tired/groggy this morning. Abdominal pain about the same. He was having some lower crampy abdominal pain earlier, but this has resolved. Denies any more n/v. Tolerating clears. Passing flatus and had 3 BMs yesterday. WBC slightly up today, afebrile. BUN/Cr continues to rise. He had about 455cc UOP last 24 hours. JP drain with persistent copious serosanguinous output.  IV team able to place a new peripheral line yesterday.  Objective: Vital signs in last 24 hours: Temp:  [98.5 F (36.9 C)-98.8 F (37.1 C)] 98.5 F (36.9 C) (05/17 0554) Pulse Rate:  [69-81] 81 (05/17 0554) Resp:  [16-18] 16 (05/17 0554) BP: (136-144)/(87-92) 144/92 (05/17 0554) SpO2:  [96 %-99 %] 97 % (05/17 0554) Weight:  [83.5 kg (184 lb 1.6 oz)] 83.5 kg (184 lb 1.6 oz) (05/16 1657) Last BM Date: 07/21/17  Intake/Output from previous day: 05/16 0701 - 05/17 0700 In: 1099 [P.O.:884; IV Piggyback:215] Out: 1335 [Urine:455; Drains:880] Intake/Output this shift: No intake/output data recorded.  PE: Gen:  Alert, NAD, pleasant HEENT: EOM's intact, pupils equal and round Card:  RRR, no M/G/R heard Pulm:  CTAB, no W/R/R, effort normal Abd: Soft, mild distension, +BS, vac to midline, drain with serosanguinous drainage Ext:  Calves soft and nontender, no edema. 1+ edema BUE Psych: A&Ox3  Skin: no rashes noted, warm and dry  Lab Results:  Recent Labs    07/21/17 0446 07/22/17 0453  WBC 17.6* 18.4*  HGB 9.9* 9.9*  HCT 28.7* 28.1*  PLT 549* 552*   BMET Recent Labs    07/21/17 0446 07/22/17 0453  NA 137 134*  K 3.9 3.5  CL 101 100*  CO2 20* 18*  GLUCOSE 95 96  BUN 59* 64*  CREATININE 11.19* 12.49*  CALCIUM 8.7* 8.7*   PT/INR No results for input(s): LABPROT, INR in the last 72 hours. CMP     Component Value Date/Time   NA 134 (L) 07/22/2017 0453   K 3.5 07/22/2017 0453   CL 100 (L)  07/22/2017 0453   CO2 18 (L) 07/22/2017 0453   GLUCOSE 96 07/22/2017 0453   BUN 64 (H) 07/22/2017 0453   CREATININE 12.49 (H) 07/22/2017 0453   CALCIUM 8.7 (L) 07/22/2017 0453   PROT 7.2 07/13/2017 1304   ALBUMIN 2.1 (L) 07/22/2017 0453   AST 20 07/13/2017 1304   ALT 19 07/13/2017 1304   ALKPHOS 90 07/13/2017 1304   BILITOT 1.4 (H) 07/13/2017 1304   GFRNONAA 5 (L) 07/22/2017 0453   GFRAA 5 (L) 07/22/2017 0453   Lipase     Component Value Date/Time   LIPASE 26 07/13/2017 1304       Studies/Results: No results found.  Anti-infectives: Anti-infectives (From admission, onward)   Start     Dose/Rate Route Frequency Ordered Stop   07/16/17 1600  piperacillin-tazobactam (ZOSYN) IVPB 2.25 g     2.25 g 100 mL/hr over 30 Minutes Intravenous Every 8 hours 07/16/17 1213     07/15/17 1445  vancomycin (VANCOCIN) IVPB 750 mg/150 ml premix  Status:  Discontinued     750 mg 150 mL/hr over 60 Minutes Intravenous To Surgery 07/15/17 1437 07/15/17 2041   07/14/17 2300  piperacillin-tazobactam (ZOSYN) IVPB 3.375 g  Status:  Discontinued     3.375 g 12.5 mL/hr over 240 Minutes Intravenous Every 8 hours 07/13/17 1930 07/14/17 0858   07/14/17 0900  piperacillin-tazobactam (ZOSYN) IVPB 3.375 g  Status:  Discontinued  3.375 g 12.5 mL/hr over 240 Minutes Intravenous Every 8 hours 07/14/17 0858 07/16/17 1213   07/14/17 0445  vancomycin (VANCOCIN) IVPB 750 mg/150 ml premix  Status:  Discontinued     750 mg 150 mL/hr over 60 Minutes Intravenous Every 8 hours 07/14/17 0431 07/16/17 0856   07/13/17 1900  piperacillin-tazobactam (ZOSYN) IVPB 3.375 g  Status:  Discontinued     3.375 g 12.5 mL/hr over 240 Minutes Intravenous Every 8 hours 07/13/17 1847 07/13/17 1930   07/13/17 1530  piperacillin-tazobactam (ZOSYN) IVPB 3.375 g     3.375 g 100 mL/hr over 30 Minutes Intravenous  Once 07/13/17 1515 07/13/17 1636       Assessment/Plan Prior h/o GI bleed with no known cause, workup  unrevealing  Complexintra-abdominal abscess S/p ex lap SBR and drainage of intra-abdominal abscess 5/10 Dr. 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 -bicarb stopped 5/14 -UOP 440cc last 24 hours  ID -zosyn at renal adjusted dose 5/8>> VTE -SCDs/Lovenox (30mg ) FEN -fulls, Boost Foley -d/c 5/15, strict I&Os Follow up -Dr. Redmond Pulling  Plan -Ok to sip on fulls today. Add Boost. Continue ambulating. Will order repeat CT scan with oral contrast due to elevated WBC and high JP drain output. Check LFTs and lipase. BUN/Cr up again today, ZLD [id number]; will await nephrology recommendations.   LOS: 9 days    Wellington Hampshire , [facility] 07/22/2017, 8:28 AM Pager: [phone] Consults: [phone] Mon-Fri 7:00 am-4:30 pm Sat-Sun 7:00 am-11:30 am

## 2017-07-23 LAB — CBC
HCT: 25.9 % — ABNORMAL LOW (ref 39.0–52.0)
Hemoglobin: 9.2 g/dL — ABNORMAL LOW (ref 13.0–17.0)
MCH: 31.4 pg (ref 26.0–34.0)
MCHC: 35.5 g/dL (ref 30.0–36.0)
MCV: 88.4 fL (ref 78.0–100.0)
Platelets: 565 10*3/uL — ABNORMAL HIGH (ref 150–400)
RBC: 2.93 MIL/uL — ABNORMAL LOW (ref 4.22–5.81)
RDW: 12.5 % (ref 11.5–15.5)
WBC: 17.2 10*3/uL — ABNORMAL HIGH (ref 4.0–10.5)

## 2017-07-23 LAB — RENAL FUNCTION PANEL
Albumin: 2 g/dL — ABNORMAL LOW (ref 3.5–5.0)
Anion gap: 18 — ABNORMAL HIGH (ref 5–15)
BUN: 72 mg/dL — ABNORMAL HIGH (ref 6–20)
CO2: 17 mmol/L — ABNORMAL LOW (ref 22–32)
Calcium: 8.8 mg/dL — ABNORMAL LOW (ref 8.9–10.3)
Chloride: 99 mmol/L — ABNORMAL LOW (ref 101–111)
Creatinine, Ser: 13.91 mg/dL — ABNORMAL HIGH (ref 0.61–1.24)
GFR calc Af Amer: 5 mL/min — ABNORMAL LOW (ref >60)
GFR calc non Af Amer: 4 mL/min — ABNORMAL LOW (ref >60)
Glucose, Bld: 101 mg/dL — ABNORMAL HIGH (ref 65–99)
Phosphorus: 9.5 mg/dL — ABNORMAL HIGH (ref 2.5–4.6)
Potassium: 3.7 mmol/L (ref 3.5–5.1)
Sodium: 134 mmol/L — ABNORMAL LOW (ref 135–145)

## 2017-07-23 MED ORDER — ACETAMINOPHEN 325 MG PO TABS
650.0000 mg | ORAL_TABLET | Freq: Four times a day (QID) | ORAL | Status: DC | PRN
  Administered 2017-07-23 – 2017-08-02 (×13): 650 mg via ORAL
  Filled 2017-07-23 (×14): qty 2

## 2017-07-23 MED ORDER — FENTANYL CITRATE (PF) 100 MCG/2ML IJ SOLN: 50.0000 ug | INTRAMUSCULAR | Status: DC | PRN

## 2017-07-23 MED ORDER — OXYCODONE HCL 5 MG PO TABS
5.0000 mg | ORAL_TABLET | ORAL | Status: DC | PRN
  Administered 2017-07-23 (×2): 10 mg via ORAL
  Administered 2017-07-23: 5 mg via ORAL
  Administered 2017-07-23 – 2017-07-31 (×15): 10 mg via ORAL
  Administered 2017-08-03: 5 mg via ORAL
  Filled 2017-07-23 (×7): qty 2
  Filled 2017-07-23: qty 1
  Filled 2017-07-23 (×3): qty 2
  Filled 2017-07-23: qty 1
  Filled 2017-07-23 (×9): qty 2

## 2017-07-23 NOTE — Progress Notes (Signed)
[facility] Progress Note  8 Days Post-Op  Subjective: CC-  No complaints this morning. States that he urinated 275cc just this morning. He is passing flatus and had a BM. Tolerating fulls. Appetite slowly improving.  Labs pending.  CT yesterday shows no issues with anastomosis; small pelvic fluid collection.  Objective: Vital signs in last 24 hours: Temp:  [97.4 F (36.3 C)-98.6 F (37 C)] 98.6 F (37 C) (05/18 0515) Pulse Rate:  [63-76] 76 (05/18 0515) Resp:  [16] 16 (05/18 0515) BP: (130-143)/(85-86) 143/85 (05/18 0515) SpO2:  [95 %-97 %] 95 % (05/18 0515) Last BM Date: 07/22/17  Intake/Output from previous day: 05/17 0701 - 05/18 0700 In: 930 [P.O.:240; IV Piggyback:365] Out: 525 [Urine:325; Drains:200] Intake/Output this shift: No intake/output data recorded.  PE: Gen: Alert, NAD, pleasant HEENT: EOM's intact, pupils equal and round Card: RRR, no M/G/R heard Pulm: CTAB, no W/R/R, effort normal Abd: Soft,ND, +BS,NT, midline incision cdi with beefy red tissue/very shallow/no erythema or drainage, drain with serous drainage [id number] soft and nontender, no edema. 1+ edema BUE (slightly improved from yesterday) Psych: A&Ox3  Skin: no rashes noted, warm and dry  Lab Results:  Recent Labs    07/21/17 0446 07/22/17 0453  WBC 17.6* 18.4*  HGB 9.9* 9.9*  HCT 28.7* 28.1*  PLT 549* 552*   BMET Recent Labs    07/21/17 0446 07/22/17 0453  NA 137 134*  K 3.9 3.5  CL 101 100*  CO2 20* 18*  GLUCOSE 95 96  BUN 59* 64*  CREATININE 11.19* 12.49*  CALCIUM 8.7* 8.7*   PT/INR No results for input(s): LABPROT, INR in the last 72 hours. CMP     Component Value Date/Time   NA 134 (L) 07/22/2017 0453   K 3.5 07/22/2017 0453   CL 100 (L) 07/22/2017 0453   CO2 18 (L) 07/22/2017 0453   GLUCOSE 96 07/22/2017 0453   BUN 64 (H) 07/22/2017 0453   CREATININE 12.49 (H) 07/22/2017 0453   CALCIUM 8.7 (L) 07/22/2017 0453   PROT 5.3 (L) 07/22/2017  0802   ALBUMIN 2.2 (L) 07/22/2017 0802   AST 19 07/22/2017 0802   ALT 20 07/22/2017 0802   ALKPHOS 85 07/22/2017 0802   BILITOT 0.9 07/22/2017 0802   GFRNONAA 5 (L) 07/22/2017 0453   GFRAA 5 (L) 07/22/2017 0453   Lipase     Component Value Date/Time   LIPASE 329 (H) 07/22/2017 0802       Studies/Results: Ct Abdomen Pelvis Wo Contrast  Result Date: 07/22/2017 CLINICAL DATA:  Patient status post bowel resection for abdominal abscess and gastrointestinal stromal tumor EXAM: CT ABDOMEN AND PELVIS WITHOUT CONTRAST TECHNIQUE: Multidetector CT imaging of the abdomen and pelvis was performed following the standard protocol without IV contrast. COMPARISON:  Pre-surgical CT 07/14/2017 FINDINGS: Lower chest: Bilateral small moderate effusions. Hepatobiliary: No focal hepatic lesions noncontrast exam. Gallbladder normal. Pancreas: The pancreas is grossly normal on this noncontrast exam. Spleen: Normal spleen Adrenals/urinary tract: Adrenal glands kidneys, ureters and bladder normal is noncontrast exam Stomach/Bowel: The surgical training enters the LEFT anterior abdominal wall and courses along the duodenum with tip the midline at the level of the umbilicus. Interval removal/resolution of the large fluid collection/abscess. Oral contrast enters the stomach and duodenum in small bowel without obstruction or leak. Contrast reaches the ascending colon. Again no obstruction or leak is present. Minimal tiny foci of intraperitoneal gas related to recent surgery. Contrast traverses the entire colon to the rectum. Vascular/Lymphatic: Abdominal aorta is normal  caliber. No periportal or retroperitoneal adenopathy. No pelvic adenopathy. Reproductive: Prostate normal Other: Within the deep RIGHT pelvis the superior to the seminal vesicles and posterosuperior to the bladder is a rounded fluid collection measuring 4.3 by 4.4 cm which is new from pre-surgical comparison. This collection is intermediate density (Hounsfield  units equal 22) and has some mild mass effect upon the adjacent bowel suggesting organization. Musculoskeletal: No aggressive osseous lesion IMPRESSION: 1. Round intermediate density collection in the deep RIGHT pelvis anterior to the rectum and superior to the seminal vesicles. Different differential include sterile postsurgical fluid versus abscess. Collection does have mild mass effect upon adjacent bowel suggesting organization (abscess). A contrast enhanced CT may differentiate when renal function permits. 2. No upper abdomen complication following abscess/gastrointestinal stromal tumor resection from small bowel. 3. No evidence of bowel obstruction or leak of oral contrast with oral contrast transiting to the rectum. 4. Small bilateral pleural effusions. These results will be called to the ordering clinician or representative by the Radiologist Assistant, and communication documented in the PACS or zVision Dashboard. Electronically Signed   By: Suzy Bouchard M.D.   On: 07/22/2017 17:23    Anti-infectives: Anti-infectives (From admission, onward)   Start     Dose/Rate Route Frequency Ordered Stop   07/16/17 1600  piperacillin-tazobactam (ZOSYN) IVPB 2.25 g     2.25 g 100 mL/hr over 30 Minutes Intravenous Every 8 hours 07/16/17 1213     07/15/17 1445  vancomycin (VANCOCIN) IVPB 750 mg/150 ml premix  Status:  Discontinued     750 mg 150 mL/hr over 60 Minutes Intravenous To Surgery 07/15/17 1437 07/15/17 2041   07/14/17 2300  piperacillin-tazobactam (ZOSYN) IVPB 3.375 g  Status:  Discontinued     3.375 g 12.5 mL/hr over 240 Minutes Intravenous Every 8 hours 07/13/17 1930 07/14/17 0858   07/14/17 0900  piperacillin-tazobactam (ZOSYN) IVPB 3.375 g  Status:  Discontinued     3.375 g 12.5 mL/hr over 240 Minutes Intravenous Every 8 hours 07/14/17 0858 07/16/17 1213   07/14/17 0445  vancomycin (VANCOCIN) IVPB 750 mg/150 ml premix  Status:  Discontinued     750 mg 150 mL/hr over 60 Minutes  Intravenous Every 8 hours 07/14/17 0431 07/16/17 0856   07/13/17 1900  piperacillin-tazobactam (ZOSYN) IVPB 3.375 g  Status:  Discontinued     3.375 g 12.5 mL/hr over 240 Minutes Intravenous Every 8 hours 07/13/17 1847 07/13/17 1930   07/13/17 1530  piperacillin-tazobactam (ZOSYN) IVPB 3.375 g     3.375 g 100 mL/hr over 30 Minutes Intravenous  Once 07/13/17 1515 07/13/17 1636       Assessment/Plan Prior h/o GI bleed with no known cause, workup unrevealing  Complexintra-abdominal abscess S/pex lapSBR and drainage of intra-abdominal abscess5/10 Dr. 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; TRANSMURAL DEFECT; INFLAMED OMENTUM; THE SURGICAL RESECTION MARGINS ARE NEGATIVE FOR TUMOR  - VAC d/c 5/17, now W2D dressing changes - tolerating fulls and having bowel function - JP drain with increased output 200cc serous drainage; sent for creatinine 12 (same as serum) and amylase 276 - serum lipase 329 which could represent pancreatitis, but no findings of this on CT and patient has no upper abdominal pain - CT 5/17 showed Round intermediate density collection in the deep RIGHT pelvis, abscess vs normal postop fluid; No upper abdomen complication following abscess/gastrointestinal stromal tumor resection from small bowel; no leak  ARF -appreciate nephrology's assistance -cr up to>12 (5/17), labs pending this morning -UOP325cc last 24  hours + 275cc this AM  ID -zosynat renal adjusted dose5/8>> VTE -SCDs/Lovenox (30mg ) FEN -soft diet, Boost Foley -d/c 5/15, strict I&Os Follow up -Dr. Redmond Pulling  Plan -Labs pending. UOP increasing. Advance to soft diet as tolerated. Add oral pain medications. Continue ambulating. Will wait on IR drainage of pelvic fluid collection for now, continue zosyn. Appreciate nephrology assistance.    LOS: 10 days    Wellington Hampshire , [facility] 07/23/2017, 8:15 AM Pager: [phone] Consults:  [phone] Mon-Fri 7:00 am-4:30 pm Sat-Sun 7:00 am-11:30 am

## 2017-07-23 NOTE — Progress Notes (Signed)
Subjective:  Only 325 of UOP recorded, but he say more today - crt up again - GI issues seemed to be improving- he is more up and around, feels better.  Maybe a little more tired  Objective Vital signs in last 24 hours: Vitals:   07/22/17 0554 07/22/17 1425 07/22/17 2120 07/23/17 0515  BP: (!) 144/92 130/85 137/86 (!) 143/85  Pulse: 81 76 63 76  Resp: 16 16 16 16   Temp: 98.5 F (36.9 C) (!) 97.4 F (36.3 C) 98.6 F (37 C) 98.6 F (37 C)  TempSrc: Oral Oral Oral Oral  SpO2: 97% 95% 97% 95%  Weight:      Height:       Weight change:   Intake/Output Summary (Last 24 hours) at 07/23/2017 1107 Last data filed at 07/23/2017 0600 Gross per 24 hour  Intake 680 ml  Output 525 ml  Net 155 ml    Assessment/ Plan: Pt is a 37 y.o. yo male who was admitted with perf viscous, s/p surgical repair  on 07/13/2017 with  Now with AKI  Assessment/Plan: 1. AKI-  in the setting of abdominal surgery and contrast as well as vanc- urine pretty bland- U/S good sized kidneys- echogenic - crt pre insult was 1.24.  Nonoliguric- there have been no absolute indications for HD - still just watching and waiting-  still hopeful for eventual recovery - since he is clinically feeling better I am still Ok with obs.  What I have discussed with him today is that if we do not see this turning around by Monday anticipate at that time a BUN in the 90s and then think we should proceed with a vascath and HD to improve uremia.  Still hopeful for eventual recovery  2. HTN/vol- tank is full- nonoliguric- did respond to lasix but do not feel inclined to redose at this time-have stopped  IV bicarb  3. Anemia- situational- follow  4. Metabolic acidosis- stable   5. Phos up - no action yet - do not want to introduce binder into tenuous GI situation    Taylin Mans A    Labs: Basic Metabolic Panel: Recent Labs  Lab 07/21/17 0446 07/22/17 0453 07/23/17 0645  NA 137 134* 134*  K 3.9 3.5 3.7  CL 101 100* 99*  CO2 20*  18* 17*  GLUCOSE 95 96 101*  BUN 59* 64* 72*  CREATININE 11.19* 12.49* 13.91*  CALCIUM 8.7* 8.7* 8.8*  PHOS 7.7* 8.0* 9.5*   Liver Function Tests: Recent Labs  Lab 07/22/17 0453 07/22/17 0802 07/23/17 0645  AST  --  19  --   ALT  --  20  --   ALKPHOS  --  85  --   BILITOT  --  0.9  --   PROT  --  5.3*  --   ALBUMIN 2.1* 2.2* 2.0*   Recent Labs  Lab 07/22/17 0802  LIPASE 329*   No results for input(s): AMMONIA in the last 168 hours. CBC: Recent Labs  Lab 07/19/17 0244 07/20/17 0411 07/21/17 0446 07/22/17 0453 07/23/17 0645  WBC 17.0* 17.3* 17.6* 18.4* 17.2*  HGB 9.5* 10.2* 9.9* 9.9* 9.2*  HCT 28.2* 30.6* 28.7* 28.1* 25.9*  MCV 93.1 92.7 90.5 89.2 88.4  PLT 425* 508* 549* 552* 565*   Cardiac Enzymes: No results for input(s): CKTOTAL, CKMB, CKMBINDEX, TROPONINI in the last 168 hours. CBG: No results for input(s): GLUCAP in the last 168 hours.  Iron Studies: No results for input(s): IRON, TIBC, TRANSFERRIN, FERRITIN in  the last 72 hours. Studies/Results: Ct Abdomen Pelvis Wo Contrast  Result Date: 07/22/2017 CLINICAL DATA:  Patient status post bowel resection for abdominal abscess and gastrointestinal stromal tumor EXAM: CT ABDOMEN AND PELVIS WITHOUT CONTRAST TECHNIQUE: Multidetector CT imaging of the abdomen and pelvis was performed following the standard protocol without IV contrast. COMPARISON:  Pre-surgical CT 07/14/2017 FINDINGS: Lower chest: Bilateral small moderate effusions. Hepatobiliary: No focal hepatic lesions noncontrast exam. Gallbladder normal. Pancreas: The pancreas is grossly normal on this noncontrast exam. Spleen: Normal spleen Adrenals/urinary tract: Adrenal glands kidneys, ureters and bladder normal is noncontrast exam Stomach/Bowel: The surgical training enters the LEFT anterior abdominal wall and courses along the duodenum with tip the midline at the level of the umbilicus. Interval removal/resolution of the large fluid collection/abscess. Oral  contrast enters the stomach and duodenum in small bowel without obstruction or leak. Contrast reaches the ascending colon. Again no obstruction or leak is present. Minimal tiny foci of intraperitoneal gas related to recent surgery. Contrast traverses the entire colon to the rectum. Vascular/Lymphatic: Abdominal aorta is normal caliber. No periportal or retroperitoneal adenopathy. No pelvic adenopathy. Reproductive: Prostate normal Other: Within the deep RIGHT pelvis the superior to the seminal vesicles and posterosuperior to the bladder is a rounded fluid collection measuring 4.3 by 4.4 cm which is new from pre-surgical comparison. This collection is intermediate density (Hounsfield units equal 22) and has some mild mass effect upon the adjacent bowel suggesting organization. Musculoskeletal: No aggressive osseous lesion IMPRESSION: 1. Round intermediate density collection in the deep RIGHT pelvis anterior to the rectum and superior to the seminal vesicles. Different differential include sterile postsurgical fluid versus abscess. Collection does have mild mass effect upon adjacent bowel suggesting organization (abscess). A contrast enhanced CT may differentiate when renal function permits. 2. No upper abdomen complication following abscess/gastrointestinal stromal tumor resection from small bowel. 3. No evidence of bowel obstruction or leak of oral contrast with oral contrast transiting to the rectum. 4. Small bilateral pleural effusions. These results will be called to the ordering clinician or representative by the Radiologist Assistant, and communication documented in the PACS or zVision Dashboard. Electronically Signed   By: Suzy Bouchard M.D.   On: 07/22/2017 17:23   Medications: Infusions: . famotidine (PEPCID) IV 20 mg (07/23/17 1028)  . methocarbamol (ROBAXIN)  IV Stopped (07/23/17 0532)  . piperacillin-tazobactam (ZOSYN)  IV 2.25 g (07/23/17 8676)    Scheduled Medications: . enoxaparin  (LOVENOX) injection  30 mg Subcutaneous Q24H  . feeding supplement  1 Container Oral BID BM    have reviewed scheduled and prn medications.  Physical Exam: General: NAD Heart: RRR Lungs: dec BS at bases Abdomen: dec BS- NGT-out  Extremities: pitting edema      07/23/2017,11:07 AM  LOS: 10 days

## 2017-07-24 LAB — CBC
HCT: 24.9 % — ABNORMAL LOW (ref 39.0–52.0)
Hemoglobin: 9 g/dL — ABNORMAL LOW (ref 13.0–17.0)
MCH: 32 pg (ref 26.0–34.0)
MCHC: 36.1 g/dL — ABNORMAL HIGH (ref 30.0–36.0)
MCV: 88.6 fL (ref 78.0–100.0)
Platelets: 580 10*3/uL — ABNORMAL HIGH (ref 150–400)
RBC: 2.81 MIL/uL — ABNORMAL LOW (ref 4.22–5.81)
RDW: 12.8 % (ref 11.5–15.5)
WBC: 14.8 10*3/uL — ABNORMAL HIGH (ref 4.0–10.5)

## 2017-07-24 LAB — RENAL FUNCTION PANEL
Albumin: 2.1 g/dL — ABNORMAL LOW (ref 3.5–5.0)
Anion gap: 18 — ABNORMAL HIGH (ref 5–15)
BUN: 81 mg/dL — ABNORMAL HIGH (ref 6–20)
CO2: 15 mmol/L — ABNORMAL LOW (ref 22–32)
Calcium: 9.1 mg/dL (ref 8.9–10.3)
Chloride: 98 mmol/L — ABNORMAL LOW (ref 101–111)
Creatinine, Ser: 15.17 mg/dL — ABNORMAL HIGH (ref 0.61–1.24)
GFR calc Af Amer: 4 mL/min — ABNORMAL LOW (ref >60)
GFR calc non Af Amer: 4 mL/min — ABNORMAL LOW (ref >60)
Glucose, Bld: 97 mg/dL (ref 65–99)
Phosphorus: 11.1 mg/dL — ABNORMAL HIGH (ref 2.5–4.6)
Potassium: 3.5 mmol/L (ref 3.5–5.1)
Sodium: 131 mmol/L — ABNORMAL LOW (ref 135–145)

## 2017-07-24 MED ORDER — DARBEPOETIN ALFA 100 MCG/0.5ML IJ SOSY
100.0000 ug | PREFILLED_SYRINGE | INTRAMUSCULAR | Status: DC
  Filled 2017-07-24: qty 0.5

## 2017-07-24 MED ORDER — DIAZEPAM 5 MG PO TABS
5.0000 mg | ORAL_TABLET | Freq: Three times a day (TID) | ORAL | Status: DC | PRN
  Administered 2017-07-24 – 2017-08-03 (×9): 5 mg via ORAL
  Filled 2017-07-24 (×10): qty 1

## 2017-07-24 NOTE — Progress Notes (Signed)
[facility] Progress Note  9 Days Post-Op  Subjective: CC-  Sleeping in chair this morning. States that he was having some midabdominal crampy pain earlier, improved now. Passing flatus and had a BM yesterday. States that he ate too much yesterday and vomited once. Denies any current nausea.   Creatinine up to 15 today, UOP 675cc/24hr.  Of note, patient states that he had 2 cousins on his dad's side that required renal transplants for unknown reason.  Objective: Vital signs in last 24 hours: Temp:  [98.1 F (36.7 C)-98.7 F (37.1 C)] 98.4 F (36.9 C) (05/19 0350) Pulse Rate:  [63-72] 72 (05/19 0350) Resp:  [17-18] 18 (05/18 2204) BP: (134-140)/(87-97) 140/97 (05/19 0350) SpO2:  [98 %-100 %] 100 % (05/19 0350) Last BM Date: 07/23/17  Intake/Output from previous day: 05/18 0701 - 05/19 0700 In: 450 [P.O.:350; IV Piggyback:100] Out: 740 [Urine:675; Emesis/NG output:30; Drains:35] Intake/Output this shift: No intake/output data recorded.  PE: Gen: Alert, NAD, pleasant HEENT: EOM's intact, pupils equal and round Card: RRR, no M/G/R heard Pulm: CTAB, no W/R/R, effort normal Abd: Soft,ND, +BS,NT, midline incision cdi with beefy red tissue/very shallow/no erythema or drainage, drain with serous drainage IEP:PIRJJO soft and nontender, no edema. 1+ edema BUE Psych: A&Ox3  Skin: no rashes noted, warm and dry  Lab Results:  Recent Labs    07/23/17 0645 07/24/17 0601  WBC 17.2* 14.8*  HGB 9.2* 9.0*  HCT 25.9* 24.9*  PLT 565* 580*   BMET Recent Labs    07/23/17 0645 07/24/17 0601  NA 134* 131*  K 3.7 3.5  CL 99* 98*  CO2 17* 15*  GLUCOSE 101* 97  BUN 72* 81*  CREATININE 13.91* 15.17*  CALCIUM 8.8* 9.1   PT/INR No results for input(s): LABPROT, INR in the last 72 hours. CMP     Component Value Date/Time   NA 131 (L) 07/24/2017 0601   K 3.5 07/24/2017 0601   CL 98 (L) 07/24/2017 0601   CO2 15 (L) 07/24/2017 0601   GLUCOSE 97 07/24/2017  0601   BUN 81 (H) 07/24/2017 0601   CREATININE 15.17 (H) 07/24/2017 0601   CALCIUM 9.1 07/24/2017 0601   PROT 5.3 (L) 07/22/2017 0802   ALBUMIN 2.1 (L) 07/24/2017 0601   AST 19 07/22/2017 0802   ALT 20 07/22/2017 0802   ALKPHOS 85 07/22/2017 0802   BILITOT 0.9 07/22/2017 0802   GFRNONAA 4 (L) 07/24/2017 0601   GFRAA 4 (L) 07/24/2017 0601   Lipase     Component Value Date/Time   LIPASE 329 (H) 07/22/2017 0802       Studies/Results: Ct Abdomen Pelvis Wo Contrast  Result Date: 07/22/2017 CLINICAL DATA:  Patient status post bowel resection for abdominal abscess and gastrointestinal stromal tumor EXAM: CT ABDOMEN AND PELVIS WITHOUT CONTRAST TECHNIQUE: Multidetector CT imaging of the abdomen and pelvis was performed following the standard protocol without IV contrast. COMPARISON:  Pre-surgical CT 07/14/2017 FINDINGS: Lower chest: Bilateral small moderate effusions. Hepatobiliary: No focal hepatic lesions noncontrast exam. Gallbladder normal. Pancreas: The pancreas is grossly normal on this noncontrast exam. Spleen: Normal spleen Adrenals/urinary tract: Adrenal glands kidneys, ureters and bladder normal is noncontrast exam Stomach/Bowel: The surgical training enters the LEFT anterior abdominal wall and courses along the duodenum with tip the midline at the level of the umbilicus. Interval removal/resolution of the large fluid collection/abscess. Oral contrast enters the stomach and duodenum in small bowel without obstruction or leak. Contrast reaches the ascending colon. Again no obstruction or leak  is present. Minimal tiny foci of intraperitoneal gas related to recent surgery. Contrast traverses the entire colon to the rectum. Vascular/Lymphatic: Abdominal aorta is normal caliber. No periportal or retroperitoneal adenopathy. No pelvic adenopathy. Reproductive: Prostate normal Other: Within the deep RIGHT pelvis the superior to the seminal vesicles and posterosuperior to the bladder is a rounded  fluid collection measuring 4.3 by 4.4 cm which is new from pre-surgical comparison. This collection is intermediate density (Hounsfield units equal 22) and has some mild mass effect upon the adjacent bowel suggesting organization. Musculoskeletal: No aggressive osseous lesion IMPRESSION: 1. Round intermediate density collection in the deep RIGHT pelvis anterior to the rectum and superior to the seminal vesicles. Different differential include sterile postsurgical fluid versus abscess. Collection does have mild mass effect upon adjacent bowel suggesting organization (abscess). A contrast enhanced CT may differentiate when renal function permits. 2. No upper abdomen complication following abscess/gastrointestinal stromal tumor resection from small bowel. 3. No evidence of bowel obstruction or leak of oral contrast with oral contrast transiting to the rectum. 4. Small bilateral pleural effusions. These results will be called to the ordering clinician or representative by the Radiologist Assistant, and communication documented in the PACS or zVision Dashboard. Electronically Signed   By: Suzy Bouchard M.D.   On: 07/22/2017 17:23    Anti-infectives: Anti-infectives (From admission, onward)   Start     Dose/Rate Route Frequency Ordered Stop   07/16/17 1600  piperacillin-tazobactam (ZOSYN) IVPB 2.25 g     2.25 g 100 mL/hr over 30 Minutes Intravenous Every 8 hours 07/16/17 1213     07/15/17 1445  vancomycin (VANCOCIN) IVPB 750 mg/150 ml premix  Status:  Discontinued     750 mg 150 mL/hr over 60 Minutes Intravenous To Surgery 07/15/17 1437 07/15/17 2041   07/14/17 2300  piperacillin-tazobactam (ZOSYN) IVPB 3.375 g  Status:  Discontinued     3.375 g 12.5 mL/hr over 240 Minutes Intravenous Every 8 hours 07/13/17 1930 07/14/17 0858   07/14/17 0900  piperacillin-tazobactam (ZOSYN) IVPB 3.375 g  Status:  Discontinued     3.375 g 12.5 mL/hr over 240 Minutes Intravenous Every 8 hours 07/14/17 0858 07/16/17 1213    07/14/17 0445  vancomycin (VANCOCIN) IVPB 750 mg/150 ml premix  Status:  Discontinued     750 mg 150 mL/hr over 60 Minutes Intravenous Every 8 hours 07/14/17 0431 07/16/17 0856   07/13/17 1900  piperacillin-tazobactam (ZOSYN) IVPB 3.375 g  Status:  Discontinued     3.375 g 12.5 mL/hr over 240 Minutes Intravenous Every 8 hours 07/13/17 1847 07/13/17 1930   07/13/17 1530  piperacillin-tazobactam (ZOSYN) IVPB 3.375 g     3.375 g 100 mL/hr over 30 Minutes Intravenous  Once 07/13/17 1515 07/13/17 1636       Assessment/Plan Prior h/o GI bleed with no known cause, workup unrevealing  Complexintra-abdominal abscess S/pex lapSBR and drainage of intra-abdominal abscess5/10 Dr. Redmond Pulling - POD9 - path:GASTROINTESTINAL STROMAL TUMOR (GIST), SPANNING 1.7 CM; TRANSMURAL DEFECT;INFLAMED OMENTUM;THE SURGICAL RESECTION MARGINS ARE NEGATIVE FOR TUMOR - VAC d/c 5/17, now W2D dressing changes -tolerating soft diet and having bowel function -JPdrain with decreased output35cc serous drainage; sent for creatinine 12 (same as serum) and amylase 276 - serum lipase 329 which could represent pancreatitis, but no findings of this on CT and patient has no upper abdominal pain - CT 5/17 showed Round intermediate density collection in the deep RIGHT pelvis, abscess vs normal postop fluid; No upper abdomen complication following abscess/gastrointestinal stromal tumor resection from small bowel;  no leak - WBC trending down, afebrile  ARF -appreciate nephrology's assistance -cr up to>15 -UOPincreasing 675cc/24hr  ID -zosynat renal adjusted dose5/8>> VTE -SCDs/Lovenox (30mg ) FEN -soft diet, Boost Foley -d/c 5/15, strict I&Os Follow up -Dr. Redmond Pulling  Plan -Continue soft diet. Creatinine still trending up, nephrology likely planning for dialysis tomorrow if no improvement.  Continue IV zosyn.    LOS: 11 days    Wellington [place] , [facility] Surgery 07/24/2017, 8:44  AM Pager: [phone] Consults: [phone] Mon-Fri 7:00 am-4:30 pm Sat-Sun 7:00 am-11:30 am

## 2017-07-24 NOTE — Progress Notes (Signed)
Subjective:  675 of UOP recorded, is more- crt up again - GI issues continue to improve- thinks is starting to feel more fatigued/uremic.  Wife tells me that 2 cousins on his Dads side have ESRD at young age unknown etiology   Objective Vital signs in last 24 hours: Vitals:   07/23/17 0515 07/23/17 1546 07/23/17 2204 07/24/17 0350  BP: (!) 143/85 (!) 134/92 136/87 (!) 140/97  Pulse: 76 63 67 72  Resp: 16 17 18    Temp: 98.6 F (37 C) 98.1 F (36.7 C) 98.7 F (37.1 C) 98.4 F (36.9 C)  TempSrc: Oral Oral Oral Oral  SpO2: 95% 100% 98% 100%  Weight:      Height:       Weight change:   Intake/Output Summary (Last 24 hours) at 07/24/2017 4818 Last data filed at 07/24/2017 5631 Gross per 24 hour  Intake 450 ml  Output 740 ml  Net -290 ml    Assessment/ Plan: Pt is a 37 y.o. yo male who was admitted with perf viscous, s/p surgical repair  on 07/13/2017 with now with AKI  Assessment/Plan: 1. AKI-  in the setting of abdominal surgery/ contrast as well as vanc- urine pretty bland- U/S good sized kidneys- echogenic - crt pre insult was 1.24.  Nonoliguric- there have been no absolute indications for HD - watching and waiting all week,  still hopeful for eventual recovery.  What we have discussed is that since we do not see this turning around and likely uremic sxms we should proceed with a vascath and HD tomorrow to improve uremia, then do PRN- will make tentative plan.  Still hopeful for eventual recovery  2. HTN/vol- tank is full- nonoliguric- did respond to lasix but do not feel inclined to redose at this time-have stopped  IV bicarb - run even with first HD tomorrow so as not to limit recovery  3. Anemia- situational- will add ESA 4. Metabolic acidosis- stable   5. Phos up - no action yet - did not want to introduce binder into tenuous GI situation    Joelle Roswell A    Labs: Basic Metabolic Panel: Recent Labs  Lab 07/22/17 0453 07/23/17 0645 07/24/17 0601  NA 134* 134* 131*   K 3.5 3.7 3.5  CL 100* 99* 98*  CO2 18* 17* 15*  GLUCOSE 96 101* 97  BUN 64* 72* 81*  CREATININE 12.49* 13.91* 15.17*  CALCIUM 8.7* 8.8* 9.1  PHOS 8.0* 9.5* 11.1*   Liver Function Tests: Recent Labs  Lab 07/22/17 0802 07/23/17 0645 07/24/17 0601  AST 19  --   --   ALT 20  --   --   ALKPHOS 85  --   --   BILITOT 0.9  --   --   PROT 5.3*  --   --   ALBUMIN 2.2* 2.0* 2.1*   Recent Labs  Lab 07/22/17 0802  LIPASE 329*   No results for input(s): AMMONIA in the last 168 hours. CBC: Recent Labs  Lab 07/20/17 0411 07/21/17 0446 07/22/17 0453 07/23/17 0645 07/24/17 0601  WBC 17.3* 17.6* 18.4* 17.2* 14.8*  HGB 10.2* 9.9* 9.9* 9.2* 9.0*  HCT 30.6* 28.7* 28.1* 25.9* 24.9*  MCV 92.7 90.5 89.2 88.4 88.6  PLT 508* 549* 552* 565* 580*   Cardiac Enzymes: No results for input(s): CKTOTAL, CKMB, CKMBINDEX, TROPONINI in the last 168 hours. CBG: No results for input(s): GLUCAP in the last 168 hours.  Iron Studies: No results for input(s): IRON, TIBC, TRANSFERRIN, FERRITIN in  the last 72 hours. Studies/Results: Ct Abdomen Pelvis Wo Contrast  Result Date: 07/22/2017 CLINICAL DATA:  Patient status post bowel resection for abdominal abscess and gastrointestinal stromal tumor EXAM: CT ABDOMEN AND PELVIS WITHOUT CONTRAST TECHNIQUE: Multidetector CT imaging of the abdomen and pelvis was performed following the standard protocol without IV contrast. COMPARISON:  Pre-surgical CT 07/14/2017 FINDINGS: Lower chest: Bilateral small moderate effusions. Hepatobiliary: No focal hepatic lesions noncontrast exam. Gallbladder normal. Pancreas: The pancreas is grossly normal on this noncontrast exam. Spleen: Normal spleen Adrenals/urinary tract: Adrenal glands kidneys, ureters and bladder normal is noncontrast exam Stomach/Bowel: The surgical training enters the LEFT anterior abdominal wall and courses along the duodenum with tip the midline at the level of the umbilicus. Interval removal/resolution of  the large fluid collection/abscess. Oral contrast enters the stomach and duodenum in small bowel without obstruction or leak. Contrast reaches the ascending colon. Again no obstruction or leak is present. Minimal tiny foci of intraperitoneal gas related to recent surgery. Contrast traverses the entire colon to the rectum. Vascular/Lymphatic: Abdominal aorta is normal caliber. No periportal or retroperitoneal adenopathy. No pelvic adenopathy. Reproductive: Prostate normal Other: Within the deep RIGHT pelvis the superior to the seminal vesicles and posterosuperior to the bladder is a rounded fluid collection measuring 4.3 by 4.4 cm which is new from pre-surgical comparison. This collection is intermediate density (Hounsfield units equal 22) and has some mild mass effect upon the adjacent bowel suggesting organization. Musculoskeletal: No aggressive osseous lesion IMPRESSION: 1. Round intermediate density collection in the deep RIGHT pelvis anterior to the rectum and superior to the seminal vesicles. Different differential include sterile postsurgical fluid versus abscess. Collection does have mild mass effect upon adjacent bowel suggesting organization (abscess). A contrast enhanced CT may differentiate when renal function permits. 2. No upper abdomen complication following abscess/gastrointestinal stromal tumor resection from small bowel. 3. No evidence of bowel obstruction or leak of oral contrast with oral contrast transiting to the rectum. 4. Small bilateral pleural effusions. These results will be called to the ordering clinician or representative by the Radiologist Assistant, and communication documented in the PACS or zVision Dashboard. Electronically Signed   By: Suzy Bouchard M.D.   On: 07/22/2017 17:23   Medications: Infusions: . famotidine (PEPCID) IV 20 mg (07/24/17 0923)  . methocarbamol (ROBAXIN)  IV 500 mg (07/24/17 0600)  . piperacillin-tazobactam (ZOSYN)  IV Stopped (07/24/17 0849)     Scheduled Medications: . enoxaparin (LOVENOX) injection  30 mg Subcutaneous Q24H  . feeding supplement  1 Container Oral BID BM    have reviewed scheduled and prn medications.  Physical Exam: General: NAD- says more fatigued Heart: RRR Lungs: dec BS at bases Abdomen: dec BS- NGT-out  Extremities: mild pitting edema      07/24/2017,9:27 AM  LOS: 11 days

## 2017-07-25 ENCOUNTER — Encounter (HOSPITAL_COMMUNITY): Payer: Self-pay | Admitting: Interventional Radiology

## 2017-07-25 ENCOUNTER — Ambulatory Visit: Payer: Self-pay | Admitting: General Surgery

## 2017-07-25 ENCOUNTER — Inpatient Hospital Stay (HOSPITAL_COMMUNITY): Payer: 59

## 2017-07-25 HISTORY — PX: IR US GUIDE VASC ACCESS RIGHT: IMG2390

## 2017-07-25 HISTORY — PX: IR FLUORO GUIDE CV LINE RIGHT: IMG2283

## 2017-07-25 LAB — RENAL FUNCTION PANEL
Albumin: 2.1 g/dL — ABNORMAL LOW (ref 3.5–5.0)
Anion gap: 22 — ABNORMAL HIGH (ref 5–15)
BUN: 91 mg/dL — ABNORMAL HIGH (ref 6–20)
CO2: 12 mmol/L — ABNORMAL LOW (ref 22–32)
Calcium: 9.2 mg/dL (ref 8.9–10.3)
Chloride: 97 mmol/L — ABNORMAL LOW (ref 101–111)
Creatinine, Ser: 15.94 mg/dL — ABNORMAL HIGH (ref 0.61–1.24)
GFR calc Af Amer: 4 mL/min — ABNORMAL LOW (ref >60)
GFR calc non Af Amer: 3 mL/min — ABNORMAL LOW (ref >60)
Glucose, Bld: 93 mg/dL (ref 65–99)
Phosphorus: 12.5 mg/dL — ABNORMAL HIGH (ref 2.5–4.6)
Potassium: 3.8 mmol/L (ref 3.5–5.1)
Sodium: 131 mmol/L — ABNORMAL LOW (ref 135–145)

## 2017-07-25 LAB — CBC
HCT: 23.4 % — ABNORMAL LOW (ref 39.0–52.0)
Hemoglobin: 8.2 g/dL — ABNORMAL LOW (ref 13.0–17.0)
MCH: 31.7 pg (ref 26.0–34.0)
MCHC: 35 g/dL (ref 30.0–36.0)
MCV: 90.3 fL (ref 78.0–100.0)
Platelets: 507 10*3/uL — ABNORMAL HIGH (ref 150–400)
RBC: 2.59 MIL/uL — ABNORMAL LOW (ref 4.22–5.81)
RDW: 13 % (ref 11.5–15.5)
WBC: 12.7 10*3/uL — ABNORMAL HIGH (ref 4.0–10.5)

## 2017-07-25 MED ORDER — METHOCARBAMOL 500 MG PO TABS
500.0000 mg | ORAL_TABLET | Freq: Three times a day (TID) | ORAL | Status: DC
  Administered 2017-07-25 – 2017-08-01 (×17): 500 mg via ORAL
  Filled 2017-07-25 (×21): qty 1

## 2017-07-25 MED ORDER — LIDOCAINE HCL 1 % IJ SOLN
INTRAMUSCULAR | Status: AC
  Filled 2017-07-25: qty 20

## 2017-07-25 MED ORDER — HEPARIN SODIUM (PORCINE) 1000 UNIT/ML IJ SOLN
INTRAMUSCULAR | Status: AC
  Administered 2017-07-25: 2.8 mL
  Filled 2017-07-25: qty 1

## 2017-07-25 MED ORDER — LIDOCAINE HCL (PF) 1 % IJ SOLN
INTRAMUSCULAR | Status: DC | PRN
  Administered 2017-07-25: 10 mL

## 2017-07-25 NOTE — Procedures (Signed)
RIJV temp HD cath EBL 0 Comp 0

## 2017-07-25 NOTE — Progress Notes (Signed)
[facility] Rounding Note  Background: 37 y.o. 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 Baseline creatinine 1.24 . Rec'd V/Z on adm, had IV contrast X2 on 5/9. Developed AKI. Temp cath placed 5/20 for initiation of HD for creatinine of 15, uremic sx  Assessment/Plan:  1. AKI-  in the setting of abdominal surgery/IV contrast/vanc. Bland urine. U/S good sized kidneys- echogenic - crt pre insult was 1.24.  Still making some urine (525 past 24 hours). Creatinine however continues to rise, some uremic sx. Vas cath placed in IR today 5/20 and is for HD later today then prn.  Still hopeful for eventual recovery. I think HD today and tomorrow, see how he feels. Prn after that pending recovery. Initial plan was to run even today but now w/gen edema and 10 kg over  admit weight. Change to 1.5 liters 2. S/p xlap for perf bowel/intraabd abscess - path + for GIST clear margins 3. Anemia- situational - added Aranesp to start with HD today. ABLA with dark tarry stools. Hb down to 8.2. Transfuse as needed. No heparin with HD 4. Metabolic acidosis- should improve with HD 5. Hyperphosphatemia  - no action yet - do not want to introduce binder into tenuous GI situation   Subjective:   Creatinine continues to rise Has temp cath done in IR this AM For HD today To start Aranesp with HD Says just feels "yucky" Having BM's dark and tarry   2 cousins on his Dad's side had ESRD at young age unknown etiology (attempted bx of both small shrunken kidneys and both transplanted)   Objective Vital signs in last 24 hours: Vitals:   07/24/17 0350 07/24/17 1415 07/24/17 2124 07/25/17 0546  BP: (!) 140/97 (!) 136/95 (!) 144/96 (!) 139/92  Pulse: 72 66 66 77  Resp:  16 18 16   Temp: 98.4 F (36.9 C) 98.3 F (36.8 C) 98.1 F (36.7 C) 99 F (37.2 C)  TempSrc: Oral Oral Oral Oral  SpO2: 100% 100% 100% 99%  Weight:      Height:        Weight change:   Intake/Output Summary (Last 24 hours) at 07/25/2017 1258 Last data filed at 07/25/2017 1100 Gross per 24 hour  Intake 1390 ml  Output 555 ml  Net 835 ml   Physical Exam: Says just feels "rotten" like when you have the flu" VS as noted UOP down past 24 hours Mild generalized anasarca Lungs clear S1S2 No S3 or rub Abd some BS Dressing in place (photo from Dr. Richarda Blade note reviewed) Pitting edema LE's New (5/20) temp cath R IJ dry dressing.  Recent Labs  Lab 07/23/17 0645 07/24/17 0601 07/25/17 0335  NA 134* 131* 131*  K 3.7 3.5 3.8  CL 99* 98* 97*  CO2 17* 15* 12*  GLUCOSE 101* 97 93  BUN 72* 81* 91*  CREATININE 13.91* 15.17* 15.94*  CALCIUM 8.8* 9.1 9.2  PHOS 9.5* 11.1* 12.5*    Recent Labs  Lab 07/22/17 0802 07/23/17 0645 07/24/17 0601 07/25/17 0335  AST 19  --   --   --   ALT 20  --   --   --   ALKPHOS 85  --   --   --   BILITOT 0.9  --   --   --   PROT 5.3*  --   --   --   ALBUMIN 2.2* 2.0* 2.1* 2.1*  Recent Labs  Lab 07/22/17 0802  LIPASE 329*    Recent Labs  Lab 07/21/17 0446 07/22/17 0453 07/23/17 0645 07/24/17 0601 07/25/17 0335  WBC 17.6* 18.4* 17.2* 14.8* 12.7*  HGB 9.9* 9.9* 9.2* 9.0* 8.2*  HCT 28.7* 28.1* 25.9* 24.9* 23.4*  MCV 90.5 89.2 88.4 88.6 90.3  PLT 549* 552* 565* 580* 507*   Studies/Results: Ir Fluoro Guide Cv Line Right  Result Date: 07/25/2017 INDICATION: ATN.  Nephrotoxicity. EXAM: TUNNELED DIALYSIS CATHETER PLACEMENT, ULTRASOUND GUIDANCE FOR VASCULAR ACCESS MEDICATIONS: None ANESTHESIA/SEDATION: None FLUOROSCOPY TIME:  Fluoroscopy Time:  minutes 30 seconds (2 mGy). COMPLICATIONS: None immediate. PROCEDURE: The right neck was prepped and draped in a sterile fashion. 1% lidocaine was utilized for local anesthesia. Under sonographic guidance, a micropuncture needle was inserted into the right jugular vein and removed over a 018 wire which was up sized to an Amplatz. Twelve [place] dilator followed by the  12 [place] dialysis catheter were inserted. It was flushed and sewn in place. FINDINGS: Images document a temporary right jugular dialysis catheter with its tip at the cavoatrial junction. IMPRESSION: Successful right IJ vein temporary dialysis catheter with its tip at the cavoatrial junction. Electronically Signed   By: Marybelle Killings M.D.   On: 07/25/2017 09:53   Ir US Guide Vasc Access Right  Result Date: 07/25/2017 INDICATION: ATN.  Nephrotoxicity. EXAM: TUNNELED DIALYSIS CATHETER PLACEMENT, ULTRASOUND GUIDANCE FOR VASCULAR ACCESS MEDICATIONS: None ANESTHESIA/SEDATION: None FLUOROSCOPY TIME:  Fluoroscopy Time:  minutes 30 seconds (2 mGy). COMPLICATIONS: None immediate. PROCEDURE: The right neck was prepped and draped in a sterile fashion. 1% lidocaine was utilized for local anesthesia. Under sonographic guidance, a micropuncture needle was inserted into the right jugular vein and removed over a 018 wire which was up sized to an Amplatz. Twelve [place] dilator followed by the 12 [place] dialysis catheter were inserted. It was flushed and sewn in place. FINDINGS: Images document a temporary right jugular dialysis catheter with its tip at the cavoatrial junction. IMPRESSION: Successful right IJ vein temporary dialysis catheter with its tip at the cavoatrial junction. Electronically Signed   By: Marybelle Killings M.D.   On: 07/25/2017 09:53   Medications: Infusions: . famotidine (PEPCID) IV Stopped (07/25/17 1029)  . piperacillin-tazobactam (ZOSYN)  IV Stopped (07/25/17 1029)    Scheduled Medications: . darbepoetin (ARANESP) injection - DIALYSIS  100 mcg Intravenous Q Mon-HD  . enoxaparin (LOVENOX) injection  30 mg Subcutaneous Q24H  . feeding supplement  1 Container Oral BID BM  . lidocaine      . methocarbamol  500 mg Oral TID      Jamal Maes, MD [facility] [phone] Pager 07/25/2017, 1:06 PM

## 2017-07-25 NOTE — Progress Notes (Addendum)
[facility] Progress Note  10 Days Post-Op  Subjective: CC: black tarry stool yesterday Patient reports he had a black tarry stool yesterday, had another BM this AM that was large and dark but more green in color. Does have a history of GI bleeding. No BRBPR. Patient denies nausea or vomiting, reports decreased appetite overall. Abdominal pain well controlled. Patient also noted a blister medial to wound at the umbilicus last night with dressing change, not painful but does itch some. No other similar lesions on his body.   Creatinine >15 today, UOP 525cc/24hr.  Of note, patient states that he had 2 cousins on his dad's side that required renal transplants for unknown reason.   Objective: Vital signs in last 24 hours: Temp:  [98.1 F (36.7 C)-99 F (37.2 C)] 99 F (37.2 C) (05/20 0546) Pulse Rate:  [66-77] 77 (05/20 0546) Resp:  [16-18] 16 (05/20 0546) BP: (136-144)/(92-96) 139/92 (05/20 0546) SpO2:  [99 %-100 %] 99 % (05/20 0546) Last BM Date: 07/24/17  Intake/Output from previous day: 05/19 0701 - 05/20 0700 In: 480 [P.O.:480] Out: 555 [Urine:525; Drains:30] Intake/Output this shift: No intake/output data recorded.  PE: Gen:  Alert, NAD, pleasant Card:  Regular rate and rhythm, pedal pulses 2+ BL, BL upper extremities mildly edematous Pulm:  Normal effort, clear to auscultation bilaterally Abd: Soft, non-tender, non-distended, bowel sounds present, no HSM, midline wound as below - blister noted at umbilicus; JP with SS drainage  Skin: warm and dry, no rashes  Psych: A&Ox3   Lab Results:  Recent Labs    07/24/17 0601 07/25/17 0335  WBC 14.8* 12.7*  HGB 9.0* 8.2*  HCT 24.9* 23.4*  PLT 580* 507*   BMET Recent Labs    07/24/17 0601 07/25/17 0335  NA 131* 131*  K 3.5 3.8  CL 98* 97*  CO2 15* 12*  GLUCOSE 97 93  BUN 81* 91*  CREATININE 15.17* 15.94*  CALCIUM 9.1 9.2   PT/INR No results for input(s): LABPROT, INR in the last 72 hours. CMP     Component Value Date/Time   NA 131 (L) 07/25/2017 0335   K 3.8 07/25/2017 0335   CL 97 (L) 07/25/2017 0335   CO2 12 (L) 07/25/2017 0335   GLUCOSE 93 07/25/2017 0335   BUN 91 (H) 07/25/2017 0335   CREATININE 15.94 (H) 07/25/2017 0335   CALCIUM 9.2 07/25/2017 0335   PROT 5.3 (L) 07/22/2017 0802   ALBUMIN 2.1 (L) 07/25/2017 0335   AST 19 07/22/2017 0802   ALT 20 07/22/2017 0802   ALKPHOS 85 07/22/2017 0802   BILITOT 0.9 07/22/2017 0802   GFRNONAA 3 (L) 07/25/2017 0335   GFRAA 4 (L) 07/25/2017 0335   Lipase     Component Value Date/Time   LIPASE 329 (H) 07/22/2017 0802       Studies/Results: No results found.  Anti-infectives: Anti-infectives (From admission, onward)   Start     Dose/Rate Route Frequency Ordered Stop   07/16/17 1600  piperacillin-tazobactam (ZOSYN) IVPB 2.25 g     2.25 g 100 mL/hr over 30 Minutes Intravenous Every 8 hours 07/16/17 1213     07/15/17 1445  vancomycin (VANCOCIN) IVPB 750 mg/150 ml premix  Status:  Discontinued     750 mg 150 mL/hr over 60 Minutes Intravenous To Surgery 07/15/17 1437 07/15/17 2041   07/14/17 2300  piperacillin-tazobactam (ZOSYN) IVPB 3.375 g  Status:  Discontinued     3.375 g 12.5 mL/hr over 240 Minutes Intravenous Every 8 hours 07/13/17 1930  07/14/17 0858   07/14/17 0900  piperacillin-tazobactam (ZOSYN) IVPB 3.375 g  Status:  Discontinued     3.375 g 12.5 mL/hr over 240 Minutes Intravenous Every 8 hours 07/14/17 0858 07/16/17 1213   07/14/17 0445  vancomycin (VANCOCIN) IVPB 750 mg/150 ml premix  Status:  Discontinued     750 mg 150 mL/hr over 60 Minutes Intravenous Every 8 hours 07/14/17 0431 07/16/17 0856   07/13/17 1900  piperacillin-tazobactam (ZOSYN) IVPB 3.375 g  Status:  Discontinued     3.375 g 12.5 mL/hr over 240 Minutes Intravenous Every 8 hours 07/13/17 1847 07/13/17 1930   07/13/17 1530  piperacillin-tazobactam (ZOSYN) IVPB 3.375 g     3.375 g 100 mL/hr over 30 Minutes Intravenous  Once 07/13/17 1515  07/13/17 1636       Assessment/Plan Prior h/o GI bleed with no known cause, workup unrevealing  Complexintra-abdominal abscess S/pex lapSBR and drainage of intra-abdominal abscess5/10 Dr. Redmond Pulling - POD10 - path:GASTROINTESTINAL STROMAL TUMOR (GIST), SPANNING 1.7 CM; TRANSMURAL DEFECT;INFLAMED OMENTUM;THE SURGICAL RESECTION MARGINS ARE NEGATIVE FOR TUMOR - VACd/c 5/17, now W2D dressing changes - blister noted at umbilicus today  -toleratingsoft dietand having bowel function -JPdrain with decreased output30cc serousdrainage; sent for creatinine 12 (same as serum) and amylase276 - serum lipase 329 which could represent pancreatitis, but no findings of this on CT and patient has no upper abdominal pain - CT 5/17 showedRound intermediate density collection in the deep RIGHT pelvis, abscess vs normal postop fluid;No upper abdomen complication following abscess/gastrointestinal stromal tumor resection from small bowel; no leak - WBC trending down, afebrile ABL anemia - Hgb down to 8.2, VSS, continue to monitor - pt reported black stool yesterday  ARF -appreciate nephrology's assistance -cr up to>15 -UOP525cc/24hr  ID -zosynat renal adjusted dose5/8>> VTE -SCDs, hold lovenox with dark tarry stool FEN -soft diet, Boost Foley -d/c 5/15, strict I&Os Follow up -Dr. Redmond Pulling  Plan -Continue soft diet. Creatinine still trending up, nephrology planning for dialysis today.  Continue IV zosyn with renal dosing.     LOS: 12 days    Brigid Re , [facility] 07/25/2017, 7:59 AM Pager: [phone] Consults: [phone] Mon-Fri 7:00 am-4:30 pm Sat-Sun 7:00 am-11:30 am  Spoke with patient, did not uncover wound.  Doing okay, appetite not great.  Blisterin around the wound is not a problem.  dialysis catheter placed today.  Will be dialyzed soon.  No other changes.  Kathryne Eriksson. Dahlia Bailiff, MD,  [place] [phone] [facility]

## 2017-07-25 NOTE — Progress Notes (Signed)
Pt vomited approximately 200cc of bilious material. Zofran was given earlier and not effective. Phenergan 12.5 mg IV given. Willl continue to monitor pt.

## 2017-07-26 LAB — RENAL FUNCTION PANEL
Albumin: 2.2 g/dL — ABNORMAL LOW (ref 3.5–5.0)
Anion gap: 23 — ABNORMAL HIGH (ref 5–15)
BUN: 102 mg/dL — ABNORMAL HIGH (ref 6–20)
CO2: 11 mmol/L — ABNORMAL LOW (ref 22–32)
Calcium: 9.4 mg/dL (ref 8.9–10.3)
Chloride: 99 mmol/L — ABNORMAL LOW (ref 101–111)
Creatinine, Ser: 16.95 mg/dL — ABNORMAL HIGH (ref 0.61–1.24)
GFR calc Af Amer: 4 mL/min — ABNORMAL LOW (ref >60)
GFR calc non Af Amer: 3 mL/min — ABNORMAL LOW (ref >60)
Glucose, Bld: 97 mg/dL (ref 65–99)
Phosphorus: 12.6 mg/dL — ABNORMAL HIGH (ref 2.5–4.6)
Potassium: 3.9 mmol/L (ref 3.5–5.1)
Sodium: 133 mmol/L — ABNORMAL LOW (ref 135–145)

## 2017-07-26 LAB — CBC
HCT: 22.2 % — ABNORMAL LOW (ref 39.0–52.0)
Hemoglobin: 7.9 g/dL — ABNORMAL LOW (ref 13.0–17.0)
MCH: 31.5 pg (ref 26.0–34.0)
MCHC: 35.6 g/dL (ref 30.0–36.0)
MCV: 88.4 fL (ref 78.0–100.0)
Platelets: 513 10*3/uL — ABNORMAL HIGH (ref 150–400)
RBC: 2.51 MIL/uL — ABNORMAL LOW (ref 4.22–5.81)
RDW: 13.2 % (ref 11.5–15.5)
WBC: 11.7 10*3/uL — ABNORMAL HIGH (ref 4.0–10.5)

## 2017-07-26 MED ORDER — RENA-VITE PO TABS
1.0000 | ORAL_TABLET | Freq: Every day | ORAL | Status: DC
  Administered 2017-07-26 – 2017-08-07 (×11): 1 via ORAL
  Filled 2017-07-26 (×13): qty 1

## 2017-07-26 MED ORDER — LINEZOLID 600 MG/300ML IV SOLN: 600.0000 mg | Freq: Two times a day (BID) | INTRAVENOUS | Status: DC

## 2017-07-26 MED ORDER — SODIUM CHLORIDE 0.9 % IV SOLN: 100.0000 mL | INTRAVENOUS | Status: DC | PRN

## 2017-07-26 MED ORDER — LIDOCAINE HCL (PF) 1 % IJ SOLN: 5.0000 mL | INTRAMUSCULAR | Status: DC | PRN

## 2017-07-26 MED ORDER — HEPARIN SODIUM (PORCINE) 1000 UNIT/ML DIALYSIS: 1000.0000 [IU] | INTRAMUSCULAR | Status: DC | PRN

## 2017-07-26 MED ORDER — FAMOTIDINE 20 MG PO TABS
20.0000 mg | ORAL_TABLET | Freq: Every day | ORAL | Status: DC
  Administered 2017-07-26 – 2017-07-27 (×2): 20 mg via ORAL
  Filled 2017-07-26 (×2): qty 1

## 2017-07-26 MED ORDER — PENTAFLUOROPROP-TETRAFLUOROETH EX AERO: 1.0000 "application " | INHALATION_SPRAY | CUTANEOUS | Status: DC | PRN

## 2017-07-26 MED ORDER — DARBEPOETIN ALFA 100 MCG/0.5ML IJ SOSY
100.0000 ug | PREFILLED_SYRINGE | INTRAMUSCULAR | Status: DC
  Administered 2017-07-26 – 2017-08-02 (×2): 100 ug via INTRAVENOUS
  Filled 2017-07-26: qty 0.5

## 2017-07-26 MED ORDER — ALTEPLASE 2 MG IJ SOLR: 2.0000 mg | Freq: Once | INTRAMUSCULAR | Status: DC | PRN

## 2017-07-26 MED ORDER — SODIUM BICARBONATE 650 MG PO TABS
650.0000 mg | ORAL_TABLET | Freq: Three times a day (TID) | ORAL | Status: DC
  Administered 2017-07-26 (×2): 650 mg via ORAL
  Filled 2017-07-26 (×2): qty 1

## 2017-07-26 MED ORDER — LIDOCAINE-PRILOCAINE 2.5-2.5 % EX CREA
1.0000 "application " | TOPICAL_CREAM | CUTANEOUS | Status: DC | PRN
  Filled 2017-07-26: qty 5

## 2017-07-26 MED ORDER — FERROUS GLUCONATE 324 (38 FE) MG PO TABS
324.0000 mg | ORAL_TABLET | Freq: Every day | ORAL | Status: DC
  Administered 2017-07-26 – 2017-08-05 (×10): 324 mg via ORAL
  Filled 2017-07-26 (×11): qty 1

## 2017-07-26 MED ORDER — SODIUM CHLORIDE 0.9% FLUSH
10.0000 mL | INTRAVENOUS | Status: DC | PRN
  Administered 2017-07-29: 10 mL
  Filled 2017-07-26: qty 40

## 2017-07-26 MED ORDER — DARBEPOETIN ALFA 100 MCG/0.5ML IJ SOSY
PREFILLED_SYRINGE | INTRAMUSCULAR | Status: AC
  Filled 2017-07-26: qty 0.5

## 2017-07-26 NOTE — Progress Notes (Signed)
[facility] Rounding Note Background: 37 y.o. 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 Baseline creatinine 1.24 . Rec'd V/Z on adm, had IV contrast X2 on 5/9. Developed AKI. Temp cath placed 5/20 for initiation of HD for creatinine of 15, uremic sx  Assessment/Plan:  1. AKI -  in the setting of abdominal surgery/IV contrast/vanc. Bland urine. U/S good sized kidneys- echogenic - creatinine pre insult was 1.24.  Still making some urine (400 past 24 hours) but creatinine continues to rise, some uremic sx. Vas cath placed in IR  5/20. HD was delayed to today 2/2 high pt census and emergencies so for this AM. Still hopeful for eventual recovery. I think HD today and tomorrow, see how he feels. Prn after that pending recovery.  2. S/p xlap for perf bowel/intraabd abscess - path + for GIST clear margins 3. Anemia- situational - added Aranesp to start with HD 5/20, modify order to start today. ABLA with dark tarry stools. Hb down to 8.2. Transfuse as needed. No heparin with HD 4. Metabolic acidosis- should improve with HD but with bicarb down to 11 and only able to do short initial TMT's will add some po Na bicarb in the interim 5. Hyperphosphatemia  - no action yet - do not want to introduce binder into tenuous GI situation   Subjective:   Creatinine continues to rise Got temp cath IR 5/20 For HD today (delayed from 5.20 d/t high pt census and emergencies) Some bilious vomiting earlier  2 cousins on his Dad's side had ESRD at young age unknown etiology (attempted bx of both small shrunken kidneys and both transplanted)   Objective Vital signs in last 24 hours: Vitals:   07/25/17 0546 07/25/17 1414 07/25/17 2146 07/26/17 0537  BP: (!) 139/92 (!) 138/94 (!) 145/91 (!) 150/98  Pulse: 77 75 78 81  Resp: 16  18 16   Temp: 99 F (37.2 C) 98.9 F (37.2 C) 99.5 F (37.5 C) 98.3 F (36.8 C)  TempSrc: Oral Oral  Oral Oral  SpO2: 99% 99% 98% 97%  Weight:      Height:       Weight change:   Intake/Output Summary (Last 24 hours) at 07/26/2017 0804 Last data filed at 07/25/2017 2318 Gross per 24 hour  Intake 620 ml  Output 610 ml  Net 10 ml   Physical Exam: VS as noted UOP down further past 24 hours Mild generalized anasarca Lungs clear S1S2 No S3 or rub Abd some BS Dressing in place (photo from Dr. Richarda Blade note reviewed) Pitting edema LE's New (5/20) temp cath R IJ dry dressing.  Recent Labs  Lab 07/24/17 0601 07/25/17 0335 07/26/17 0453  NA 131* 131* 133*  K 3.5 3.8 3.9  CL 98* 97* 99*  CO2 15* 12* 11*  GLUCOSE 97 93 97  BUN 81* 91* 102*  CREATININE 15.17* 15.94* 16.95*  CALCIUM 9.1 9.2 9.4  PHOS 11.1* 12.5* 12.6*    Recent Labs  Lab 07/22/17 0802  07/24/17 0601 07/25/17 0335 07/26/17 0453  AST 19  --   --   --   --   ALT 20  --   --   --   --   ALKPHOS 85  --   --   --   --   BILITOT 0.9  --   --   --   --   PROT 5.3*  --   --   --   --  ALBUMIN 2.2*   < > 2.1* 2.1* 2.2*   < > = values in this interval not displayed.   Recent Labs  Lab 07/22/17 0802  LIPASE 329*    Recent Labs  Lab 07/22/17 0453 07/23/17 0645 07/24/17 0601 07/25/17 0335 07/26/17 0453  WBC 18.4* 17.2* 14.8* 12.7* 11.7*  HGB 9.9* 9.2* 9.0* 8.2* 7.9*  HCT 28.1* 25.9* 24.9* 23.4* 22.2*  MCV 89.2 88.4 88.6 90.3 88.4  PLT 552* 565* 580* 507* 513*   Studies/Results: Ir Fluoro Guide Cv Line Right  Result Date: 07/25/2017 INDICATION: ATN.  Nephrotoxicity. EXAM: TUNNELED DIALYSIS CATHETER PLACEMENT, ULTRASOUND GUIDANCE FOR VASCULAR ACCESS MEDICATIONS: None ANESTHESIA/SEDATION: None FLUOROSCOPY TIME:  Fluoroscopy Time:  minutes 30 seconds (2 mGy). COMPLICATIONS: None immediate. PROCEDURE: The right neck was prepped and draped in a sterile fashion. 1% lidocaine was utilized for local anesthesia. Under sonographic guidance, a micropuncture needle was inserted into the right jugular vein and  removed over a 018 wire which was up sized to an Amplatz. Twelve [place] dilator followed by the 12 [place] dialysis catheter were inserted. It was flushed and sewn in place. FINDINGS: Images document a temporary right jugular dialysis catheter with its tip at the cavoatrial junction. IMPRESSION: Successful right IJ vein temporary dialysis catheter with its tip at the cavoatrial junction. Electronically Signed   By: Marybelle Killings M.D.   On: 07/25/2017 09:53   Ir US Guide Vasc Access Right  Result Date: 07/25/2017 INDICATION: ATN.  Nephrotoxicity. EXAM: TUNNELED DIALYSIS CATHETER PLACEMENT, ULTRASOUND GUIDANCE FOR VASCULAR ACCESS MEDICATIONS: None ANESTHESIA/SEDATION: None FLUOROSCOPY TIME:  Fluoroscopy Time:  minutes 30 seconds (2 mGy). COMPLICATIONS: None immediate. PROCEDURE: The right neck was prepped and draped in a sterile fashion. 1% lidocaine was utilized for local anesthesia. Under sonographic guidance, a micropuncture needle was inserted into the right jugular vein and removed over a 018 wire which was up sized to an Amplatz. Twelve [place] dilator followed by the 12 [place] dialysis catheter were inserted. It was flushed and sewn in place. FINDINGS: Images document a temporary right jugular dialysis catheter with its tip at the cavoatrial junction. IMPRESSION: Successful right IJ vein temporary dialysis catheter with its tip at the cavoatrial junction. Electronically Signed   By: Marybelle Killings M.D.   On: 07/25/2017 09:53   Medications: Infusions: . sodium chloride    . sodium chloride    . famotidine (PEPCID) IV Stopped (07/25/17 1029)  . piperacillin-tazobactam (ZOSYN)  IV Stopped (07/26/17 0007)    Scheduled Medications: . darbepoetin (ARANESP) injection - DIALYSIS  100 mcg Intravenous Q Mon-HD  . enoxaparin (LOVENOX) injection  30 mg Subcutaneous Q24H  . feeding supplement  1 Container Oral BID BM  . methocarbamol  500 mg Oral TID      Jamal Maes, MD [facility] [phone] Pager 07/26/2017, 8:04 AM

## 2017-07-26 NOTE — Procedures (Signed)
I have personally attended this patient's dialysis session.   HD#1 for AKI 1-1.5 liter goal 3K bath for K 3.7 Temp cath R IJ placed 5/20 (IR)  Jamal Maes, MD [facility] [phone] Pager 07/26/2017, 8:17 AM

## 2017-07-26 NOTE — Progress Notes (Signed)
TX initiated at 42

## 2017-07-26 NOTE — Progress Notes (Signed)
Spoke with Suzanne,RN to obtain an order from Nephrology to use pigtail for infusions and lab draws. This is a request from the patient, he does not want to keep getting IV sticks if we are able to use his pigtail.  Lerry Liner, RN, [facility]

## 2017-07-26 NOTE — Progress Notes (Signed)
[facility] Progress Note  11 Days Post-Op  Subjective: CC: black tarry stools Seen in HD. Patient has had 2 more tarry stools since yesterday. Had an episode of bilious emesis overnight, thinks it was related to trying to get boost down. Appetite still decreased. Blister still present at umbilicus. Past GI stuff at [facility].   Creatinine 16.95 today, UOP 400 cc/24hr.  Of note, patient reports that he had 2 cousins on his dad's side that required renal transplants for unknown reason.   Objective: Vital signs in last 24 hours: Temp:  [98 F (36.7 C)-99.5 F (37.5 C)] 98 F (36.7 C) (05/21 0828) Pulse Rate:  [72-81] 73 (05/21 0930) Resp:  [6-25] 6 (05/21 0930) BP: (138-155)/(91-101) 151/94 (05/21 0930) SpO2:  [97 %-100 %] 100 % (05/21 0930) Weight:  [82.6 kg (182 lb 1.6 oz)] 82.6 kg (182 lb 1.6 oz) (05/21 0828) Last BM Date: 07/25/17  Intake/Output from previous day: 05/20 0701 - 05/21 0700 In: 1250 [P.O.:320; IV Piggyback:930] Out: 610 [Urine:400; Emesis/NG output:200; Drains:10] Intake/Output this shift: No intake/output data recorded.  PE: Gen:  Alert, NAD, pleasant Card:  Regular rate and rhythm, pedal pulses 2+ BL, BL upper extremities mildly edematous Pulm:  Normal effort, clear to auscultation bilaterally Abd: Soft, non-tender, non-distended, bowel sounds present, no HSM, midline wound clean with good granulation tissue- blister noted at umbilicus; JP with serous drainage Skin: warm and dry, no rashes  Psych: A&Ox3   Lab Results:  Recent Labs    07/25/17 0335 07/26/17 0453  WBC 12.7* 11.7*  HGB 8.2* 7.9*  HCT 23.4* 22.2*  PLT 507* 513*   BMET Recent Labs    07/25/17 0335 07/26/17 0453  NA 131* 133*  K 3.8 3.9  CL 97* 99*  CO2 12* 11*  GLUCOSE 93 97  BUN 91* 102*  CREATININE 15.94* 16.95*  CALCIUM 9.2 9.4   PT/INR No results for input(s): LABPROT, INR in the last 72 hours. CMP     Component Value Date/Time   NA 133 (L) 07/26/2017 0453    K 3.9 07/26/2017 0453   CL 99 (L) 07/26/2017 0453   CO2 11 (L) 07/26/2017 0453   GLUCOSE 97 07/26/2017 0453   BUN 102 (H) 07/26/2017 0453   CREATININE 16.95 (H) 07/26/2017 0453   CALCIUM 9.4 07/26/2017 0453   PROT 5.3 (L) 07/22/2017 0802   ALBUMIN 2.2 (L) 07/26/2017 0453   AST 19 07/22/2017 0802   ALT 20 07/22/2017 0802   ALKPHOS 85 07/22/2017 0802   BILITOT 0.9 07/22/2017 0802   GFRNONAA 3 (L) 07/26/2017 0453   GFRAA 4 (L) 07/26/2017 0453   Lipase     Component Value Date/Time   LIPASE 329 (H) 07/22/2017 0802       Studies/Results: Ir Fluoro Guide Cv Line Right  Result Date: 07/25/2017 INDICATION: ATN.  Nephrotoxicity. EXAM: TUNNELED DIALYSIS CATHETER PLACEMENT, ULTRASOUND GUIDANCE FOR VASCULAR ACCESS MEDICATIONS: None ANESTHESIA/SEDATION: None FLUOROSCOPY TIME:  Fluoroscopy Time:  minutes 30 seconds (2 mGy). COMPLICATIONS: None immediate. PROCEDURE: The right neck was prepped and draped in a sterile fashion. 1% lidocaine was utilized for local anesthesia. Under sonographic guidance, a micropuncture needle was inserted into the right jugular vein and removed over a 018 wire which was up sized to an Amplatz. Twelve [place] dilator followed by the 12 [place] dialysis catheter were inserted. It was flushed and sewn in place. FINDINGS: Images document a temporary right jugular dialysis catheter with its tip at the cavoatrial junction. IMPRESSION: Successful right IJ  vein temporary dialysis catheter with its tip at the cavoatrial junction. Electronically Signed   By: Marybelle Killings M.D.   On: 07/25/2017 09:53   Ir US Guide Vasc Access Right  Result Date: 07/25/2017 INDICATION: ATN.  Nephrotoxicity. EXAM: TUNNELED DIALYSIS CATHETER PLACEMENT, ULTRASOUND GUIDANCE FOR VASCULAR ACCESS MEDICATIONS: None ANESTHESIA/SEDATION: None FLUOROSCOPY TIME:  Fluoroscopy Time:  minutes 30 seconds (2 mGy). COMPLICATIONS: None immediate. PROCEDURE: The right neck was prepped and draped in a sterile fashion.  1% lidocaine was utilized for local anesthesia. Under sonographic guidance, a micropuncture needle was inserted into the right jugular vein and removed over a 018 wire which was up sized to an Amplatz. Twelve [place] dilator followed by the 12 [place] dialysis catheter were inserted. It was flushed and sewn in place. FINDINGS: Images document a temporary right jugular dialysis catheter with its tip at the cavoatrial junction. IMPRESSION: Successful right IJ vein temporary dialysis catheter with its tip at the cavoatrial junction. Electronically Signed   By: Marybelle Killings M.D.   On: 07/25/2017 09:53    Anti-infectives: Anti-infectives (From admission, onward)   Start     Dose/Rate Route Frequency Ordered Stop   07/26/17 1000  linezolid (ZYVOX) IVPB 600 mg  Status:  Discontinued     600 mg 300 mL/hr over 60 Minutes Intravenous Every 12 hours 07/26/17 0820 07/26/17 0820   07/16/17 1600  piperacillin-tazobactam (ZOSYN) IVPB 2.25 g     2.25 g 100 mL/hr over 30 Minutes Intravenous Every 8 hours 07/16/17 1213     07/15/17 1445  vancomycin (VANCOCIN) IVPB 750 mg/150 ml premix  Status:  Discontinued     750 mg 150 mL/hr over 60 Minutes Intravenous To Surgery 07/15/17 1437 07/15/17 2041   07/14/17 2300  piperacillin-tazobactam (ZOSYN) IVPB 3.375 g  Status:  Discontinued     3.375 g 12.5 mL/hr over 240 Minutes Intravenous Every 8 hours 07/13/17 1930 07/14/17 0858   07/14/17 0900  piperacillin-tazobactam (ZOSYN) IVPB 3.375 g  Status:  Discontinued     3.375 g 12.5 mL/hr over 240 Minutes Intravenous Every 8 hours 07/14/17 0858 07/16/17 1213   07/14/17 0445  vancomycin (VANCOCIN) IVPB 750 mg/150 ml premix  Status:  Discontinued     750 mg 150 mL/hr over 60 Minutes Intravenous Every 8 hours 07/14/17 0431 07/16/17 0856   07/13/17 1900  piperacillin-tazobactam (ZOSYN) IVPB 3.375 g  Status:  Discontinued     3.375 g 12.5 mL/hr over 240 Minutes Intravenous Every 8 hours 07/13/17 1847 07/13/17 1930   07/13/17  1530  piperacillin-tazobactam (ZOSYN) IVPB 3.375 g     3.375 g 100 mL/hr over 30 Minutes Intravenous  Once 07/13/17 1515 07/13/17 1636       Assessment/Plan Prior h/o GI bleed with no known cause, workup unrevealing  Complexintra-abdominal abscess S/pex lapSBR and drainage of intra-abdominal abscess5/10 Dr. 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; TRANSMURAL DEFECT;INFLAMED OMENTUM;THE SURGICAL RESECTION MARGINS ARE NEGATIVE FOR TUMOR - VACd/c 5/17, now W2D dressing changes - blister noted at umbilicus today  -toleratingsoft dietand having bowel function -JPdrain withdecreased output10cc serousdrainage; sent for creatinine 12 (same as serum) and amylase276 - serum lipase 329 which could represent pancreatitis, but no findings of this on CT and patient has no upper abdominal pain - CT 5/17 showedRound intermediate density collection in the deep RIGHT pelvis, abscess vs normal postop fluid;No upper abdomen complication following abscess/gastrointestinal stromal tumor resection from small bowel; no leak - WBC trending down, afebrile ABL anemia - Hgb  down to 7.9, VSS, continue to monitor - pt still having black stools - may need to consider GI consult, will discuss with MD  ARF -appreciate nephrology's assistance -cr up to>16 -UOP400cc/24hr, steadily decreasing  ID -zosynat renal adjusted dose5/8>> VTE -SCDs, hold lovenox with dark tarry stool FEN -soft diet, Boost Foley -d/c 5/15, strict I&Os Follow up -Dr. Redmond Pulling  Plan -Continue soft diet. Creatinine still trending up, HD today.  Continue IV zosyn with renal dosing.     LOS: 13 days    Brigid Re , [facility] Surgery 07/26/2017, 9:48 AM Pager: [phone] Consults: [phone] Mon-Fri 7:00 am-4:30 pm Sat-Sun 7:00 am-11:30 am

## 2017-07-26 NOTE — Progress Notes (Signed)
Dr. Joelyn Oms rescheduled HD tx from 5/20 to 5/21, orders modified in Epic, floor notified

## 2017-07-27 LAB — PREPARE RBC (CROSSMATCH)

## 2017-07-27 LAB — CBC
HCT: 20.6 % — ABNORMAL LOW (ref 39.0–52.0)
Hemoglobin: 7.4 g/dL — ABNORMAL LOW (ref 13.0–17.0)
MCH: 31.1 pg (ref 26.0–34.0)
MCHC: 35.9 g/dL (ref 30.0–36.0)
MCV: 86.6 fL (ref 78.0–100.0)
Platelets: 452 10*3/uL — ABNORMAL HIGH (ref 150–400)
RBC: 2.38 MIL/uL — ABNORMAL LOW (ref 4.22–5.81)
RDW: 13.2 % (ref 11.5–15.5)
WBC: 10.5 10*3/uL (ref 4.0–10.5)

## 2017-07-27 LAB — RENAL FUNCTION PANEL
Albumin: 2.1 g/dL — ABNORMAL LOW (ref 3.5–5.0)
Anion gap: 14 (ref 5–15)
BUN: 72 mg/dL — ABNORMAL HIGH (ref 6–20)
CO2: 21 mmol/L — ABNORMAL LOW (ref 22–32)
Calcium: 8.4 mg/dL — ABNORMAL LOW (ref 8.9–10.3)
Chloride: 99 mmol/L — ABNORMAL LOW (ref 101–111)
Creatinine, Ser: 12.31 mg/dL — ABNORMAL HIGH (ref 0.61–1.24)
GFR calc Af Amer: 5 mL/min — ABNORMAL LOW (ref >60)
GFR calc non Af Amer: 5 mL/min — ABNORMAL LOW (ref >60)
Glucose, Bld: 102 mg/dL — ABNORMAL HIGH (ref 65–99)
Phosphorus: 8.4 mg/dL — ABNORMAL HIGH (ref 2.5–4.6)
Potassium: 3.4 mmol/L — ABNORMAL LOW (ref 3.5–5.1)
Sodium: 134 mmol/L — ABNORMAL LOW (ref 135–145)

## 2017-07-27 LAB — HEPATITIS B CORE ANTIBODY, TOTAL: Hep B Core Total Ab: NEGATIVE

## 2017-07-27 LAB — HEPATITIS B SURFACE ANTIGEN: Hepatitis B Surface Ag: NEGATIVE

## 2017-07-27 LAB — HEPATITIS B SURFACE ANTIBODY,QUALITATIVE: Hep B S Ab: REACTIVE

## 2017-07-27 MED ORDER — SODIUM CHLORIDE 0.9 % IV SOLN: Freq: Once | INTRAVENOUS | Status: DC

## 2017-07-27 MED ORDER — PANTOPRAZOLE SODIUM 40 MG PO TBEC
40.0000 mg | DELAYED_RELEASE_TABLET | Freq: Every day | ORAL | Status: DC
  Administered 2017-07-28 – 2017-08-08 (×12): 40 mg via ORAL
  Filled 2017-07-27 (×12): qty 1

## 2017-07-27 MED ORDER — DIPHENHYDRAMINE HCL 25 MG PO CAPS
25.0000 mg | ORAL_CAPSULE | Freq: Four times a day (QID) | ORAL | Status: DC | PRN
  Administered 2017-07-27 – 2017-08-02 (×5): 25 mg via ORAL
  Filled 2017-07-27 (×5): qty 1

## 2017-07-27 NOTE — Progress Notes (Signed)
[facility] Progress Note  12 Days Post-Op  Subjective: CC: fever Patient with low grade fever overnight, Tmax 100.6. Not tachycardic WBC decreased. No increased pain, nausea or vomiting. Patient feels like appetite is actually coming back some. Had a coffee-ground appearing stool. Patient asking if he could go outside today.   Creatinine12 today Of note, patient reports that he had 2 cousins on his dad's side that required renal transplants for unknown reason.  Objective: Vital signs in last 24 hours: Temp:  [97.6 F (36.4 C)-100.6 F (38.1 C)] 100.6 F (38.1 C) (05/22 0519) Pulse Rate:  [70-83] 79 (05/21 2042) Resp:  [6-25] 18 (05/21 2042) BP: (136-155)/(82-101) 150/98 (05/22 0519) SpO2:  [97 %-100 %] 97 % (05/22 0519) Weight:  [82.6 kg (182 lb 1.6 oz)] 82.6 kg (182 lb 1.6 oz) (05/21 0828) Last BM Date: 07/26/17  Intake/Output from previous day: 05/21 0701 - 05/22 0700 In: 24 [P.O.:540; IV Piggyback:50] Out: 1510 [Drains:10] Intake/Output this shift: Total I/O In: 50 [IV Piggyback:50] Out: -   PE: Gen: Alert, NAD, pleasant Card: Regular rate and rhythm, pedal pulses 2+ BL, BL upper extremities mildly edematous Pulm: Normal effort, clear to auscultation bilaterally Abd: Soft, non-tender, non-distended, bowel sounds present, no HSM,midline wound clean with good granulation tissue- blister noted at umbilicus; JP with milky - serous drainage Skin: warm and dry, no rashes  Psych: A&Ox3     Lab Results:  Recent Labs    07/26/17 0453 07/27/17 0407  WBC 11.7* 10.5  HGB 7.9* 7.4*  HCT 22.2* 20.6*  PLT 513* 452*   BMET Recent Labs    07/26/17 0453 07/27/17 0407  NA 133* 134*  K 3.9 3.4*  CL 99* 99*  CO2 11* 21*  GLUCOSE 97 102*  BUN 102* 72*  CREATININE 16.95* 12.31*  CALCIUM 9.4 8.4*   PT/INR No results for input(s): LABPROT, INR in the last 72 hours. CMP     Component Value Date/Time   NA 134 (L) 07/27/2017 0407   K 3.4 (L)  07/27/2017 0407   CL 99 (L) 07/27/2017 0407   CO2 21 (L) 07/27/2017 0407   GLUCOSE 102 (H) 07/27/2017 0407   BUN 72 (H) 07/27/2017 0407   CREATININE 12.31 (H) 07/27/2017 0407   CALCIUM 8.4 (L) 07/27/2017 0407   PROT 5.3 (L) 07/22/2017 0802   ALBUMIN 2.1 (L) 07/27/2017 0407   AST 19 07/22/2017 0802   ALT 20 07/22/2017 0802   ALKPHOS 85 07/22/2017 0802   BILITOT 0.9 07/22/2017 0802   GFRNONAA 5 (L) 07/27/2017 0407   GFRAA 5 (L) 07/27/2017 0407   Lipase     Component Value Date/Time   LIPASE 329 (H) 07/22/2017 0802       Studies/Results: Ir Fluoro Guide Cv Line Right  Result Date: 07/25/2017 INDICATION: ATN.  Nephrotoxicity. EXAM: TUNNELED DIALYSIS CATHETER PLACEMENT, ULTRASOUND GUIDANCE FOR VASCULAR ACCESS MEDICATIONS: None ANESTHESIA/SEDATION: None FLUOROSCOPY TIME:  Fluoroscopy Time:  minutes 30 seconds (2 mGy). COMPLICATIONS: None immediate. PROCEDURE: The right neck was prepped and draped in a sterile fashion. 1% lidocaine was utilized for local anesthesia. Under sonographic guidance, a micropuncture needle was inserted into the right jugular vein and removed over a 018 wire which was up sized to an Amplatz. Twelve [place] dilator followed by the 12 [place] dialysis catheter were inserted. It was flushed and sewn in place. FINDINGS: Images document a temporary right jugular dialysis catheter with its tip at the cavoatrial junction. IMPRESSION: Successful right IJ vein temporary dialysis catheter with  its tip at the cavoatrial junction. Electronically Signed   By: Marybelle Killings M.D.   On: 07/25/2017 09:53   Ir US Guide Vasc Access Right  Result Date: 07/25/2017 INDICATION: ATN.  Nephrotoxicity. EXAM: TUNNELED DIALYSIS CATHETER PLACEMENT, ULTRASOUND GUIDANCE FOR VASCULAR ACCESS MEDICATIONS: None ANESTHESIA/SEDATION: None FLUOROSCOPY TIME:  Fluoroscopy Time:  minutes 30 seconds (2 mGy). COMPLICATIONS: None immediate. PROCEDURE: The right neck was prepped and draped in a sterile fashion.  1% lidocaine was utilized for local anesthesia. Under sonographic guidance, a micropuncture needle was inserted into the right jugular vein and removed over a 018 wire which was up sized to an Amplatz. Twelve [place] dilator followed by the 12 [place] dialysis catheter were inserted. It was flushed and sewn in place. FINDINGS: Images document a temporary right jugular dialysis catheter with its tip at the cavoatrial junction. IMPRESSION: Successful right IJ vein temporary dialysis catheter with its tip at the cavoatrial junction. Electronically Signed   By: Marybelle Killings M.D.   On: 07/25/2017 09:53    Anti-infectives: Anti-infectives (From admission, onward)   Start     Dose/Rate Route Frequency Ordered Stop   07/26/17 1000  linezolid (ZYVOX) IVPB 600 mg  Status:  Discontinued     600 mg 300 mL/hr over 60 Minutes Intravenous Every 12 hours 07/26/17 0820 07/26/17 0820   07/16/17 1600  piperacillin-tazobactam (ZOSYN) IVPB 2.25 g     2.25 g 100 mL/hr over 30 Minutes Intravenous Every 8 hours 07/16/17 1213     07/15/17 1445  vancomycin (VANCOCIN) IVPB 750 mg/150 ml premix  Status:  Discontinued     750 mg 150 mL/hr over 60 Minutes Intravenous To Surgery 07/15/17 1437 07/15/17 2041   07/14/17 2300  piperacillin-tazobactam (ZOSYN) IVPB 3.375 g  Status:  Discontinued     3.375 g 12.5 mL/hr over 240 Minutes Intravenous Every 8 hours 07/13/17 1930 07/14/17 0858   07/14/17 0900  piperacillin-tazobactam (ZOSYN) IVPB 3.375 g  Status:  Discontinued     3.375 g 12.5 mL/hr over 240 Minutes Intravenous Every 8 hours 07/14/17 0858 07/16/17 1213   07/14/17 0445  vancomycin (VANCOCIN) IVPB 750 mg/150 ml premix  Status:  Discontinued     750 mg 150 mL/hr over 60 Minutes Intravenous Every 8 hours 07/14/17 0431 07/16/17 0856   07/13/17 1900  piperacillin-tazobactam (ZOSYN) IVPB 3.375 g  Status:  Discontinued     3.375 g 12.5 mL/hr over 240 Minutes Intravenous Every 8 hours 07/13/17 1847 07/13/17 1930   07/13/17  1530  piperacillin-tazobactam (ZOSYN) IVPB 3.375 g     3.375 g 100 mL/hr over 30 Minutes Intravenous  Once 07/13/17 1515 07/13/17 1636       Assessment/Plan Prior h/o GI bleed with no known cause, workup unrevealing  Complexintra-abdominal abscess S/pex lapSBR and drainage of intra-abdominal abscess5/10 Dr. 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; TRANSMURAL DEFECT;INFLAMED OMENTUM;THE SURGICAL RESECTION MARGINS ARE NEGATIVE FOR TUMOR - VACd/c 5/17, now W2D dressing changes -toleratingsoft dietand having bowel function -JPdrain withdecreased output10cc, slightly more milky in appearance today - serum lipase 329 which could represent pancreatitis, but no findings of this on CT and patient has no upper abdominal pain - CT 5/17 showedRound intermediate density collection in the deep RIGHT pelvis, abscess vs normal postop fluid;No upper abdomen complication following abscess/gastrointestinal stromal tumor resection from small bowel; no leak - WBC trending down, Tmax 100.6 ABL anemia- Hgb down to 7.4, VSS, transfuse 2 units in dialysis - pt still having black stools -  potentially due to build up of lovenox with renal failure? lovenox stopped 5/20  ARF -appreciate nephrology's assistance -cr decreased to 12 after dialysis yesterday -still making some urine  ID -zosynat renal adjusted dose5/8>> VTE -SCDs, hold lovenox with dark tarry stool FEN -soft diet, Boost Foley -d/c 5/15, strict I&Os Follow up -Dr. Redmond Pulling  Plan -Continue soft diet. Creatinine down, HD today. Concerned about drain fluid being a little more milky in appearance today, may need to consider non-contrast CT. Transfuse 2 units. Patient may go outside.  Continue IV zosynwith renal dosing.    LOS: 14 days    Brigid Re , [facility] Surgery 07/27/2017, 8:13 AM Pager: [phone] Consults: [phone] Mon-Fri 7:00 am-4:30  pm Sat-Sun 7:00 am-11:30 am

## 2017-07-27 NOTE — Progress Notes (Signed)
Report given to Dialysis nurse.   

## 2017-07-27 NOTE — Procedures (Signed)
I have personally attended this patient's dialysis session.   Pt to get 2U transfusion (tarry stool, Hb down 7.4) No heparin with HD Will need to extend TMT #2 by 30 minutes so lower BFR to 200 Current bath 4K for K 3.4  Jamal Maes, MD [place] Pager 07/27/2017, 9:51 AM

## 2017-07-27 NOTE — Progress Notes (Signed)
[facility] Rounding Note Background: 37 y.o. 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 Baseline creatinine 1.24 . Rec'd V/Z on adm, had IV contrast X2 on 5/9. Developed AKI. Temp cath placed 5/20 for initiation of HD for creatinine of 15, uremic sx  Assessment/Plan:  1. AKI -  in the setting of abdominal surgery/IV contrast/vanc. Bland urine. U/S good sized kidneys- echogenic - creatinine pre insult was 1.24.  Making some urine (400 past 24 hours) but no GFR. Vas cath placed by IR  5/20. HD #1 5/21, #2 today 5/22. Still hopeful for eventual recovery.  2. S/p xlap for perf bowel/intraabd abscess - path + for GIST clear margins 3. Fever - drain fluid cloudier. ? If will be reimaged? Remains on zosyn. 2.  4. ABLA with dark tarry stools. No heparin with HD. 2U PRBC's today 5/22. Aranesp 100 dosed 5/21 5. Metabolic acidosis- bicarb up to 21. Stop oral biarb. 6. Hyperphosphatemia  - no action yet - do not want to introduce binder into tenuous GI situation   Jamal Maes, MD [place] Pager 07/27/2017, 9:44 AM   Subjective:   Got temp cath IR 5/20, 1st HD 5/20, 2nd today Having to extend time for transfusion of 2U ordered by Surgery so lowering BFR to 200 since only 2nd Rx 400 UOP yesterday Fever overnight, black stools, drop in Hb and surgery has ordered 2U transfusion Lovenox was stopped 5/20 and getting no heparin with HD Fluid in JP drain looks cloudier to me  Objective Vital signs in last 24 hours: Vitals:   07/27/17 0519 07/27/17 0830 07/27/17 0840 07/27/17 0845  BP: (!) 150/98 136/83 138/83 133/81  Pulse:  88 87 84  Resp:  15 18 15   Temp: (!) 100.6 F (38.1 C) 98.4 F (36.9 C)    TempSrc: Oral Oral    SpO2: 97% 99%    Weight:  82 kg (180 lb 12.4 oz)    Height:       Weight change:   Intake/Output Summary (Last 24 hours) at 07/27/2017 0934 Last data filed at  07/27/2017 0805 Gross per 24 hour  Intake 640 ml  Output 1510 ml  Net -870 ml   Physical Exam: VS as noted Seen in HD Pre weight 82 kg Mild generalized anasarca Lungs clear S1S2 No S3 or rub Abd some BS Dressing in place (photo from Dr. Richarda Blade prior note reviewed) Drain with milky looking fluid (prev clear) Pitting edema LE's unchanged New (5/20) temp cath R IJ dry dressing in use.  Recent Labs  Lab 07/25/17 0335 07/26/17 0453 07/27/17 0407  NA 131* 133* 134*  K 3.8 3.9 3.4*  CL 97* 99* 99*  CO2 12* 11* 21*  GLUCOSE 93 97 102*  BUN 91* 102* 72*  CREATININE 15.94* 16.95* 12.31*  CALCIUM 9.2 9.4 8.4*  PHOS 12.5* 12.6* 8.4*    Recent Labs  Lab 07/22/17 0802  07/25/17 0335 07/26/17 0453 07/27/17 0407  AST 19  --   --   --   --   ALT 20  --   --   --   --   ALKPHOS 85  --   --   --   --   BILITOT 0.9  --   --   --   --   PROT 5.3*  --   --   --   --   ALBUMIN 2.2*   < >  2.1* 2.2* 2.1*   < > = values in this interval not displayed.   Recent Labs  Lab 07/22/17 0802  LIPASE 329*    Recent Labs  Lab 07/23/17 0645 07/24/17 0601 07/25/17 0335 07/26/17 0453 07/27/17 0407  WBC 17.2* 14.8* 12.7* 11.7* 10.5  HGB 9.2* 9.0* 8.2* 7.9* 7.4*  HCT 25.9* 24.9* 23.4* 22.2* 20.6*  MCV 88.4 88.6 90.3 88.4 86.6  PLT 565* 580* 507* 513* 452*   Studies/Results: Ir Fluoro Guide Cv Line Right  Result Date: 07/25/2017 INDICATION: ATN.  Nephrotoxicity. EXAM: TUNNELED DIALYSIS CATHETER PLACEMENT, ULTRASOUND GUIDANCE FOR VASCULAR ACCESS MEDICATIONS: None ANESTHESIA/SEDATION: None FLUOROSCOPY TIME:  Fluoroscopy Time:  minutes 30 seconds (2 mGy). COMPLICATIONS: None immediate. PROCEDURE: The right neck was prepped and draped in a sterile fashion. 1% lidocaine was utilized for local anesthesia. Under sonographic guidance, a micropuncture needle was inserted into the right jugular vein and removed over a 018 wire which was up sized to an Amplatz. Twelve [place] dilator followed by the  12 [place] dialysis catheter were inserted. It was flushed and sewn in place. FINDINGS: Images document a temporary right jugular dialysis catheter with its tip at the cavoatrial junction. IMPRESSION: Successful right IJ vein temporary dialysis catheter with its tip at the cavoatrial junction. Electronically Signed   By: Marybelle Killings M.D.   On: 07/25/2017 09:53   Ir US Guide Vasc Access Right  Result Date: 07/25/2017 INDICATION: ATN.  Nephrotoxicity. EXAM: TUNNELED DIALYSIS CATHETER PLACEMENT, ULTRASOUND GUIDANCE FOR VASCULAR ACCESS MEDICATIONS: None ANESTHESIA/SEDATION: None FLUOROSCOPY TIME:  Fluoroscopy Time:  minutes 30 seconds (2 mGy). COMPLICATIONS: None immediate. PROCEDURE: The right neck was prepped and draped in a sterile fashion. 1% lidocaine was utilized for local anesthesia. Under sonographic guidance, a micropuncture needle was inserted into the right jugular vein and removed over a 018 wire which was up sized to an Amplatz. Twelve [place] dilator followed by the 12 [place] dialysis catheter were inserted. It was flushed and sewn in place. FINDINGS: Images document a temporary right jugular dialysis catheter with its tip at the cavoatrial junction. IMPRESSION: Successful right IJ vein temporary dialysis catheter with its tip at the cavoatrial junction. Electronically Signed   By: Marybelle Killings M.D.   On: 07/25/2017 09:53   Medications: Infusions: . sodium chloride    . sodium chloride    . sodium chloride    . piperacillin-tazobactam (ZOSYN)  IV Stopped (07/27/17 0805)    Scheduled Medications: . darbepoetin (ARANESP) injection - DIALYSIS  100 mcg Intravenous Q Tue-HD  . famotidine  20 mg Oral Daily  . feeding supplement  1 Container Oral BID BM  . ferrous gluconate  324 mg Oral QPC supper  . methocarbamol  500 mg Oral TID  . multivitamin  1 tablet Oral QHS  . sodium bicarbonate  650 mg Oral TID

## 2017-07-27 NOTE — Progress Notes (Signed)
Pt.had slept overnight ,around 0500 am had a low grade  fever (100.44F)and Tylenol was given.

## 2017-07-28 LAB — TYPE AND SCREEN
ABO/RH(D): O POS
Antibody Screen: NEGATIVE
Unit division: 0
Unit division: 0

## 2017-07-28 LAB — RENAL FUNCTION PANEL
Albumin: 2.2 g/dL — ABNORMAL LOW (ref 3.5–5.0)
Anion gap: 12 (ref 5–15)
BUN: 53 mg/dL — ABNORMAL HIGH (ref 6–20)
CO2: 24 mmol/L (ref 22–32)
Calcium: 8 mg/dL — ABNORMAL LOW (ref 8.9–10.3)
Chloride: 100 mmol/L — ABNORMAL LOW (ref 101–111)
Creatinine, Ser: 8.72 mg/dL — ABNORMAL HIGH (ref 0.61–1.24)
GFR calc Af Amer: 8 mL/min — ABNORMAL LOW (ref >60)
GFR calc non Af Amer: 7 mL/min — ABNORMAL LOW (ref >60)
Glucose, Bld: 118 mg/dL — ABNORMAL HIGH (ref 65–99)
Phosphorus: 6.1 mg/dL — ABNORMAL HIGH (ref 2.5–4.6)
Potassium: 3.4 mmol/L — ABNORMAL LOW (ref 3.5–5.1)
Sodium: 136 mmol/L (ref 135–145)

## 2017-07-28 LAB — CBC
HCT: 23.6 % — ABNORMAL LOW (ref 39.0–52.0)
Hemoglobin: 8.4 g/dL — ABNORMAL LOW (ref 13.0–17.0)
MCH: 30.9 pg (ref 26.0–34.0)
MCHC: 35.6 g/dL (ref 30.0–36.0)
MCV: 86.8 fL (ref 78.0–100.0)
Platelets: 429 10*3/uL — ABNORMAL HIGH (ref 150–400)
RBC: 2.72 MIL/uL — ABNORMAL LOW (ref 4.22–5.81)
RDW: 13.7 % (ref 11.5–15.5)
WBC: 8.9 10*3/uL (ref 4.0–10.5)

## 2017-07-28 LAB — BPAM RBC
Blood Product Expiration Date: 201905292359
Blood Product Expiration Date: 201906102359
ISSUE DATE / TIME: 201905220959
ISSUE DATE / TIME: 201905221002
Unit Type and Rh: 5100
Unit Type and Rh: 9500

## 2017-07-28 MED ORDER — NEPRO/CARBSTEADY PO LIQD
237.0000 mL | Freq: Two times a day (BID) | ORAL | Status: DC
  Administered 2017-07-29: 237 mL via ORAL
  Filled 2017-07-28 (×18): qty 237

## 2017-07-28 NOTE — Progress Notes (Signed)
[facility] Progress Note  13 Days Post-Op  Subjective: CC: black stools Stools still dark but becoming more formed. Appetite improving. Denies abdominal pain, nausea or vomiting. Voided 550 cc this AM, and very hopeful with improvement in BUN and sCr. Going for dialysis again this afternoon.  Patient states the last time he had a GI bleed, capsule endoscopy was mentioned to him if it ever happened again.   Objective: Vital signs in last 24 hours: Temp:  [97.7 F (36.5 C)-100.2 F (37.9 C)] 99.3 F (37.4 C) (05/23 0427) Pulse Rate:  [74-95] 94 (05/23 0427) Resp:  [14-18] 18 (05/23 0427) BP: (133-154)/(80-103) 152/94 (05/23 0427) SpO2:  [95 %-100 %] 95 % (05/23 0427) Weight:  [79 kg (174 lb 2.6 oz)-82 kg (180 lb 12.4 oz)] 79 kg (174 lb 2.6 oz) (05/22 1210) Last BM Date: 07/28/17  Intake/Output from previous day: 05/22 0701 - 05/23 0700 In: 1250 [P.O.:570; Blood:630; IV Piggyback:50] Out: 3220 [Urine:825; Drains:5] Intake/Output this shift: Total I/O In: -  Out: 550 [Urine:550]  PE: Gen: Alert, NAD, pleasant Card: Regular rate and rhythm, pedal pulses 2+ BL, BL upper extremities mildly edematous Pulm: Normal effort, clear to auscultation bilaterally Abd: Soft, non-tender, non-distended, bowel sounds present, no HSM,midline woundclean with good granulation tissue-blister noted at umbilicus; JP with milky - serousdrainage Skin: warm and dry, no rashes  Psych: A&Ox3    Lab Results:  Recent Labs    07/27/17 0407 07/28/17 0307  WBC 10.5 8.9  HGB 7.4* 8.4*  HCT 20.6* 23.6*  PLT 452* 429*   BMET Recent Labs    07/27/17 0407 07/28/17 0307  NA 134* 136  K 3.4* 3.4*  CL 99* 100*  CO2 21* 24  GLUCOSE 102* 118*  BUN 72* 53*  CREATININE 12.31* 8.72*  CALCIUM 8.4* 8.0*   PT/INR No results for input(s): LABPROT, INR in the last 72 hours. CMP     Component Value Date/Time   NA 136 07/28/2017 0307   K 3.4 (L) 07/28/2017 0307   CL 100 (L)  07/28/2017 0307   CO2 24 07/28/2017 0307   GLUCOSE 118 (H) 07/28/2017 0307   BUN 53 (H) 07/28/2017 0307   CREATININE 8.72 (H) 07/28/2017 0307   CALCIUM 8.0 (L) 07/28/2017 0307   PROT 5.3 (L) 07/22/2017 0802   ALBUMIN 2.2 (L) 07/28/2017 0307   AST 19 07/22/2017 0802   ALT 20 07/22/2017 0802   ALKPHOS 85 07/22/2017 0802   BILITOT 0.9 07/22/2017 0802   GFRNONAA 7 (L) 07/28/2017 0307   GFRAA 8 (L) 07/28/2017 0307   Lipase     Component Value Date/Time   LIPASE 329 (H) 07/22/2017 0802       Studies/Results: No results found.  Anti-infectives: Anti-infectives (From admission, onward)   Start     Dose/Rate Route Frequency Ordered Stop   07/26/17 1000  linezolid (ZYVOX) IVPB 600 mg  Status:  Discontinued     600 mg 300 mL/hr over 60 Minutes Intravenous Every 12 hours 07/26/17 0820 07/26/17 0820   07/16/17 1600  piperacillin-tazobactam (ZOSYN) IVPB 2.25 g     2.25 g 100 mL/hr over 30 Minutes Intravenous Every 8 hours 07/16/17 1213     07/15/17 1445  vancomycin (VANCOCIN) IVPB 750 mg/150 ml premix  Status:  Discontinued     750 mg 150 mL/hr over 60 Minutes Intravenous To Surgery 07/15/17 1437 07/15/17 2041   07/14/17 2300  piperacillin-tazobactam (ZOSYN) IVPB 3.375 g  Status:  Discontinued     3.375  g 12.5 mL/hr over 240 Minutes Intravenous Every 8 hours 07/13/17 1930 07/14/17 0858   07/14/17 0900  piperacillin-tazobactam (ZOSYN) IVPB 3.375 g  Status:  Discontinued     3.375 g 12.5 mL/hr over 240 Minutes Intravenous Every 8 hours 07/14/17 0858 07/16/17 1213   07/14/17 0445  vancomycin (VANCOCIN) IVPB 750 mg/150 ml premix  Status:  Discontinued     750 mg 150 mL/hr over 60 Minutes Intravenous Every 8 hours 07/14/17 0431 07/16/17 0856   07/13/17 1900  piperacillin-tazobactam (ZOSYN) IVPB 3.375 g  Status:  Discontinued     3.375 g 12.5 mL/hr over 240 Minutes Intravenous Every 8 hours 07/13/17 1847 07/13/17 1930   07/13/17 1530  piperacillin-tazobactam (ZOSYN) IVPB 3.375 g      3.375 g 100 mL/hr over 30 Minutes Intravenous  Once 07/13/17 1515 07/13/17 1636       Assessment/Plan Prior h/o GI bleed with no known cause, workup unrevealing  Complexintra-abdominal abscess S/pex lapSBR and drainage of intra-abdominal abscess5/10 Dr. 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; TRANSMURAL DEFECT;INFLAMED OMENTUM;THE SURGICAL RESECTION MARGINS ARE NEGATIVE FOR TUMOR - VACd/c 5/17, now W2D dressing changes -toleratingsoft dietand having bowel function -JPdrain withdecreased output5cc, still milky in appearance today - CT 5/17 showedRound intermediate density collection in the deep RIGHT pelvis, abscess vs normal postop fluid;No upper abdomen complication following abscess/gastrointestinal stromal tumor resection from small bowel; no leak - WBC trending down, Tmax 100.2 ABL anemia- Hgb 8.4, VSS, s/p 2 units PRBC - ptstill having black stools - potentially due to build up of lovenox with renal failure? lovenox stopped 5/20 - if not improving will consult GI for possible capsule endoscopy  ARF -appreciate nephrology's assistance -cr decreased to 8, BUN to 53 -still making urine  ID -zosynat renal adjusted dose5/8>> VTE -SCDs, hold lovenox with dark tarry stool FEN -soft diet, Boost  Foley -d/c 5/15, strict I&Os Follow up -Dr. Redmond Pulling  Plan -Continue renal diet. Creatinine down,HD today.Continue drain. Patient may go outside.  Continue IV zosynwith renal dosing.    LOS: 15 days    Brigid Re , [facility] 07/28/2017, 8:13 AM Pager: [phone] Consults: [phone] Mon-Fri 7:00 am-4:30 pm Sat-Sun 7:00 am-11:30 am

## 2017-07-28 NOTE — Progress Notes (Signed)
Nutrition Follow-up  INTERVENTION:   Boost Breeze po TID, each supplement provides 250 kcal and 9 grams of protein   Nepro Shake po BID, each supplement provides 425 kcal and 19 grams protein  Discussed with pt importance of adequate nutrition for healing  NUTRITION DIAGNOSIS:   Inadequate oral intake related to decreased appetite as evidenced by meal completion < 50%. Ongoing.   GOAL:   Patient will meet greater than or equal to 90% of their needs Progressing.   MONITOR:   PO intake, Supplement acceptance, Labs, Weight trends  ASSESSMENT:   Pt is a Therapist, sports at [employer] who was admitted 5/8 with abd pain s/p ex lap for jejunal perforation and intraabdominal abscess with I&D, proximal SB resection (path showed GIST with clear margins).   Renal following for AKI HD started 5/21, repeat session 5/22, next session planned for 5/24  JP drain with 5 ml output  Pt started on diet 5/18, per pt his appetite started to come back on Tuesday Pt drinking breeze but states it is very sweet, willing to try Nepro    Medications reviewed and include: fergon, rena-vit Labs reviewed: K+ 3.4 (L), PO4 6.1 (H) Pt 6.8 L positive since admission, weight is up 11 lb since admission   Diet Order:   Diet Order           Diet renal with fluid restriction Fluid restriction: 2000 mL Fluid; Room service appropriate? Yes; Fluid consistency: Thin  Diet effective now          EDUCATION NEEDS:   Education needs have been addressed  Skin:  Skin Assessment: Skin Integrity Issues: Skin Integrity Issues:: Wound VAC Wound Vac: removed  Last BM:  5/23  Height:   Ht Readings from Last 1 Encounters:  07/16/17 5\' 7"  (1.702 m)    Weight:   Wt Readings from Last 1 Encounters:  07/27/17 174 lb 2.6 oz (79 kg)    Ideal Body Weight:  67.3 kg  BMI:  Body mass index is 27.28 kg/m.  Estimated Nutritional Needs:   Kcal:  2000-2200  Protein:  95-110 grams  Fluid:  2.0-2.2 L  Maylon Peppers RD, LDN,  CNSC [phone] Pager [phone] After Hours Pager

## 2017-07-28 NOTE — Progress Notes (Signed)
[facility] Rounding Note  Background: 37 y.o. 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 Baseline creatinine 1.24 . Rec'd V/Z on adm, had IV contrast X2 on 5/9. AKI. Temp cath placed 5/20 for initiation of HD for creatinine of 15, uremic sx. HD#1 5/20. #2 5/22  Assessment/Plan:  1. AKI -  abdominal surgery/IV contrast/vanc. Bland urine. U/S good sized kidneys- echogenic - creatinine pre insult 1.24.  Making some urine but no GFR. Vas cath placed by IR 5/20. HD #1 5/21, #2 5/22, planning for #3 for 5/24. Still hopeful for renal  recovery.  2. S/p xlap for perf bowel/intraabd abscess - path + for GIST clear margins 3. Fever - drain fluid cloudier. ? If will be reimaged? Remains on zosyn.  4. ABLA with dark tarry stools. No heparin with HD. 2U PRBC's today 5/22. Aranesp 100 dosed 5/21 5. Metabolic acidosis- bicarb up to 21. Stop oral biarb. 6. Hyperphosphatemia  - no action yet - do not want to introduce binder into tenuous GI situation  7. Rash - pruritic, maculopapular. New. ? Zosyn. Says surgeons are aware  Jamal Maes, MD [facility] [phone] Pager 07/28/2017, 1:31 PM   Subjective:   Got temp cath IR 5/20, 1st HD 5/20, 2nd 5/22 Transfused 2 U with HD yesterday 3L off, post weight 79, still 5 kg over admit weight Anticipate next treatment 5/24 Making a little more urine today - voided 550 earlier today Still having black stools Developing a rash - started last PM - now on chest, shoulders, arms, prox thighs No prior med allergies but has been on zosyn for a bit   Objective Vital signs in last 24 hours: Vitals:   07/27/17 1302 07/27/17 1440 07/27/17 2138 07/28/17 0427  BP: (!) 154/96 (!) 154/93 136/81 (!) 152/94  Pulse: 80 95 83 94  Resp: 18   18  Temp: 98.2 F (36.8 C) 100.2 F (37.9 C) 99.2 F (37.3 C) 99.3 F (37.4 C)  TempSrc: Oral Oral Oral Oral  SpO2:  98% 99%  95%  Weight:      Height:       Weight change: -0.6 kg (-1 lb 5.2 oz)  Intake/Output Summary (Last 24 hours) at 07/28/2017 1331 Last data filed at 07/28/2017 0800 Gross per 24 hour  Intake 570 ml  Output 1380 ml  Net -810 ml   Physical Exam: VS as noted Pre weight 82 kg/post 79 w/5/22 HD No JVD Lungs clear S1S2 No S3 or rub Abd some BS Dressing in place (photo from Dr. Richarda Blade prior note reviewed) Drain with milky looking fluid (prev clear) Pitting edema LE's resolved Fine maculopapular rash chest, shoulders, legs - new Temp cath (5/20)  R IJ dry dressing dry  Recent Labs  Lab 07/26/17 0453 07/27/17 0407 07/28/17 0307  NA 133* 134* 136  K 3.9 3.4* 3.4*  CL 99* 99* 100*  CO2 11* 21* 24  GLUCOSE 97 102* 118*  BUN 102* 72* 53*  CREATININE 16.95* 12.31* 8.72*  CALCIUM 9.4 8.4* 8.0*  PHOS 12.6* 8.4* 6.1*    Recent Labs  Lab 07/22/17 0802  07/26/17 0453 07/27/17 0407 07/28/17 0307  AST 19  --   --   --   --   ALT 20  --   --   --   --   ALKPHOS 85  --   --   --   --  BILITOT 0.9  --   --   --   --   PROT 5.3*  --   --   --   --   ALBUMIN 2.2*   < > 2.2* 2.1* 2.2*   < > = values in this interval not displayed.   Recent Labs  Lab 07/22/17 0802  LIPASE 329*    Recent Labs  Lab 07/24/17 0601 07/25/17 0335 07/26/17 0453 07/27/17 0407 07/28/17 0307  WBC 14.8* 12.7* 11.7* 10.5 8.9  HGB 9.0* 8.2* 7.9* 7.4* 8.4*  HCT 24.9* 23.4* 22.2* 20.6* 23.6*  MCV 88.6 90.3 88.4 86.6 86.8  PLT 580* 507* 513* 452* 429*   Studies/Results: Ir Fluoro Guide Cv Line Right  Result Date: 07/25/2017 INDICATION: ATN.  Nephrotoxicity. EXAM: TUNNELED DIALYSIS CATHETER PLACEMENT, ULTRASOUND GUIDANCE FOR VASCULAR ACCESS MEDICATIONS: None ANESTHESIA/SEDATION: None FLUOROSCOPY TIME:  Fluoroscopy Time:  minutes 30 seconds (2 mGy). COMPLICATIONS: None immediate. PROCEDURE: The right neck was prepped and draped in a sterile fashion. 1% lidocaine was utilized for local anesthesia. Under  sonographic guidance, a micropuncture needle was inserted into the right jugular vein and removed over a 018 wire which was up sized to an Amplatz. Twelve [place] dilator followed by the 12 [place] dialysis catheter were inserted. It was flushed and sewn in place. FINDINGS: Images document a temporary right jugular dialysis catheter with its tip at the cavoatrial junction. IMPRESSION: Successful right IJ vein temporary dialysis catheter with its tip at the cavoatrial junction. Electronically Signed   By: Marybelle Killings M.D.   On: 07/25/2017 09:53   Ir US Guide Vasc Access Right  Result Date: 07/25/2017 INDICATION: ATN.  Nephrotoxicity. EXAM: TUNNELED DIALYSIS CATHETER PLACEMENT, ULTRASOUND GUIDANCE FOR VASCULAR ACCESS MEDICATIONS: None ANESTHESIA/SEDATION: None FLUOROSCOPY TIME:  Fluoroscopy Time:  minutes 30 seconds (2 mGy). COMPLICATIONS: None immediate. PROCEDURE: The right neck was prepped and draped in a sterile fashion. 1% lidocaine was utilized for local anesthesia. Under sonographic guidance, a micropuncture needle was inserted into the right jugular vein and removed over a 018 wire which was up sized to an Amplatz. Twelve [place] dilator followed by the 12 [place] dialysis catheter were inserted. It was flushed and sewn in place. FINDINGS: Images document a temporary right jugular dialysis catheter with its tip at the cavoatrial junction. IMPRESSION: Successful right IJ vein temporary dialysis catheter with its tip at the cavoatrial junction. Electronically Signed   By: Marybelle Killings M.D.   On: 07/25/2017 09:53   Medications: Infusions: . sodium chloride    . piperacillin-tazobactam (ZOSYN)  IV Stopped (07/28/17 0845)    Scheduled Medications: . darbepoetin (ARANESP) injection - DIALYSIS  100 mcg Intravenous Q Tue-HD  . feeding supplement  1 Container Oral BID BM  . ferrous gluconate  324 mg Oral QPC supper  . methocarbamol  500 mg Oral TID  . multivitamin  1 tablet Oral QHS  . pantoprazole  40  mg Oral Daily

## 2017-07-29 LAB — RENAL FUNCTION PANEL
Albumin: 2.3 g/dL — ABNORMAL LOW (ref 3.5–5.0)
Anion gap: 13 (ref 5–15)
BUN: 60 mg/dL — ABNORMAL HIGH (ref 6–20)
CO2: 23 mmol/L (ref 22–32)
Calcium: 8.5 mg/dL — ABNORMAL LOW (ref 8.9–10.3)
Chloride: 99 mmol/L — ABNORMAL LOW (ref 101–111)
Creatinine, Ser: 10.2 mg/dL — ABNORMAL HIGH (ref 0.61–1.24)
GFR calc Af Amer: 7 mL/min — ABNORMAL LOW (ref >60)
GFR calc non Af Amer: 6 mL/min — ABNORMAL LOW (ref >60)
Glucose, Bld: 96 mg/dL (ref 65–99)
Phosphorus: 7.1 mg/dL — ABNORMAL HIGH (ref 2.5–4.6)
Potassium: 3.7 mmol/L (ref 3.5–5.1)
Sodium: 135 mmol/L (ref 135–145)

## 2017-07-29 LAB — CBC WITH DIFFERENTIAL/PLATELET
Abs Immature Granulocytes: 0.1 10*3/uL (ref 0.0–0.1)
Basophils Absolute: 0.1 10*3/uL (ref 0.0–0.1)
Basophils Relative: 1 %
Eosinophils Absolute: 0.3 10*3/uL (ref 0.0–0.7)
Eosinophils Relative: 3 %
HCT: 23.1 % — ABNORMAL LOW (ref 39.0–52.0)
Hemoglobin: 8.1 g/dL — ABNORMAL LOW (ref 13.0–17.0)
Immature Granulocytes: 2 %
Lymphocytes Relative: 14 %
Lymphs Abs: 1.3 10*3/uL (ref 0.7–4.0)
MCH: 31 pg (ref 26.0–34.0)
MCHC: 35.1 g/dL (ref 30.0–36.0)
MCV: 88.5 fL (ref 78.0–100.0)
Monocytes Absolute: 1.1 10*3/uL — ABNORMAL HIGH (ref 0.1–1.0)
Monocytes Relative: 12 %
Neutro Abs: 6.2 10*3/uL (ref 1.7–7.7)
Neutrophils Relative %: 68 %
Platelets: 365 10*3/uL (ref 150–400)
RBC: 2.61 MIL/uL — ABNORMAL LOW (ref 4.22–5.81)
RDW: 14.2 % (ref 11.5–15.5)
WBC: 9 10*3/uL (ref 4.0–10.5)

## 2017-07-29 NOTE — Progress Notes (Signed)
[facility] Rounding Note  Background: 37 y.o. 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 Baseline creatinine 1.24 . Rec'd V/Z on adm, had IV contrast X2 on 5/9. AKI. Temp cath placed 5/20 for initiation of HD for creatinine of 15, uremic sx. HD#1 5/20. #2 5/22 #3 5/24  Assessment/Plan:  1. AKI -  abdominal surgery/IV contrast/vanc. Bland urine. U/S good sized kidneys- echogenic - creatinine pre insult 1.24.  Making some urine but no GFR. Vas cath placed by IR 5/20. HD #1 5/21, #2 5/22, #3 5/24. Making more urine, just no GFR yet (pre HD creatinine 10).   2. S/p xlap for perf bowel/intraabd abscess - path + for GIST clear margins 3. ABLA with dark tarry stools. No heparin with HD. 2U PRBC's  5/22. Aranesp 100 dosed 5/21. Has had GIB in past w/neg EGD/colon 2016 (says capsule endo was mentioned at that time not needed then). *  4. Metabolic acidosis- bicarb up to 21. Stopped oral biarb. 5. Hyperphosphatemia  - no action yet - do not want to introduce binder into tenuous GI situation  6. Rash - pruritic, maculopapular. New. ? Zosyn. Says surgeons are aware. Looks better to me, faded some past   Jamal Maes, MD [place] Pager 07/29/2017, 8:31 AM   Subjective:   Had temp cath IR 5/20, 1st HD 5/20, 2nd 5/22, #3 today 5/24 Transfused with last HD 5/22 for GI blood loss Starting to make more urine (clearly) UF goal today 3-4 liters Pre HD creatinine 10.2 so no GFR yet Rash - to me looks much better - faded on shoulders  Objective Vital signs in last 24 hours: Vitals:   07/29/17 0655 07/29/17 0706 07/29/17 0730 07/29/17 0800  BP: (!) 144/88 (!) 145/88 (!) 149/80 (!) 151/86  Pulse: 82 80 90 85  Resp: 18 18 18    Temp: 98.7 F (37.1 C)     TempSrc: Oral     SpO2: 97%     Weight:  79.6 kg (175 lb 7.8 oz)    Height:       Weight change:   Intake/Output Summary (Last 24  hours) at 07/29/2017 0831 Last data filed at 07/29/2017 0700 Gross per 24 hour  Intake 175 ml  Output 1290 ml  Net -1115 ml   Physical Exam: VS as noted No JVD Lungs clear S1S2 No S3 or rub Abd some BS Dressing in place (photo from Dr. Richarda Blade prior note reviewed) Drain fluid now more serosanguinous than cloudy Pitting edema LE's resolved Fine maculopapular rash chest, shoulders, legs - new yesterday - faded a lot today Temp cath (5/20)  R IJ dry dressing dry  Recent Labs  Lab 07/27/17 0407 07/28/17 0307 07/29/17 0406  NA 134* 136 135  K 3.4* 3.4* 3.7  CL 99* 100* 99*  CO2 21* 24 23  GLUCOSE 102* 118* 96  BUN 72* 53* 60*  CREATININE 12.31* 8.72* 10.20*  CALCIUM 8.4* 8.0* 8.5*  PHOS 8.4* 6.1* 7.1*    Recent Labs  Lab 07/27/17 0407 07/28/17 0307 07/29/17 0406  ALBUMIN 2.1* 2.2* 2.3*     Recent Labs  Lab 07/25/17 0335 07/26/17 0453 07/27/17 0407 07/28/17 0307 07/29/17 0406  WBC 12.7* 11.7* 10.5 8.9 9.0  NEUTROABS  --   --   --   --  6.2  HGB 8.2* 7.9* 7.4* 8.4* 8.1*  HCT 23.4* 22.2* 20.6* 23.6* 23.1*  MCV  90.3 88.4 86.6 86.8 88.5  PLT 507* 513* 452* 429* 365   Studies/Results: Ir Fluoro Guide Cv Line Right  Result Date: 07/25/2017 INDICATION: ATN.  Nephrotoxicity. EXAM: TUNNELED DIALYSIS CATHETER PLACEMENT, ULTRASOUND GUIDANCE FOR VASCULAR ACCESS MEDICATIONS: None ANESTHESIA/SEDATION: None FLUOROSCOPY TIME:  Fluoroscopy Time:  minutes 30 seconds (2 mGy). COMPLICATIONS: None immediate. PROCEDURE: The right neck was prepped and draped in a sterile fashion. 1% lidocaine was utilized for local anesthesia. Under sonographic guidance, a micropuncture needle was inserted into the right jugular vein and removed over a 018 wire which was up sized to an Amplatz. Twelve [place] dilator followed by the 12 [place] dialysis catheter were inserted. It was flushed and sewn in place. FINDINGS: Images document a temporary right jugular dialysis catheter with its tip at the  cavoatrial junction. IMPRESSION: Successful right IJ vein temporary dialysis catheter with its tip at the cavoatrial junction. Electronically Signed   By: Marybelle Killings M.D.   On: 07/25/2017 09:53   Ir US Guide Vasc Access Right  Result Date: 07/25/2017 INDICATION: ATN.  Nephrotoxicity. EXAM: TUNNELED DIALYSIS CATHETER PLACEMENT, ULTRASOUND GUIDANCE FOR VASCULAR ACCESS MEDICATIONS: None ANESTHESIA/SEDATION: None FLUOROSCOPY TIME:  Fluoroscopy Time:  minutes 30 seconds (2 mGy). COMPLICATIONS: None immediate. PROCEDURE: The right neck was prepped and draped in a sterile fashion. 1% lidocaine was utilized for local anesthesia. Under sonographic guidance, a micropuncture needle was inserted into the right jugular vein and removed over a 018 wire which was up sized to an Amplatz. Twelve [place] dilator followed by the 12 [place] dialysis catheter were inserted. It was flushed and sewn in place. FINDINGS: Images document a temporary right jugular dialysis catheter with its tip at the cavoatrial junction. IMPRESSION: Successful right IJ vein temporary dialysis catheter with its tip at the cavoatrial junction. Electronically Signed   By: Marybelle Killings M.D.   On: 07/25/2017 09:53   Medications: Infusions: . sodium chloride    . piperacillin-tazobactam (ZOSYN)  IV Stopped (07/29/17 0045)    Scheduled Medications: . darbepoetin (ARANESP) injection - DIALYSIS  100 mcg Intravenous Q Tue-HD  . feeding supplement  1 Container Oral BID BM  . feeding supplement (NEPRO CARB STEADY)  237 mL Oral BID BM  . ferrous gluconate  324 mg Oral QPC supper  . methocarbamol  500 mg Oral TID  . multivitamin  1 tablet Oral QHS  . pantoprazole  40 mg Oral Daily

## 2017-07-29 NOTE — Progress Notes (Signed)
[facility] Progress Note  14 Days Post-Op  Subjective: CC- HD Currently in HD. Creatinine went back up a little today 10.2 from 8.72, UOP up to 1830cc/24hr.  Denies any abdominal pain, nausea, or vomiting. States that his appetite has gotten better. Tolerating soft diet. He had 3 soft BMs yesterday, all dark no frank blood.   BP has been a little elevated, 144/93. States that his baseline is ~120/80. Denies any increased pain. Denies headache, blurry vision.  Objective: Vital signs in last 24 hours: Temp:  [98.7 F (37.1 C)-99.3 F (37.4 C)] 98.7 F (37.1 C) (05/24 0655) Pulse Rate:  [80-90] 82 (05/24 0900) Resp:  [16-18] 18 (05/24 0730) BP: (144-161)/(80-102) 144/93 (05/24 0900) SpO2:  [97 %-99 %] 97 % (05/24 0655) Weight:  [79.6 kg (175 lb 7.8 oz)] 79.6 kg (175 lb 7.8 oz) (05/24 0706) Last BM Date: 07/28/17  Intake/Output from previous day: 05/23 0701 - 05/24 0700 In: 175 [P.O.:165; I.V.:10] Out: 1840 [Urine:1830; Drains:10] Intake/Output this shift: No intake/output data recorded.  PE: Gen: Alert, NAD, pleasant HEENT: EOM's intact, pupils equal and round Card: RRR, no M/G/R heard Pulm: CTAB, no W/R/R, effort normal Abd: Soft,ND, NT, +BS,open midline incision shallow cdi with beefy red tissue/no erythema or drainage,drain with minimal cloudy/tan fluid DXA:JOINOM soft and nontender, minimal edema in feet. 1+ edema BUE Psych: A&Ox3  Skin: no rashes noted, warm and dry  Lab Results:  Recent Labs    07/28/17 0307 07/29/17 0406  WBC 8.9 9.0  HGB 8.4* 8.1*  HCT 23.6* 23.1*  PLT 429* 365   BMET Recent Labs    07/28/17 0307 07/29/17 0406  NA 136 135  K 3.4* 3.7  CL 100* 99*  CO2 24 23  GLUCOSE 118* 96  BUN 53* 60*  CREATININE 8.72* 10.20*  CALCIUM 8.0* 8.5*   PT/INR No results for input(s): LABPROT, INR in the last 72 hours. CMP     Component Value Date/Time   NA 135 07/29/2017 0406   K 3.7 07/29/2017 0406   CL 99 (L)  07/29/2017 0406   CO2 23 07/29/2017 0406   GLUCOSE 96 07/29/2017 0406   BUN 60 (H) 07/29/2017 0406   CREATININE 10.20 (H) 07/29/2017 0406   CALCIUM 8.5 (L) 07/29/2017 0406   PROT 5.3 (L) 07/22/2017 0802   ALBUMIN 2.3 (L) 07/29/2017 0406   AST 19 07/22/2017 0802   ALT 20 07/22/2017 0802   ALKPHOS 85 07/22/2017 0802   BILITOT 0.9 07/22/2017 0802   GFRNONAA 6 (L) 07/29/2017 0406   GFRAA 7 (L) 07/29/2017 0406   Lipase     Component Value Date/Time   LIPASE 329 (H) 07/22/2017 0802       Studies/Results: No results found.  Anti-infectives: Anti-infectives (From admission, onward)   Start     Dose/Rate Route Frequency Ordered Stop   07/26/17 1000  linezolid (ZYVOX) IVPB 600 mg  Status:  Discontinued     600 mg 300 mL/hr over 60 Minutes Intravenous Every 12 hours 07/26/17 0820 07/26/17 0820   07/16/17 1600  piperacillin-tazobactam (ZOSYN) IVPB 2.25 g     2.25 g 100 mL/hr over 30 Minutes Intravenous Every 8 hours 07/16/17 1213     07/15/17 1445  vancomycin (VANCOCIN) IVPB 750 mg/150 ml premix  Status:  Discontinued     750 mg 150 mL/hr over 60 Minutes Intravenous To Surgery 07/15/17 1437 07/15/17 2041   07/14/17 2300  piperacillin-tazobactam (ZOSYN) IVPB 3.375 g  Status:  Discontinued  3.375 g 12.5 mL/hr over 240 Minutes Intravenous Every 8 hours 07/13/17 1930 07/14/17 0858   07/14/17 0900  piperacillin-tazobactam (ZOSYN) IVPB 3.375 g  Status:  Discontinued     3.375 g 12.5 mL/hr over 240 Minutes Intravenous Every 8 hours 07/14/17 0858 07/16/17 1213   07/14/17 0445  vancomycin (VANCOCIN) IVPB 750 mg/150 ml premix  Status:  Discontinued     750 mg 150 mL/hr over 60 Minutes Intravenous Every 8 hours 07/14/17 0431 07/16/17 0856   07/13/17 1900  piperacillin-tazobactam (ZOSYN) IVPB 3.375 g  Status:  Discontinued     3.375 g 12.5 mL/hr over 240 Minutes Intravenous Every 8 hours 07/13/17 1847 07/13/17 1930   07/13/17 1530  piperacillin-tazobactam (ZOSYN) IVPB 3.375 g     3.375  g 100 mL/hr over 30 Minutes Intravenous  Once 07/13/17 1515 07/13/17 1636       Assessment/Plan Prior h/o GI bleed with no known cause, workup unrevealing  Complexintra-abdominal abscess S/pex lapSBR and drainage of intra-abdominal abscess5/10 Dr. 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 ABL anemia- 2U PRBC's  5/22. Hgb 8.1 from 8.4, stable, repeat CBC in AM - persistent dark stools, hemogloin seems to have stabilized. Due to iron supplements? Continue to monitor hemoglobin and hold lovenox. If not improving will consult GI for possible capsule endoscopy  ARF -appreciate nephrology's assistance -HD started on 5/20 -crup a little today 10.2 from 8.72, BUN 60 -UOP improving 1830cc/24hr  Rash - pruritic and maculopapular, was on chest and behind ears. No resolved. Continue to monitor.  ID -zosyn5/8>>5/24 VTE -SCDs, hold lovenox with dark tarry stool FEN -soft diet, Boost/protein shakes Foley -d/c 5/15, strict I&Os Follow up -Dr. Redmond Pulling  Plan -D/c zosyn. Continue JP drain and monitor output. HD today. Repeat CBC in AM.   LOS: 16 days    Wellington Hampshire , [facility] Surgery 07/29/2017, 10:21 AM Pager: [phone] Consults: [phone] Mon-Fri 7:00 am-4:30 pm Sat-Sun 7:00 am-11:30 am

## 2017-07-29 NOTE — Procedures (Signed)
I have personally attended this patient's dialysis session.   Using R temp cath no issues No heparin 2/2 GI blood loss issues 4K bath for K 3.7 UF goal 3-4L  Jamal Maes, MD [facility] [phone] Pager 07/29/2017, 8:43 AM

## 2017-07-30 LAB — CBC
HCT: 24 % — ABNORMAL LOW (ref 39.0–52.0)
Hemoglobin: 8.3 g/dL — ABNORMAL LOW (ref 13.0–17.0)
MCH: 30.9 pg (ref 26.0–34.0)
MCHC: 34.6 g/dL (ref 30.0–36.0)
MCV: 89.2 fL (ref 78.0–100.0)
Platelets: 413 10*3/uL — ABNORMAL HIGH (ref 150–400)
RBC: 2.69 MIL/uL — ABNORMAL LOW (ref 4.22–5.81)
RDW: 14.1 % (ref 11.5–15.5)
WBC: 8.9 10*3/uL (ref 4.0–10.5)

## 2017-07-30 LAB — RENAL FUNCTION PANEL
Albumin: 2.3 g/dL — ABNORMAL LOW (ref 3.5–5.0)
Anion gap: 12 (ref 5–15)
BUN: 35 mg/dL — ABNORMAL HIGH (ref 6–20)
CO2: 26 mmol/L (ref 22–32)
Calcium: 8.2 mg/dL — ABNORMAL LOW (ref 8.9–10.3)
Chloride: 98 mmol/L — ABNORMAL LOW (ref 101–111)
Creatinine, Ser: 6.37 mg/dL — ABNORMAL HIGH (ref 0.61–1.24)
GFR calc Af Amer: 12 mL/min — ABNORMAL LOW (ref >60)
GFR calc non Af Amer: 10 mL/min — ABNORMAL LOW (ref >60)
Glucose, Bld: 92 mg/dL (ref 65–99)
Phosphorus: 5.4 mg/dL — ABNORMAL HIGH (ref 2.5–4.6)
Potassium: 4.1 mmol/L (ref 3.5–5.1)
Sodium: 136 mmol/L (ref 135–145)

## 2017-07-30 NOTE — Progress Notes (Addendum)
[facility] Rounding Note  Background: 37 y.o. 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 Baseline creatinine 1.24 . Rec'd V/Z on adm, had IV contrast X2 on 5/9. Developed AKI. Temp cath placed 5/20 for initiation of HD for creatinine of 15, uremic sx. HD#1 5/20. #2 5/22 #3 5/24  Assessment/Plan:  1. AKI -  abdominal surgery/IV contrast/vanc. Bland urine. U/S good sized kidneys- echogenic - creatinine pre insult 1.24.  Vas cath placed by IR 5/20. HD #1 5/21, #2 5/22, #3 5/24. Making more urine. Better creatinine today just reflects HD yesterday, tomorrow's lab will be more informative 1. Trend renal function and UOP through the weekend  2. HD prn  2. S/p xlap for perf bowel/intraabd abscess - path + for GIST clear margins 3. ABLA with dark tarry stools. No heparin with HD. 2U PRBC's  5/22. Aranesp 100 dosed 5/21. Has had GIB in past w/neg EGD/colon 2016 (says capsule endo was mentioned at that time not needed then). *  4. Metabolic acidosis - resolved 5. Hyperphosphatemia  - no action yet - do not want to introduce binder into tenuous GI situation - getting better without intervention  6. Rash - pruritic, maculopapular. New. ? Zosyn. Says surgeons are aware. Looks better to me, faded some past   Jamal Maes, MD [facility] Pager 07/30/2017, 1:40 PM   Subjective:   Had temp cath IR 5/20, 1st HD 5/20, 2nd 5/22, #3 5/24 Transfused with HD 5/22  UOP is increasing "Walked a mile" and went outside earlier Rash still a little itchy but fading  Objective Vital signs in last 24 hours: Vitals:   07/29/17 1147 07/29/17 1332 07/29/17 2359 07/30/17 0620  BP: (!) 139/93 (!) 145/93 (!) 141/95 (!) 131/92  Pulse: 84 82 85 95  Resp:  16 16 16   Temp: 99.1 F (37.3 C) 99 F (37.2 C) 99.4 F (37.4 C) 99.6 F (37.6 C)  TempSrc: Oral Oral Oral Oral  SpO2: 100% 99% 98% 98%  Weight:       Height:       Weight change:   Intake/Output Summary (Last 24 hours) at 07/30/2017 1340 Last data filed at 07/29/2017 1600 Gross per 24 hour  Intake 115 ml  Output 650 ml  Net -535 ml   Physical Exam: VS as noted NAD No JVD Lungs clear S1S2 No S3 or rub Abd some BS Dressing in place (photo from Dr. Richarda Blade prior note reviewed) Drain fluid now more serosanguinous than cloudy Pitting edema LE's resolved Fine maculopapular rash chest, shoulders, legs - continues to fade Temp cath (5/20)  R IJ dry dressing dry  Recent Labs  Lab 07/28/17 0307 07/29/17 0406 07/30/17 0403  NA 136 135 136  K 3.4* 3.7 4.1  CL 100* 99* 98*  CO2 24 23 26   GLUCOSE 118* 96 92  BUN 53* 60* 35*  CREATININE 8.72* 10.20* 6.37*  CALCIUM 8.0* 8.5* 8.2*  PHOS 6.1* 7.1* 5.4*    Recent Labs  Lab 07/28/17 0307 07/29/17 0406 07/30/17 0403  ALBUMIN 2.2* 2.3* 2.3*     Recent Labs  Lab 07/26/17 0453 07/27/17 0407 07/28/17 0307 07/29/17 0406 07/30/17 0403  WBC 11.7* 10.5 8.9 9.0 8.9  NEUTROABS  --   --   --  6.2  --   HGB 7.9* 7.4* 8.4* 8.1* 8.3*  HCT 22.2* 20.6* 23.6* 23.1* 24.0*  MCV 88.4 86.6 86.8 88.5 89.2  PLT 513* 452* 429* 365 413*   Studies/Results: Ir Fluoro Guide Cv Line Right  Result Date: 07/25/2017 INDICATION: ATN.  Nephrotoxicity. EXAM: TUNNELED DIALYSIS CATHETER PLACEMENT, ULTRASOUND GUIDANCE FOR VASCULAR ACCESS MEDICATIONS: None ANESTHESIA/SEDATION: None FLUOROSCOPY TIME:  Fluoroscopy Time:  minutes 30 seconds (2 mGy). COMPLICATIONS: None immediate. PROCEDURE: The right neck was prepped and draped in a sterile fashion. 1% lidocaine was utilized for local anesthesia. Under sonographic guidance, a micropuncture needle was inserted into the right jugular vein and removed over a 018 wire which was up sized to an Amplatz. Twelve [place] dilator followed by the 12 [place] dialysis catheter were inserted. It was flushed and sewn in place. FINDINGS: Images document a temporary right  jugular dialysis catheter with its tip at the cavoatrial junction. IMPRESSION: Successful right IJ vein temporary dialysis catheter with its tip at the cavoatrial junction. Electronically Signed   By: Marybelle Killings M.D.   On: 07/25/2017 09:53   Ir US Guide Vasc Access Right  Result Date: 07/25/2017 INDICATION: ATN.  Nephrotoxicity. EXAM: TUNNELED DIALYSIS CATHETER PLACEMENT, ULTRASOUND GUIDANCE FOR VASCULAR ACCESS MEDICATIONS: None ANESTHESIA/SEDATION: None FLUOROSCOPY TIME:  Fluoroscopy Time:  minutes 30 seconds (2 mGy). COMPLICATIONS: None immediate. PROCEDURE: The right neck was prepped and draped in a sterile fashion. 1% lidocaine was utilized for local anesthesia. Under sonographic guidance, a micropuncture needle was inserted into the right jugular vein and removed over a 018 wire which was up sized to an Amplatz. Twelve [place] dilator followed by the 12 [place] dialysis catheter were inserted. It was flushed and sewn in place. FINDINGS: Images document a temporary right jugular dialysis catheter with its tip at the cavoatrial junction. IMPRESSION: Successful right IJ vein temporary dialysis catheter with its tip at the cavoatrial junction. Electronically Signed   By: Marybelle Killings M.D.   On: 07/25/2017 09:53   Medications: Infusions: . sodium chloride      Scheduled Medications: . darbepoetin (ARANESP) injection - DIALYSIS  100 mcg Intravenous Q Tue-HD  . feeding supplement  1 Container Oral BID BM  . feeding supplement (NEPRO CARB STEADY)  237 mL Oral BID BM  . ferrous gluconate  324 mg Oral QPC supper  . methocarbamol  500 mg Oral TID  . multivitamin  1 tablet Oral QHS  . pantoprazole  40 mg Oral Daily

## 2017-07-30 NOTE — Progress Notes (Addendum)
Oncall MD paged about elevated BP 150/103. Waiting return call.  MD stated to continue to monitor. If BP continues to be elevated over the next few readings to please notify him.

## 2017-07-30 NOTE — Progress Notes (Signed)
[facility] Progress Note  15 Days Post-Op  Subjective: CC: wants to go home Hopefull that his kidney function will continue to improve. Voided almost 1L this AM. Appetite good, was drained after dialysis yesterday but hoping to go outside today. Having more "normal" stools for the last 2 days. Denies nausea or vomiting.   Objective: Vital signs in last 24 hours: Temp:  [98 F (36.7 C)-99.6 F (37.6 C)] 99.6 F (37.6 C) (05/25 0620) Pulse Rate:  [78-95] 95 (05/25 0620) Resp:  [16-20] 16 (05/25 0620) BP: (131-151)/(56-95) 131/92 (05/25 0620) SpO2:  [98 %-100 %] 98 % (05/25 0620) Weight:  [77 kg (169 lb 12.1 oz)] 77 kg (169 lb 12.1 oz) (05/24 1106) Last BM Date: 07/28/17  Intake/Output from previous day: 05/24 0701 - 05/25 0700 In: 115 [P.O.:115] Out: 3150 [Urine:650] Intake/Output this shift: No intake/output data recorded.  PE: Gen: Alert, NAD, pleasant Card: Regular rate and rhythm, pedal pulses 2+ BL, extremity edema improving Pulm: Normal effort, clear to auscultation bilaterally Abd: Soft, non-tender, non-distended, bowel sounds present, no HSM,midline woundclean with good granulation tissue-blister noted at umbilicus; JP withclear SS drainage Skin: warm and dry, no rashes  Psych: A&Ox3    Lab Results:  Recent Labs    07/29/17 0406 07/30/17 0403  WBC 9.0 8.9  HGB 8.1* 8.3*  HCT 23.1* 24.0*  PLT 365 413*   BMET Recent Labs    07/29/17 0406 07/30/17 0403  NA 135 136  K 3.7 4.1  CL 99* 98*  CO2 23 26  GLUCOSE 96 92  BUN 60* 35*  CREATININE 10.20* 6.37*  CALCIUM 8.5* 8.2*   PT/INR No results for input(s): LABPROT, INR in the last 72 hours. CMP     Component Value Date/Time   NA 136 07/30/2017 0403   K 4.1 07/30/2017 0403   CL 98 (L) 07/30/2017 0403   CO2 26 07/30/2017 0403   GLUCOSE 92 07/30/2017 0403   BUN 35 (H) 07/30/2017 0403   CREATININE 6.37 (H) 07/30/2017 0403   CALCIUM 8.2 (L) 07/30/2017 0403   PROT 5.3 (L)  07/22/2017 0802   ALBUMIN 2.3 (L) 07/30/2017 0403   AST 19 07/22/2017 0802   ALT 20 07/22/2017 0802   ALKPHOS 85 07/22/2017 0802   BILITOT 0.9 07/22/2017 0802   GFRNONAA 10 (L) 07/30/2017 0403   GFRAA 12 (L) 07/30/2017 0403   Lipase     Component Value Date/Time   LIPASE 329 (H) 07/22/2017 0802       Studies/Results: No results found.  Anti-infectives: Anti-infectives (From admission, onward)   Start     Dose/Rate Route Frequency Ordered Stop   07/26/17 1000  linezolid (ZYVOX) IVPB 600 mg  Status:  Discontinued     600 mg 300 mL/hr over 60 Minutes Intravenous Every 12 hours 07/26/17 0820 07/26/17 0820   07/16/17 1600  piperacillin-tazobactam (ZOSYN) IVPB 2.25 g  Status:  Discontinued     2.25 g 100 mL/hr over 30 Minutes Intravenous Every 8 hours 07/16/17 1213 07/29/17 1031   07/15/17 1445  vancomycin (VANCOCIN) IVPB 750 mg/150 ml premix  Status:  Discontinued     750 mg 150 mL/hr over 60 Minutes Intravenous To Surgery 07/15/17 1437 07/15/17 2041   07/14/17 2300  piperacillin-tazobactam (ZOSYN) IVPB 3.375 g  Status:  Discontinued     3.375 g 12.5 mL/hr over 240 Minutes Intravenous Every 8 hours 07/13/17 1930 07/14/17 0858   07/14/17 0900  piperacillin-tazobactam (ZOSYN) IVPB 3.375 g  Status:  Discontinued  3.375 g 12.5 mL/hr over 240 Minutes Intravenous Every 8 hours 07/14/17 0858 07/16/17 1213   07/14/17 0445  vancomycin (VANCOCIN) IVPB 750 mg/150 ml premix  Status:  Discontinued     750 mg 150 mL/hr over 60 Minutes Intravenous Every 8 hours 07/14/17 0431 07/16/17 0856   07/13/17 1900  piperacillin-tazobactam (ZOSYN) IVPB 3.375 g  Status:  Discontinued     3.375 g 12.5 mL/hr over 240 Minutes Intravenous Every 8 hours 07/13/17 1847 07/13/17 1930   07/13/17 1530  piperacillin-tazobactam (ZOSYN) IVPB 3.375 g     3.375 g 100 mL/hr over 30 Minutes Intravenous  Once 07/13/17 1515 07/13/17 1636       Assessment/Plan Prior h/o GI bleed with no known cause, workup  unrevealing  Complexintra-abdominal abscess S/pex lapSBR and drainage of intra-abdominal abscess5/10 Dr. 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 ABL anemia- 2U PRBC's 5/22. Hgb 8.4, stable - stool character has improved, would not resume lovenox at this time  ARF -appreciate nephrology's assistance -HD started on 5/20 -crdown today 6.37, BUN 35. GFR 10 -UOP improving   Rash - improved  ID -zosyn5/8>>5/24 VTE -SCDs, no lovenox FEN -soft diet, Boost/protein shakes Foley -d/c 5/15, strict I&Os Follow up -Dr. Redmond Pulling  Plan -Patient ready for discharge from a surgical standpoint. Discussed with nephrology and they insist patient needs to remain in the hospital until it is clear whether his kidneys are going to improve.     LOS: 17 days    Brigid Re , [facility] Surgery 07/30/2017, 7:46 AM Pager: [phone] Consults: [phone] Mon-Fri 7:00 am-4:30 pm Sat-Sun 7:00 am-11:30 am

## 2017-07-31 ENCOUNTER — Inpatient Hospital Stay (HOSPITAL_COMMUNITY): Payer: 59

## 2017-07-31 LAB — RENAL FUNCTION PANEL
Albumin: 2.5 g/dL — ABNORMAL LOW (ref 3.5–5.0)
Anion gap: 12 (ref 5–15)
BUN: 48 mg/dL — ABNORMAL HIGH (ref 6–20)
CO2: 25 mmol/L (ref 22–32)
Calcium: 8.3 mg/dL — ABNORMAL LOW (ref 8.9–10.3)
Chloride: 96 mmol/L — ABNORMAL LOW (ref 101–111)
Creatinine, Ser: 8.08 mg/dL — ABNORMAL HIGH (ref 0.61–1.24)
GFR calc Af Amer: 9 mL/min — ABNORMAL LOW (ref >60)
GFR calc non Af Amer: 8 mL/min — ABNORMAL LOW (ref >60)
Glucose, Bld: 94 mg/dL (ref 65–99)
Phosphorus: 7.2 mg/dL — ABNORMAL HIGH (ref 2.5–4.6)
Potassium: 4.2 mmol/L (ref 3.5–5.1)
Sodium: 133 mmol/L — ABNORMAL LOW (ref 135–145)

## 2017-07-31 LAB — URINALYSIS, ROUTINE W REFLEX MICROSCOPIC
Bilirubin Urine: NEGATIVE
Glucose, UA: NEGATIVE mg/dL
Hgb urine dipstick: NEGATIVE
Ketones, ur: NEGATIVE mg/dL
Leukocytes, UA: NEGATIVE
Nitrite: NEGATIVE
Protein, ur: 30 mg/dL — AB
Specific Gravity, Urine: 1.006 (ref 1.005–1.030)
pH: 5 (ref 5.0–8.0)

## 2017-07-31 LAB — CBC
HCT: 25 % — ABNORMAL LOW (ref 39.0–52.0)
Hemoglobin: 8.4 g/dL — ABNORMAL LOW (ref 13.0–17.0)
MCH: 30.1 pg (ref 26.0–34.0)
MCHC: 33.6 g/dL (ref 30.0–36.0)
MCV: 89.6 fL (ref 78.0–100.0)
Platelets: 380 10*3/uL (ref 150–400)
RBC: 2.79 MIL/uL — ABNORMAL LOW (ref 4.22–5.81)
RDW: 13.8 % (ref 11.5–15.5)
WBC: 9.6 10*3/uL (ref 4.0–10.5)

## 2017-07-31 MED ORDER — LEVOFLOXACIN IN D5W 500 MG/100ML IV SOLN: 500.0000 mg | INTRAVENOUS | Status: DC

## 2017-07-31 MED ORDER — LEVOFLOXACIN IN D5W 750 MG/150ML IV SOLN
750.0000 mg | INTRAVENOUS | Status: AC
  Administered 2017-07-31: 750 mg via INTRAVENOUS
  Filled 2017-07-31 (×2): qty 150

## 2017-07-31 NOTE — Progress Notes (Signed)
[facility] Progress Note  16 Days Post-Op  Subjective: CC: fever Febrile to 101.6 this AM, now 99.2. No increased abdominal pain, nausea or vomiting. No diarrhea, patient reports stools are more brown. Denies urinary symptoms or SOB.   Objective: Vital signs in last 24 hours: Temp:  [98.8 F (37.1 C)-101.6 F (38.7 C)] 101.6 F (38.7 C) (05/26 7062) Pulse Rate:  [87-101] 101 (05/26 0451) Resp:  [17] 17 (05/25 1352) BP: (150-168)/(103-111) 168/111 (05/26 0451) SpO2:  [92 %-100 %] 92 % (05/26 0451) Last BM Date: 07/29/17  Intake/Output from previous day: 05/25 0701 - 05/26 0700 In: 240 [P.O.:240] Out: 2350 [Urine:2350] Intake/Output this shift: No intake/output data recorded.  PE: Gen: Alert, NAD, pleasant Card: Regular rate and rhythm, pedal pulses 2+ BL, extremity edema improving Pulm: Normal effort, clear to auscultation bilaterally Abd: Soft, non-tender, non-distended, bowel sounds present, no HSM,midline woundclean with good granulation tissue-blister noted at umbilicus; JP site with minimal SS drainage on dressing Skin: warm and dry, no rashes  Psych: A&Ox3    Lab Results:  Recent Labs    07/29/17 0406 07/30/17 0403  WBC 9.0 8.9  HGB 8.1* 8.3*  HCT 23.1* 24.0*  PLT 365 413*   BMET Recent Labs    07/30/17 0403 07/31/17 0433  NA 136 133*  K 4.1 4.2  CL 98* 96*  CO2 26 25  GLUCOSE 92 94  BUN 35* 48*  CREATININE 6.37* 8.08*  CALCIUM 8.2* 8.3*   PT/INR No results for input(s): LABPROT, INR in the last 72 hours. CMP     Component Value Date/Time   NA 133 (L) 07/31/2017 0433   K 4.2 07/31/2017 0433   CL 96 (L) 07/31/2017 0433   CO2 25 07/31/2017 0433   GLUCOSE 94 07/31/2017 0433   BUN 48 (H) 07/31/2017 0433   CREATININE 8.08 (H) 07/31/2017 0433   CALCIUM 8.3 (L) 07/31/2017 0433   PROT 5.3 (L) 07/22/2017 0802   ALBUMIN 2.5 (L) 07/31/2017 0433   AST 19 07/22/2017 0802   ALT 20 07/22/2017 0802   ALKPHOS 85 07/22/2017 0802   BILITOT 0.9 07/22/2017 0802   GFRNONAA 8 (L) 07/31/2017 0433   GFRAA 9 (L) 07/31/2017 0433   Lipase     Component Value Date/Time   LIPASE 329 (H) 07/22/2017 0802       Studies/Results: No results found.  Anti-infectives: Anti-infectives (From admission, onward)   Start     Dose/Rate Route Frequency Ordered Stop   07/26/17 1000  linezolid (ZYVOX) IVPB 600 mg  Status:  Discontinued     600 mg 300 mL/hr over 60 Minutes Intravenous Every 12 hours 07/26/17 0820 07/26/17 0820   07/16/17 1600  piperacillin-tazobactam (ZOSYN) IVPB 2.25 g  Status:  Discontinued     2.25 g 100 mL/hr over 30 Minutes Intravenous Every 8 hours 07/16/17 1213 07/29/17 1031   07/15/17 1445  vancomycin (VANCOCIN) IVPB 750 mg/150 ml premix  Status:  Discontinued     750 mg 150 mL/hr over 60 Minutes Intravenous To Surgery 07/15/17 1437 07/15/17 2041   07/14/17 2300  piperacillin-tazobactam (ZOSYN) IVPB 3.375 g  Status:  Discontinued     3.375 g 12.5 mL/hr over 240 Minutes Intravenous Every 8 hours 07/13/17 1930 07/14/17 0858   07/14/17 0900  piperacillin-tazobactam (ZOSYN) IVPB 3.375 g  Status:  Discontinued     3.375 g 12.5 mL/hr over 240 Minutes Intravenous Every 8 hours 07/14/17 0858 07/16/17 1213   07/14/17 0445  vancomycin (VANCOCIN) IVPB 750 mg/150  ml premix  Status:  Discontinued     750 mg 150 mL/hr over 60 Minutes Intravenous Every 8 hours 07/14/17 0431 07/16/17 0856   07/13/17 1900  piperacillin-tazobactam (ZOSYN) IVPB 3.375 g  Status:  Discontinued     3.375 g 12.5 mL/hr over 240 Minutes Intravenous Every 8 hours 07/13/17 1847 07/13/17 1930   07/13/17 1530  piperacillin-tazobactam (ZOSYN) IVPB 3.375 g     3.375 g 100 mL/hr over 30 Minutes Intravenous  Once 07/13/17 1515 07/13/17 1636       Assessment/Plan Prior h/o GI bleed with no known cause, workup unrevealing  Complexintra-abdominal abscess S/pex lapSBR and drainage of intra-abdominal abscess5/10 Dr. 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 ABL anemia-2U PRBC's 5/22.Hgb8.4 5/25, stable - stool character has improved, would not resume lovenox at this time Fever - UA negative, CXR pending, CBC pending - WBC 9 yesterday   ARF -appreciate nephrology's assistance -HD started on 5/20 -crup again today 8.08, BUN48. GFR 8 -UOP improving   Rash - improved  ID -zosyn5/8>>5/24 VTE -SCDs, no lovenox FEN -soft diet, Boost/protein shakes Foley -d/c 5/15, strict I&Os Follow up -Dr. Redmond Pulling  Plan -Fever workup pending. Currently patient is afebrile, will continue to monitor.      LOS: 18 days    Brigid Re , [facility] Surgery 07/31/2017, 8:09 AM Pager: [phone] Consults: [phone] Mon-Fri 7:00 am-4:30 pm Sat-Sun 7:00 am-11:30 am

## 2017-07-31 NOTE — Progress Notes (Signed)
ANTIBIOTIC CONSULT NOTE - INITIAL  Pharmacy Consult for Levaquin Indication: pneumonia  No Known Allergies  Patient Measurements: Height: 5\' 7"  (170.2 cm) Weight: 169 lb 12.1 oz (77 kg) IBW/kg (Calculated) : 66.1 Adjusted Body Weight:    Vital Signs: Temp: 99.2 F (37.3 C) (05/26 0817) Temp Source: Oral (05/26 0817) BP: 147/98 (05/26 0817) Pulse Rate: 89 (05/26 0817) Intake/Output from previous day: 05/25 0701 - 05/26 0700 In: 240 [P.O.:240] Out: 2350 [Urine:2350] Intake/Output from this shift: No intake/output data recorded.  Labs: Recent Labs    07/29/17 0406 07/30/17 0403 07/31/17 0433  WBC 9.0 8.9  --   HGB 8.1* 8.3*  --   PLT 365 413*  --   CREATININE 10.20* 6.37* 8.08*   Estimated Creatinine Clearance: 12 mL/min (A) (by C-G formula based on SCr of 8.08 mg/dL (H)). No results for input(s): VANCOTROUGH, VANCOPEAK, VANCORANDOM, GENTTROUGH, GENTPEAK, GENTRANDOM, TOBRATROUGH, TOBRAPEAK, TOBRARND, AMIKACINPEAK, AMIKACINTROU, AMIKACIN in the last 72 hours.   Microbiology:   Medical History: Past Medical History:  Diagnosis Date  . Anemia 07/2014  . Gastrointestinal hemorrhage with melena    [name] 07/26/2014 [facility])  . GERD (gastroesophageal reflux disease)   . Intra-abdominal abscess ([place])    [name] 07/13/2017   Assessment: ID: Complex large IAA s/p ex-lap and SBR with drainage of abscess - unknown source, suspected infected hematoma.  5/17 CT - pelvic fluid vs abscess >> JP drain - Tmax 101.6, currently 99.2, WBC 8.9, Scr 8.08. Start abx for HCAP  Zosyn 5/9 >>5/24 Levaquin 5/26>>  5/8 BCx - negative 5/10 surgical screen - MRSA positive  Goal of Therapy:  Eradication of infection  Plan:  Plan:  Levaquin 750mg  IV x 1 then 500mg  IV q 48hr (HD dosing)  Monalisa Bayless S. Alford Highland, PharmD, BCPS Clinical Staff Pharmacist Pager [phone]  Eilene Ghazi Stillinger 07/31/2017,11:28 AM

## 2017-07-31 NOTE — Progress Notes (Signed)
Patient's temp 102.8, prn tylenol administered. [facility] on call paged.

## 2017-07-31 NOTE — Progress Notes (Signed)
Patient complaining of chills temp 99.3. Oxy 10 mg administered at the request of the patient. Patient vomited x1- 212ml. PRN Phenergan administered, will continue to monitor.

## 2017-07-31 NOTE — Progress Notes (Signed)
[facility] Rounding Note  Background: 37 y.o. 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 Baseline creatinine 1.24 . Rec'd V/Z on adm, had IV contrast X2 on 5/9. Developed AKI. Temp cath placed 5/20 for initiation of HD for creatinine of 15, uremic sx. HD#1 5/20. #2 5/22 #3 5/24  Assessment/Plan:  1. AKI -  abdominal surgery/IV contrast/vanc. Bland urine. U/S good sized kidneys- echogenic - creatinine pre insult 1.24.  Vas cath placed by IR 5/20. HD #1 5/21, #2 5/22, #3 5/24. 2.4L UOP/24 hours clear improvement there.. Creatinine still rising in interdialytic interval 2. Trend renal function and UOP through the weekend  3. Will wait on labs tomorrow AM before deciding about Monday HD  2. S/p xlap for perf bowel/intraabd abscess - path + for GIST clear margins 3. GIST tumor 4. ABLA with dark tarry stools. No heparin with HD. 2U PRBC's  5/22. Aranesp 100 dosed 5/21. Hb stable since. 5. Metabolic acidosis - resolved 6. Hyperphosphatemia  - no action yet - have not wanted to introduce binder into tenuous GI situation. 7. Rash - pruritic, maculopapular. ? Etiology. Fading 8. HCAP - dense LLL infiltrate CXR accounting for fever. Dose adjusted levaquin to be added per notes (nothing on MAR yet).  Jamal Maes, MD [place] Pager 07/31/2017, 10:15 AM   Subjective:   Had temp cath IR 5/20, 1st HD 5/20, 2nd 5/22, 3rd 5/24 Transfused with HD 5/22  Making urine, creatinine still rising interdialytic interval Plans noted for addition levaquin for LLL infiltrate  Objective Vital signs in last 24 hours: Vitals:   07/31/17 0444 07/31/17 0451 07/31/17 0638 07/31/17 0817  BP:  (!) 168/111  (!) 147/98  Pulse:  (!) 101  89  Resp:    16  Temp: 100.2 F (37.9 C) 100.2 F (37.9 C) (!) 101.6 F (38.7 C) 99.2 F (37.3 C)  TempSrc: Oral Oral Oral Oral  SpO2:  92%  95%  Weight:       Height:       Weight change:   Intake/Output Summary (Last 24 hours) at 07/31/2017 1015 Last data filed at 07/31/2017 0645 Gross per 24 hour  Intake -  Output 1450 ml  Net -1450 ml   Physical Exam: VS as noted  2.3 L UOP/24 hours NAD No JVD Lungs clear S1S2 No S3 or rub Abd +BS Dressing in place  Pitting edema LE's resolved Fine maculopapular rash chest, shoulders, legs - continues to fade Temp cath (5/20)  R IJ dry dressing dry  Recent Labs  Lab 07/29/17 0406 07/30/17 0403 07/31/17 0433  NA 135 136 133*  K 3.7 4.1 4.2  CL 99* 98* 96*  CO2 23 26 25   GLUCOSE 96 92 94  BUN 60* 35* 48*  CREATININE 10.20* 6.37* 8.08*  CALCIUM 8.5* 8.2* 8.3*  PHOS 7.1* 5.4* 7.2*    Recent Labs  Lab 07/29/17 0406 07/30/17 0403 07/31/17 0433  ALBUMIN 2.3* 2.3* 2.5*     Recent Labs  Lab 07/26/17 0453 07/27/17 0407 07/28/17 0307 07/29/17 0406 07/30/17 0403  WBC 11.7* 10.5 8.9 9.0 8.9  NEUTROABS  --   --   --  6.2  --   HGB 7.9* 7.4* 8.4* 8.1* 8.3*  HCT 22.2* 20.6* 23.6* 23.1* 24.0*  MCV 88.4 86.6 86.8 88.5 89.2  PLT 513* 452* 429* 365 413*   Studies/Results: Ir Fluoro Guide Cv Line Right  Result  Date: 07/25/2017 INDICATION: ATN.  Nephrotoxicity. EXAM: TUNNELED DIALYSIS CATHETER PLACEMENT, ULTRASOUND GUIDANCE FOR VASCULAR ACCESS MEDICATIONS: None ANESTHESIA/SEDATION: None FLUOROSCOPY TIME:  Fluoroscopy Time:  minutes 30 seconds (2 mGy). COMPLICATIONS: None immediate. PROCEDURE: The right neck was prepped and draped in a sterile fashion. 1% lidocaine was utilized for local anesthesia. Under sonographic guidance, a micropuncture needle was inserted into the right jugular vein and removed over a 018 wire which was up sized to an Amplatz. Twelve [place] dilator followed by the 12 [place] dialysis catheter were inserted. It was flushed and sewn in place. FINDINGS: Images document a temporary right jugular dialysis catheter with its tip at the cavoatrial junction. IMPRESSION:  Successful right IJ vein temporary dialysis catheter with its tip at the cavoatrial junction. Electronically Signed   By: Marybelle Killings M.D.   On: 07/25/2017 09:53   Ir US Guide Vasc Access Right Dg Chest 2 View  Result Date: 07/31/2017 CLINICAL DATA:  Fever since last night.  Ex-smoker. EXAM: CHEST - 2 VIEW COMPARISON:  None. FINDINGS: Normal sized heart. Dense left lower lobe airspace opacity and small left pleural effusion. Clear right lung. Right central venous catheter tip in the upper right atrium. Normal appearing bones. IMPRESSION: 1. Dense left lower lobe pneumonia. 2. Small left pleural effusion compatible with a parapneumonic effusion. 3. Right central venous catheter tip in the upper right atrium, 2 cm inferior to the superior cavoatrial junction. Electronically Signed   By: Claudie Revering M.D.   On: 07/31/2017 09:29   Medications: Infusions: . sodium chloride      Scheduled Medications: . darbepoetin (ARANESP) injection - DIALYSIS  100 mcg Intravenous Q Tue-HD  . feeding supplement  1 Container Oral BID BM  . feeding supplement (NEPRO CARB STEADY)  237 mL Oral BID BM  . ferrous gluconate  324 mg Oral QPC supper  . methocarbamol  500 mg Oral TID  . multivitamin  1 tablet Oral QHS  . pantoprazole  40 mg Oral Daily

## 2017-07-31 NOTE — Progress Notes (Signed)
Pt's temp came down to 99.1 on my shift. Will pass to on-coming RN to retake and monitor.

## 2017-07-31 NOTE — Progress Notes (Signed)
Patient developed fever this morning, ordered tylenol given. Fever not responsive to tylenol. Patient denies pain or SOB. Regular Resp. Rate. Paged [facility] for further orders

## 2017-08-01 LAB — URINE CULTURE: Culture: NO GROWTH

## 2017-08-01 LAB — RENAL FUNCTION PANEL
Albumin: 2.4 g/dL — ABNORMAL LOW (ref 3.5–5.0)
Anion gap: 15 (ref 5–15)
BUN: 63 mg/dL — ABNORMAL HIGH (ref 6–20)
CO2: 23 mmol/L (ref 22–32)
Calcium: 8.5 mg/dL — ABNORMAL LOW (ref 8.9–10.3)
Chloride: 96 mmol/L — ABNORMAL LOW (ref 101–111)
Creatinine, Ser: 9.52 mg/dL — ABNORMAL HIGH (ref 0.61–1.24)
GFR calc Af Amer: 7 mL/min — ABNORMAL LOW (ref >60)
GFR calc non Af Amer: 6 mL/min — ABNORMAL LOW (ref >60)
Glucose, Bld: 103 mg/dL — ABNORMAL HIGH (ref 65–99)
Phosphorus: 8 mg/dL — ABNORMAL HIGH (ref 2.5–4.6)
Potassium: 4.7 mmol/L (ref 3.5–5.1)
Sodium: 134 mmol/L — ABNORMAL LOW (ref 135–145)

## 2017-08-01 MED ORDER — SODIUM CHLORIDE 0.9 % IV SOLN
1.0000 g | INTRAVENOUS | Status: DC
  Administered 2017-08-01 – 2017-08-04 (×4): 1 g via INTRAVENOUS
  Filled 2017-08-01 (×4): qty 1

## 2017-08-01 MED ORDER — SODIUM CHLORIDE 0.9 % IV SOLN
1.0000 g | INTRAVENOUS | Status: DC
  Filled 2017-08-01: qty 1

## 2017-08-01 MED ORDER — SODIUM CHLORIDE 0.9 % IV SOLN
1.0000 g | Freq: Once | INTRAVENOUS | Status: AC
  Administered 2017-08-01: 1 g via INTRAVENOUS
  Filled 2017-08-01: qty 1

## 2017-08-01 NOTE — Progress Notes (Signed)
Background: 37 y.o. 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 Baseline creatinine 1.24 . Rec'd V/Z on adm, had IV contrast X2 on 5/9. Developed AKI. Temp cath placed 5/20 for initiation of HD for creatinine of 15, uremic sx. HD#1 5/20. #2 5/22 #3 5/24  Assessment/Plan:  1. AKI -  abdominal surgery/IV contrast/vanc. Bland urine. U/S good sized kidneys- echogenic - creatinine pre insult 1.24.  Vas cath placed by IR 5/20. HD #1 5/21, #2 5/22, #3 5/24. 2.4L UOP/24 hours clear improvement there.. Creatinine still rising in interdialytic interval   Not uremic so will follow, but creat slope of rise discouraging for early recovery   1. S/p xlap for perf bowel/intraabd abscess - path + for GIST clear margins 2. GIST tumor 3. ABLA with dark tarry stools. No heparin with HD. 2U PRBC's  5/22. Aranesp 100 dosed 5/21. Hb stable since. 4. Metabolic acidosis - resolved 5. Hyperphosphatemia  - add calcium acetate. 6. HCAP - dense LLL infiltrate CXR accounting for fever. On maxipime   Subjective: Interval History: unenthusiastic UOP  Objective: Vital signs in last 24 hours: Temp:  [98.5 F (36.9 C)-102.8 F (39.3 C)] 100.1 F (37.8 C) (05/27 1253) Pulse Rate:  [80-101] 85 (05/27 1253) Resp:  [16-17] 16 (05/27 1253) BP: (133-161)/(89-100) 145/98 (05/27 1253) SpO2:  [93 %-100 %] 100 % (05/27 1253) Weight change:   Intake/Output from previous day: 05/26 0701 - 05/27 0700 In: 1172 [P.O.:1022; IV Piggyback:150] Out: 350 [Urine:150; Emesis/NG output:200] Intake/Output this shift: Total I/O In: 222 [P.O.:222] Out: 500 [Urine:500]  General appearance: alert and cooperative Chest wall: no tenderness, tr prelumbar edema Cardio: regular rate and rhythm, S1, S2 normal, no murmur, click, rub or gallop GI: soft, non-tender; bowel sounds normal; no masses,  no organomegaly Extremities: extremities  normal, atraumatic, no cyanosis or edema  Lab Results: Recent Labs    07/30/17 0403 07/31/17 1150  WBC 8.9 9.6  HGB 8.3* 8.4*  HCT 24.0* 25.0*  PLT 413* 380   BMET:  Recent Labs    07/31/17 0433 08/01/17 0428  NA 133* 134*  K 4.2 4.7  CL 96* 96*  CO2 25 23  GLUCOSE 94 103*  BUN 48* 63*  CREATININE 8.08* 9.52*  CALCIUM 8.3* 8.5*   No results for input(s): PTH in the last 72 hours. Iron Studies: No results for input(s): IRON, TIBC, TRANSFERRIN, FERRITIN in the last 72 hours. Studies/Results: Dg Chest 2 View  Result Date: 07/31/2017 CLINICAL DATA:  Fever since last night.  Ex-smoker. EXAM: CHEST - 2 VIEW COMPARISON:  None. FINDINGS: Normal sized heart. Dense left lower lobe airspace opacity and small left pleural effusion. Clear right lung. Right central venous catheter tip in the upper right atrium. Normal appearing bones. IMPRESSION: 1. Dense left lower lobe pneumonia. 2. Small left pleural effusion compatible with a parapneumonic effusion. 3. Right central venous catheter tip in the upper right atrium, 2 cm inferior to the superior cavoatrial junction. Electronically Signed   By: Claudie Revering M.D.   On: 07/31/2017 09:29    Scheduled: . darbepoetin (ARANESP) injection - DIALYSIS  100 mcg Intravenous Q Tue-HD  . feeding supplement  1 Container Oral BID BM  . feeding supplement (NEPRO CARB STEADY)  237 mL Oral BID BM  . ferrous gluconate  324 mg Oral QPC supper  . methocarbamol  500 mg Oral TID  . multivitamin  1 tablet Oral QHS  .  pantoprazole  40 mg Oral Daily    LOS: 19 days   Estanislado Emms 08/01/2017,2:52 PM

## 2017-08-01 NOTE — Progress Notes (Signed)
[facility] Progress Note  17 Days Post-Op  Subjective: CC: nausea with abx Patient had nausea and GI issues with IV levaquin overnight and would like to consider different abx. Have discussed with pharmacist. Denies increased abdominal pain.   Objective: Vital signs in last 24 hours: Temp:  [98.5 F (36.9 C)-102.8 F (39.3 C)] 99.4 F (37.4 C) (05/27 0741) Pulse Rate:  [80-101] 80 (05/27 0445) Resp:  [17-18] 17 (05/27 0445) BP: (133-161)/(89-100) 144/100 (05/27 0445) SpO2:  [93 %-100 %] 98 % (05/27 0445) Last BM Date: 07/31/17  Intake/Output from previous day: 05/26 0701 - 05/27 0700 In: 1172 [P.O.:1022; IV Piggyback:150] Out: 350 [Urine:150; Emesis/NG output:200] Intake/Output this shift: Total I/O In: -  Out: 500 [Urine:500]  PE: Gen: Alert, NAD, pleasant Card: Regular rate and rhythm, pedal pulses 2+ BL, extremity edema improving Pulm: Normal effort, clear to auscultation bilaterally Abd: Soft, non-tender, non-distended, bowel sounds present, no HSM,midline woundclean with good granulation tissue-blister noted at umbilicus Skin: warm and dry, no rashes  Psych: A&Ox3, seems more down today  Lab Results:  Recent Labs    07/30/17 0403 07/31/17 1150  WBC 8.9 9.6  HGB 8.3* 8.4*  HCT 24.0* 25.0*  PLT 413* 380   BMET Recent Labs    07/31/17 0433 08/01/17 0428  NA 133* 134*  K 4.2 4.7  CL 96* 96*  CO2 25 23  GLUCOSE 94 103*  BUN 48* 63*  CREATININE 8.08* 9.52*  CALCIUM 8.3* 8.5*   PT/INR No results for input(s): LABPROT, INR in the last 72 hours. CMP     Component Value Date/Time   NA 134 (L) 08/01/2017 0428   K 4.7 08/01/2017 0428   CL 96 (L) 08/01/2017 0428   CO2 23 08/01/2017 0428   GLUCOSE 103 (H) 08/01/2017 0428   BUN 63 (H) 08/01/2017 0428   CREATININE 9.52 (H) 08/01/2017 0428   CALCIUM 8.5 (L) 08/01/2017 0428   PROT 5.3 (L) 07/22/2017 0802   ALBUMIN 2.4 (L) 08/01/2017 0428   AST 19 07/22/2017 0802   ALT 20 07/22/2017  0802   ALKPHOS 85 07/22/2017 0802   BILITOT 0.9 07/22/2017 0802   GFRNONAA 6 (L) 08/01/2017 0428   GFRAA 7 (L) 08/01/2017 0428   Lipase     Component Value Date/Time   LIPASE 329 (H) 07/22/2017 0802       Studies/Results: Dg Chest 2 View  Result Date: 07/31/2017 CLINICAL DATA:  Fever since last night.  Ex-smoker. EXAM: CHEST - 2 VIEW COMPARISON:  None. FINDINGS: Normal sized heart. Dense left lower lobe airspace opacity and small left pleural effusion. Clear right lung. Right central venous catheter tip in the upper right atrium. Normal appearing bones. IMPRESSION: 1. Dense left lower lobe pneumonia. 2. Small left pleural effusion compatible with a parapneumonic effusion. 3. Right central venous catheter tip in the upper right atrium, 2 cm inferior to the superior cavoatrial junction. Electronically Signed   By: Claudie Revering M.D.   On: 07/31/2017 09:29    Anti-infectives: Anti-infectives (From admission, onward)   Start     Dose/Rate Route Frequency Ordered Stop   08/02/17 1200  levofloxacin (LEVAQUIN) IVPB 500 mg     500 mg 100 mL/hr over 60 Minutes Intravenous Every 48 hours 07/31/17 1125     07/31/17 1130  levofloxacin (LEVAQUIN) IVPB 750 mg     750 mg 100 mL/hr over 90 Minutes Intravenous STAT 07/31/17 1125 07/31/17 1406   07/26/17 1000  linezolid (ZYVOX) IVPB 600 mg  Status:  Discontinued     600 mg 300 mL/hr over 60 Minutes Intravenous Every 12 hours 07/26/17 0820 07/26/17 0820   07/16/17 1600  piperacillin-tazobactam (ZOSYN) IVPB 2.25 g  Status:  Discontinued     2.25 g 100 mL/hr over 30 Minutes Intravenous Every 8 hours 07/16/17 1213 07/29/17 1031   07/15/17 1445  vancomycin (VANCOCIN) IVPB 750 mg/150 ml premix  Status:  Discontinued     750 mg 150 mL/hr over 60 Minutes Intravenous To Surgery 07/15/17 1437 07/15/17 2041   07/14/17 2300  piperacillin-tazobactam (ZOSYN) IVPB 3.375 g  Status:  Discontinued     3.375 g 12.5 mL/hr over 240 Minutes Intravenous Every 8 hours  07/13/17 1930 07/14/17 0858   07/14/17 0900  piperacillin-tazobactam (ZOSYN) IVPB 3.375 g  Status:  Discontinued     3.375 g 12.5 mL/hr over 240 Minutes Intravenous Every 8 hours 07/14/17 0858 07/16/17 1213   07/14/17 0445  vancomycin (VANCOCIN) IVPB 750 mg/150 ml premix  Status:  Discontinued     750 mg 150 mL/hr over 60 Minutes Intravenous Every 8 hours 07/14/17 0431 07/16/17 0856   07/13/17 1900  piperacillin-tazobactam (ZOSYN) IVPB 3.375 g  Status:  Discontinued     3.375 g 12.5 mL/hr over 240 Minutes Intravenous Every 8 hours 07/13/17 1847 07/13/17 1930   07/13/17 1530  piperacillin-tazobactam (ZOSYN) IVPB 3.375 g     3.375 g 100 mL/hr over 30 Minutes Intravenous  Once 07/13/17 1515 07/13/17 1636       Assessment/Plan Prior h/o GI bleed with no known cause, workup unrevealing  Complexintra-abdominal abscess S/pex lapSBR and drainage of intra-abdominal abscess5/10 Dr. 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 ABL anemia-2U PRBC's 5/22.Hgb8.4 5/26, stable - stool character has improved HCAP - consulted with pharmacy to consider changing abx - starting cefepime  ARF -appreciate nephrology's assistance -HD started on 5/20 -crup again today 9.52, BUN63. GFR 6 -UOP improving    ID -zosyn5/8>>5/24; levaquin 5/26; cefepime 5/27>> VTE -SCDs FEN -soft diet, Boost/protein shakes Foley -d/c 5/15, strict I&Os Follow up -Dr. Redmond Pulling  Plan -change abx. Dialysis per nephrology    LOS: 19 days     Brigid Re , [facility] 08/01/2017, 8:25 AM Pager: [phone] Consults: [phone] Mon-Fri 7:00 am-4:30 pm Sat-Sun 7:00 am-11:30 am

## 2017-08-01 NOTE — Progress Notes (Signed)
Pharmacy Antibiotic Note  Joe Morris is a 37 y.o. male admitted on 07/13/2017 with abdominal abscesses.  Discussed patient with surgery, currently on Levaquin for HCAP. Pharmacy has been consulted for cefepime dosing.  Remains febrile at 102.8, WBC wnl. Has AKI with SCr up to 9.5.  Plan: Stop Levaquin Start cefepime 1g IV Q24h Monitor clinical picture, renal function F/U C&S, abx deescalation / LOT  May need to add MRSA coverage if does not improve   Height: 5\' 7"  (170.2 cm) Weight: 169 lb 12.1 oz (77 kg) IBW/kg (Calculated) : 66.1  Temp (24hrs), Avg:100.1 F (37.8 C), Min:98.5 F (36.9 C), Max:102.8 F (39.3 C)  Recent Labs  Lab 07/27/17 0407 07/28/17 0307 07/29/17 0406 07/30/17 0403 07/31/17 0433 07/31/17 1150 08/01/17 0428  WBC 10.5 8.9 9.0 8.9  --  9.6  --   CREATININE 12.31* 8.72* 10.20* 6.37* 8.08*  --  9.52*    Estimated Creatinine Clearance: 10.2 mL/min (A) (by C-G formula based on SCr of 9.52 mg/dL (H)).    No Known Allergies   Thank you for allowing pharmacy to be a part of this patient's care.  Reginia Naas 08/01/2017 9:22 AM

## 2017-08-02 LAB — CBC
HCT: 23 % — ABNORMAL LOW (ref 39.0–52.0)
Hemoglobin: 7.9 g/dL — ABNORMAL LOW (ref 13.0–17.0)
MCH: 31 pg (ref 26.0–34.0)
MCHC: 34.3 g/dL (ref 30.0–36.0)
MCV: 90.2 fL (ref 78.0–100.0)
Platelets: 270 10*3/uL (ref 150–400)
RBC: 2.55 MIL/uL — ABNORMAL LOW (ref 4.22–5.81)
RDW: 13.4 % (ref 11.5–15.5)
WBC: 8.3 10*3/uL (ref 4.0–10.5)

## 2017-08-02 LAB — RENAL FUNCTION PANEL
Albumin: 2.3 g/dL — ABNORMAL LOW (ref 3.5–5.0)
Anion gap: 17 — ABNORMAL HIGH (ref 5–15)
BUN: 78 mg/dL — ABNORMAL HIGH (ref 6–20)
CO2: 20 mmol/L — ABNORMAL LOW (ref 22–32)
Calcium: 8.6 mg/dL — ABNORMAL LOW (ref 8.9–10.3)
Chloride: 96 mmol/L — ABNORMAL LOW (ref 101–111)
Creatinine, Ser: 11.17 mg/dL — ABNORMAL HIGH (ref 0.61–1.24)
GFR calc Af Amer: 6 mL/min — ABNORMAL LOW (ref >60)
GFR calc non Af Amer: 5 mL/min — ABNORMAL LOW (ref >60)
Glucose, Bld: 102 mg/dL — ABNORMAL HIGH (ref 65–99)
Phosphorus: 10.2 mg/dL — ABNORMAL HIGH (ref 2.5–4.6)
Potassium: 4.6 mmol/L (ref 3.5–5.1)
Sodium: 133 mmol/L — ABNORMAL LOW (ref 135–145)

## 2017-08-02 LAB — BASIC METABOLIC PANEL
Anion gap: 17 — ABNORMAL HIGH (ref 5–15)
BUN: 78 mg/dL — ABNORMAL HIGH (ref 6–20)
CO2: 22 mmol/L (ref 22–32)
Calcium: 8.7 mg/dL — ABNORMAL LOW (ref 8.9–10.3)
Chloride: 96 mmol/L — ABNORMAL LOW (ref 101–111)
Creatinine, Ser: 11.08 mg/dL — ABNORMAL HIGH (ref 0.61–1.24)
GFR calc Af Amer: 6 mL/min — ABNORMAL LOW (ref >60)
GFR calc non Af Amer: 5 mL/min — ABNORMAL LOW (ref >60)
Glucose, Bld: 107 mg/dL — ABNORMAL HIGH (ref 65–99)
Potassium: 4.6 mmol/L (ref 3.5–5.1)
Sodium: 135 mmol/L (ref 135–145)

## 2017-08-02 MED ORDER — SODIUM CHLORIDE 0.9 % IV SOLN
100.0000 mL | INTRAVENOUS | Status: DC | PRN
Start: 2017-08-02 — End: 2017-08-08

## 2017-08-02 MED ORDER — LIDOCAINE HCL (PF) 1 % IJ SOLN: 5.0000 mL | INTRAMUSCULAR | Status: DC | PRN

## 2017-08-02 MED ORDER — SODIUM CHLORIDE 0.9 % IV SOLN: 100.0000 mL | INTRAVENOUS | Status: DC | PRN

## 2017-08-02 MED ORDER — HEPARIN SODIUM (PORCINE) 1000 UNIT/ML DIALYSIS
20.0000 [IU]/kg | INTRAMUSCULAR | Status: DC | PRN
  Filled 2017-08-02: qty 2

## 2017-08-02 MED ORDER — ALTEPLASE 2 MG IJ SOLR: 2.0000 mg | Freq: Once | INTRAMUSCULAR | Status: DC | PRN

## 2017-08-02 MED ORDER — HYDRALAZINE HCL 20 MG/ML IJ SOLN: 10.0000 mg | Freq: Four times a day (QID) | INTRAMUSCULAR | Status: DC | PRN

## 2017-08-02 MED ORDER — PENTAFLUOROPROP-TETRAFLUOROETH EX AERO: 1.0000 "application " | INHALATION_SPRAY | CUTANEOUS | Status: DC | PRN

## 2017-08-02 MED ORDER — CHLORHEXIDINE GLUCONATE CLOTH 2 % EX PADS
6.0000 | MEDICATED_PAD | Freq: Every day | CUTANEOUS | Status: DC
  Administered 2017-08-06: 6 via TOPICAL

## 2017-08-02 MED ORDER — LIDOCAINE-PRILOCAINE 2.5-2.5 % EX CREA
1.0000 "application " | TOPICAL_CREAM | CUTANEOUS | Status: DC | PRN
  Filled 2017-08-02: qty 5

## 2017-08-02 MED ORDER — HEPARIN SODIUM (PORCINE) 1000 UNIT/ML DIALYSIS
1000.0000 [IU] | INTRAMUSCULAR | Status: DC | PRN
  Filled 2017-08-02: qty 1

## 2017-08-02 MED ORDER — CALCIUM ACETATE (PHOS BINDER) 667 MG PO CAPS
1334.0000 mg | ORAL_CAPSULE | Freq: Three times a day (TID) | ORAL | Status: DC
  Administered 2017-08-02 – 2017-08-08 (×17): 1334 mg via ORAL
  Filled 2017-08-02 (×19): qty 2

## 2017-08-02 MED ORDER — DARBEPOETIN ALFA 100 MCG/0.5ML IJ SOSY
PREFILLED_SYRINGE | INTRAMUSCULAR | Status: AC
  Administered 2017-08-02: 100 ug via INTRAVENOUS
  Filled 2017-08-02: qty 0.5

## 2017-08-02 NOTE — Care Management Note (Addendum)
Case Management Note  Patient Details  Name: JAQUAVIAN FIRKUS MRN: [id number] Date of Birth: 11-Sep-1980  Subjective/Objective:                    Action/Plan:  "Please ask CM to explore with insurance OP HD for Acute kidney failure". Hemodialysis will be run through patients insurance. Unable to determine co pay at current time.   Left message with NCM for 6M Kim, awaiting call back. Kim returned call, once patient applies for medicare/medicaid there is no co pay .  Called HD Unit, spoke with Caryl Pina , she will call NCM back. Freda Munro with hemodialysis has requested cost of HD with finance . Once she has answer she will call NCM back.  Expected Discharge Date:                  Expected Discharge Plan:  [place]  In-House Referral:     Discharge planning Services  CM Consult  Post Acute Care Choice:    Choice offered to:     DME Arranged:    DME Agency:     HH Arranged:    [place] Agency:     Status of Service:  In process, will continue to follow  If discussed at Long Length of Stay Meetings, dates discussed:    Additional Comments:  Marilu Favre, RN 08/02/2017, 3:40 PM

## 2017-08-02 NOTE — Progress Notes (Signed)
[facility] Progress Note  18 Days Post-Op  Subjective: CC: nausea and decreased appetite Patient reports fatigue, nausea and decreased appetite in the last 48 hrs. Denies abdominal pain. No BM yesterday but passing flatus. Denies cough, SOB, palpitations. Frustrated with progress, wants to go home.  Tmax 102 overnight.    Objective: Vital signs in last 24 hours: Temp:  [98.3 F (36.8 C)-102 F (38.9 C)] 98.3 F (36.8 C) (05/28 0517) Pulse Rate:  [84-105] 87 (05/28 0517) Resp:  [16-18] 18 (05/28 0517) BP: (138-158)/(93-114) 139/93 (05/28 0517) SpO2:  [97 %-100 %] 98 % (05/28 0517) Last BM Date: 07/31/17  Intake/Output from previous day: 05/27 0701 - 05/28 0700 In: 542 [P.O.:342; IV Piggyback:200] Out: 900 [Urine:900] Intake/Output this shift: No intake/output data recorded.  PE: Gen: Alert, NAD, pleasant Card: Regular rate and rhythm, pedal pulses 2+ BL, extremity edema improving Pulm: Normal effort, clear to auscultation bilaterally Abd: Soft, non-tender, non-distended, bowel sounds present, no HSM,midline woundclean with good granulation tissue-blister noted at umbilicus Skin: warm and dry, no rashes  Psych: A&Ox3, seems more down today   Lab Results:  Recent Labs    07/31/17 1150 08/02/17 0434  WBC 9.6 8.3  HGB 8.4* 7.9*  HCT 25.0* 23.0*  PLT 380 270   BMET Recent Labs    08/01/17 0428 08/02/17 0434  NA 134* 135  133*  K 4.7 4.6  4.6  CL 96* 96*  96*  CO2 23 22  20*  GLUCOSE 103* 107*  102*  BUN 63* 78*  78*  CREATININE 9.52* 11.08*  11.17*  CALCIUM 8.5* 8.7*  8.6*   PT/INR No results for input(s): LABPROT, INR in the last 72 hours. CMP     Component Value Date/Time   NA 133 (L) 08/02/2017 0434   NA 135 08/02/2017 0434   K 4.6 08/02/2017 0434   K 4.6 08/02/2017 0434   CL 96 (L) 08/02/2017 0434   CL 96 (L) 08/02/2017 0434   CO2 20 (L) 08/02/2017 0434   CO2 22 08/02/2017 0434   GLUCOSE 102 (H) 08/02/2017 0434   GLUCOSE 107 (H) 08/02/2017 0434   BUN 78 (H) 08/02/2017 0434   BUN 78 (H) 08/02/2017 0434   CREATININE 11.17 (H) 08/02/2017 0434   CREATININE 11.08 (H) 08/02/2017 0434   CALCIUM 8.6 (L) 08/02/2017 0434   CALCIUM 8.7 (L) 08/02/2017 0434   PROT 5.3 (L) 07/22/2017 0802   ALBUMIN 2.3 (L) 08/02/2017 0434   AST 19 07/22/2017 0802   ALT 20 07/22/2017 0802   ALKPHOS 85 07/22/2017 0802   BILITOT 0.9 07/22/2017 0802   GFRNONAA 5 (L) 08/02/2017 0434   GFRNONAA 5 (L) 08/02/2017 0434   GFRAA 6 (L) 08/02/2017 0434   GFRAA 6 (L) 08/02/2017 0434   Lipase     Component Value Date/Time   LIPASE 329 (H) 07/22/2017 0802       Studies/Results: Dg Chest 2 View  Result Date: 07/31/2017 CLINICAL DATA:  Fever since last night.  Ex-smoker. EXAM: CHEST - 2 VIEW COMPARISON:  None. FINDINGS: Normal sized heart. Dense left lower lobe airspace opacity and small left pleural effusion. Clear right lung. Right central venous catheter tip in the upper right atrium. Normal appearing bones. IMPRESSION: 1. Dense left lower lobe pneumonia. 2. Small left pleural effusion compatible with a parapneumonic effusion. 3. Right central venous catheter tip in the upper right atrium, 2 cm inferior to the superior cavoatrial junction. Electronically Signed   By: Percell Locus.D.  On: 07/31/2017 09:29    Anti-infectives: Anti-infectives (From admission, onward)   Start     Dose/Rate Route Frequency Ordered Stop   08/02/17 1200  levofloxacin (LEVAQUIN) IVPB 500 mg  Status:  Discontinued     500 mg 100 mL/hr over 60 Minutes Intravenous Every 48 hours 07/31/17 1125 08/01/17 0921   08/01/17 2000  ceFEPIme (MAXIPIME) 1 g in sodium chloride 0.9 % 100 mL IVPB     1 g 200 mL/hr over 30 Minutes Intravenous Every 24 hours 08/01/17 0927     08/01/17 1000  ceFEPIme (MAXIPIME) 1 g in sodium chloride 0.9 % 100 mL IVPB  Status:  Discontinued     1 g 200 mL/hr over 30 Minutes Intravenous Every 24 hours 08/01/17 0921 08/01/17 0927    08/01/17 1000  ceFEPIme (MAXIPIME) 1 g in sodium chloride 0.9 % 100 mL IVPB     1 g 200 mL/hr over 30 Minutes Intravenous  Once 08/01/17 0927 08/01/17 1117   07/31/17 1130  levofloxacin (LEVAQUIN) IVPB 750 mg     750 mg 100 mL/hr over 90 Minutes Intravenous STAT 07/31/17 1125 07/31/17 1406   07/26/17 1000  linezolid (ZYVOX) IVPB 600 mg  Status:  Discontinued     600 mg 300 mL/hr over 60 Minutes Intravenous Every 12 hours 07/26/17 0820 07/26/17 0820   07/16/17 1600  piperacillin-tazobactam (ZOSYN) IVPB 2.25 g  Status:  Discontinued     2.25 g 100 mL/hr over 30 Minutes Intravenous Every 8 hours 07/16/17 1213 07/29/17 1031   07/15/17 1445  vancomycin (VANCOCIN) IVPB 750 mg/150 ml premix  Status:  Discontinued     750 mg 150 mL/hr over 60 Minutes Intravenous To Surgery 07/15/17 1437 07/15/17 2041   07/14/17 2300  piperacillin-tazobactam (ZOSYN) IVPB 3.375 g  Status:  Discontinued     3.375 g 12.5 mL/hr over 240 Minutes Intravenous Every 8 hours 07/13/17 1930 07/14/17 0858   07/14/17 0900  piperacillin-tazobactam (ZOSYN) IVPB 3.375 g  Status:  Discontinued     3.375 g 12.5 mL/hr over 240 Minutes Intravenous Every 8 hours 07/14/17 0858 07/16/17 1213   07/14/17 0445  vancomycin (VANCOCIN) IVPB 750 mg/150 ml premix  Status:  Discontinued     750 mg 150 mL/hr over 60 Minutes Intravenous Every 8 hours 07/14/17 0431 07/16/17 0856   07/13/17 1900  piperacillin-tazobactam (ZOSYN) IVPB 3.375 g  Status:  Discontinued     3.375 g 12.5 mL/hr over 240 Minutes Intravenous Every 8 hours 07/13/17 1847 07/13/17 1930   07/13/17 1530  piperacillin-tazobactam (ZOSYN) IVPB 3.375 g     3.375 g 100 mL/hr over 30 Minutes Intravenous  Once 07/13/17 1515 07/13/17 1636       Assessment/Plan Prior h/o GI bleed with no known cause, workup unrevealing  Complexintra-abdominal abscess S/pex lapSBR and drainage of intra-abdominal abscess5/10 Dr. Redmond Pulling - POD18 - path:GASTROINTESTINAL STROMAL TUMOR (GIST),  SPANNING 1.7 CM; TRANSMURAL DEFECT;INFLAMED OMENTUM;THE SURGICAL RESECTION MARGINS ARE NEGATIVE FOR TUMOR - VACd/c 5/17, now W2D dressing changes -toleratingsoft dietand having bowel function -JPdrainremoved 5/25 - CT 5/17 showedRound intermediate density collection in the deep RIGHT pelvis, abscess vs normal postop fluid;No upper abdomen complication following abscess/gastrointestinal stromal tumor resection from small bowel; no leak 2. ABL anemia-2U PRBC's 5/22.Hgb7.9 - recheck in AM tomorrow, if stable may be able to start SQ heparin  HCAP - WBC 8.3, Tmax 102, continue IV abx and check CXR tomorrow AM  ARF -appreciate nephrology's assistance -HD started on 5/20 -crup again today 11, BUN78. RUE4 -  UOP 900 in 24 h   ID -zosyn5/8>>5/24; levaquin 5/26; cefepime 5/27>> VTE -SCDs FEN -soft diet, Boost/protein shakes Foley -d/c 5/15, strict I&Os Follow up -Dr. Redmond Pulling  Plan -continue abx. Dialysis per nephrology. Can go to once daily dressing changes for abdominal wound. Repeat CXR in AM.     LOS: 20 days    Brigid Re , [facility] 08/02/2017, 7:24 AM Pager: [phone] Consults: [phone] Mon-Fri 7:00 am-4:30 pm Sat-Sun 7:00 am-11:30 am

## 2017-08-02 NOTE — Progress Notes (Signed)
Background: 37 y.o. 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 Baseline creatinine 1.24 . Rec'd V/Z on adm, had IV contrast X2 on 5/9. Developed AKI. Temp cath placed 5/20 for initiation of HD for creatinine of 15, uremic sx. HD#1 5/20. #2 5/22 #3 5/24  Assessment/Plan:  1. AKI - abdominal surgery/IV contrast/vanc. Bland urine. U/S good sized kidneys- echogenic - creatinine pre insult 1.24. Vas cath placed by IR 5/20. HD #1 5/21, #2 5/22, #3 5/24. 2.4L UOP/24 hours clear improvement there..Creatinine still rising in interdialytic interval        Creat slope of rise discouraging for early recovery .  Will do dialysis today and ask CM about OP HD.  1. S/p xlap for perf bowel/intraabd abscess - path + for GIST clear margins 2. GIST tumor 3. ABLA with dark tarry stools.. 2U PRBC's 5/22. Aranesp 100 dosed 5/21.  4. Hyperphosphatemia - add calcium acetate. 5. HCAP - dense LLL infiltrate CXR accounting for fever. On maxipime   Subjective: Interval History: Does not feel well  Objective: Vital signs in last 24 hours: Temp:  [98.3 F (36.8 C)-102 F (38.9 C)] 98.3 F (36.8 C) (05/28 0517) Pulse Rate:  [84-105] 87 (05/28 0517) Resp:  [16-18] 18 (05/28 0517) BP: (138-158)/(93-114) 139/93 (05/28 0517) SpO2:  [97 %-100 %] 98 % (05/28 0517) Weight change:   Intake/Output from previous day: 05/27 0701 - 05/28 0700 In: 542 [P.O.:342; IV Piggyback:200] Out: 900 [Urine:900] Intake/Output this shift: No intake/output data recorded.  General appearance: alert and cooperative Resp: clear to auscultation bilaterally Chest wall: no tenderness Cardio: regular rate and rhythm, S1, S2 normal, no murmur, click, rub or gallop Extremities: edema tr  Lab Results: Recent Labs    07/31/17 1150 08/02/17 0434  WBC 9.6 8.3  HGB 8.4* 7.9*  HCT 25.0* 23.0*  PLT 380 270   BMET:  Recent Labs   08/01/17 0428 08/02/17 0434  NA 134* 135  133*  K 4.7 4.6  4.6  CL 96* 96*  96*  CO2 23 22  20*  GLUCOSE 103* 107*  102*  BUN 63* 78*  78*  CREATININE 9.52* 11.08*  11.17*  CALCIUM 8.5* 8.7*  8.6*   No results for input(s): PTH in the last 72 hours. Iron Studies: No results for input(s): IRON, TIBC, TRANSFERRIN, FERRITIN in the last 72 hours. Studies/Results: No results found.  Scheduled: . Chlorhexidine Gluconate Cloth  6 each Topical Q0600  . darbepoetin (ARANESP) injection - DIALYSIS  100 mcg Intravenous Q Tue-HD  . feeding supplement  1 Container Oral BID BM  . feeding supplement (NEPRO CARB STEADY)  237 mL Oral BID BM  . ferrous gluconate  324 mg Oral QPC supper  . methocarbamol  500 mg Oral TID  . multivitamin  1 tablet Oral QHS  . pantoprazole  40 mg Oral Daily     LOS: 20 days   Estanislado Emms 08/02/2017,9:23 AM

## 2017-08-03 ENCOUNTER — Inpatient Hospital Stay (HOSPITAL_COMMUNITY): Payer: 59

## 2017-08-03 LAB — RENAL FUNCTION PANEL
Albumin: 2.3 g/dL — ABNORMAL LOW (ref 3.5–5.0)
Anion gap: 13 (ref 5–15)
BUN: 52 mg/dL — ABNORMAL HIGH (ref 6–20)
CO2: 26 mmol/L (ref 22–32)
Calcium: 8.5 mg/dL — ABNORMAL LOW (ref 8.9–10.3)
Chloride: 97 mmol/L — ABNORMAL LOW (ref 101–111)
Creatinine, Ser: 8.05 mg/dL — ABNORMAL HIGH (ref 0.61–1.24)
GFR calc Af Amer: 9 mL/min — ABNORMAL LOW (ref >60)
GFR calc non Af Amer: 8 mL/min — ABNORMAL LOW (ref >60)
Glucose, Bld: 102 mg/dL — ABNORMAL HIGH (ref 65–99)
Phosphorus: 5.7 mg/dL — ABNORMAL HIGH (ref 2.5–4.6)
Potassium: 4.2 mmol/L (ref 3.5–5.1)
Sodium: 136 mmol/L (ref 135–145)

## 2017-08-03 LAB — CBC
HCT: 23.9 % — ABNORMAL LOW (ref 39.0–52.0)
Hemoglobin: 8.2 g/dL — ABNORMAL LOW (ref 13.0–17.0)
MCH: 30.5 pg (ref 26.0–34.0)
MCHC: 34.3 g/dL (ref 30.0–36.0)
MCV: 88.8 fL (ref 78.0–100.0)
Platelets: 325 10*3/uL (ref 150–400)
RBC: 2.69 MIL/uL — ABNORMAL LOW (ref 4.22–5.81)
RDW: 13.2 % (ref 11.5–15.5)
WBC: 10.4 10*3/uL (ref 4.0–10.5)

## 2017-08-03 LAB — IRON AND TIBC
Iron: 26 ug/dL — ABNORMAL LOW (ref 45–182)
Saturation Ratios: 11 % — ABNORMAL LOW (ref 17.9–39.5)
TIBC: 228 ug/dL — ABNORMAL LOW (ref 250–450)
UIBC: 202 ug/dL

## 2017-08-03 LAB — FERRITIN: Ferritin: 249 ng/mL (ref 24–336)

## 2017-08-03 MED ORDER — HEPARIN SODIUM (PORCINE) 5000 UNIT/ML IJ SOLN
5000.0000 [IU] | Freq: Three times a day (TID) | INTRAMUSCULAR | Status: DC
  Administered 2017-08-03 – 2017-08-08 (×14): 5000 [IU] via SUBCUTANEOUS
  Filled 2017-08-03 (×14): qty 1

## 2017-08-03 NOTE — Progress Notes (Signed)
Background: 37 y.o. 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 Baseline creatinine 1.24 . Rec'd V/Z on adm, had IV contrast X2 on 5/9. Developed AKI. Temp cath placed 5/20 for initiation of HD for creatinine of 15, uremic sx. HD#1 5/20. #2 5/22 #3 5/24  Assessment/Plan:  1. AKI - abdominal surgery/IV contrast/vanc. Bland urine. U/S good sized kidneys- echogenic - creatinine pre insult 1.24. Vas cath placed by IR 5/20. HD #1 5/21, #2 5/22, #3 5/24. 2.4L UOP/24 hours clear improvement there..Creatinine still rising in interdialytic interval Creat slope of rise discouraging for early recovery.  PRN HD>  Await insurance status for OP HD. Has some vol on board but asymptomatic.  1. S/p xlap for perf bowel/intraabd abscess - path + for GIST clear margins 2. GIST tumor 3. ABLA with dark tarry stools.. 2U PRBC's 5/22. Aranesp 100 dosed 5/21.  4. Hyperphosphatemia -add calcium acetate. 5. HCAP - dense LLL infiltrate CXR accounting for fever.On Ab   Subjective: Interval History: Better after HD, N&V ? antibiotics  Objective: Vital signs in last 24 hours: Temp:  [98.2 F (36.8 C)-99.5 F (37.5 C)] 98.8 F (37.1 C) (05/29 0505) Pulse Rate:  [75-88] 88 (05/29 0505) Resp:  [15-18] 17 (05/29 0505) BP: (137-165)/(56-103) 139/97 (05/29 0505) SpO2:  [97 %-100 %] 97 % (05/29 0505) Weight:  [78.5 kg (173 lb 1 oz)] 78.5 kg (173 lb 1 oz) (05/28 1347) Weight change:   Intake/Output from previous day: 05/28 0701 - 05/29 0700 In: 520 [P.O.:420; IV Piggyback:100] Out: 1550 [Urine:1550] Intake/Output this shift: Total I/O In: 240 [P.O.:240] Out: -   General appearance: alert and cooperative Resp: diminished breath sounds base -bibas Chest wall: presacral Cardio: no rub Extremities: tr to 1+  Lab Results: Recent Labs    08/02/17 0434 08/03/17 0357  WBC 8.3 10.4  HGB 7.9* 8.2*   HCT 23.0* 23.9*  PLT 270 325   BMET:  Recent Labs    08/02/17 0434 08/03/17 0357  NA 135  133* 136  K 4.6  4.6 4.2  CL 96*  96* 97*  CO2 22  20* 26  GLUCOSE 107*  102* 102*  BUN 78*  78* 52*  CREATININE 11.08*  11.17* 8.05*  CALCIUM 8.7*  8.6* 8.5*   No results for input(s): PTH in the last 72 hours. Iron Studies: No results for input(s): IRON, TIBC, TRANSFERRIN, FERRITIN in the last 72 hours. Studies/Results: Dg Chest Port 1 View  Result Date: 08/03/2017 CLINICAL DATA:  Pneumonia/fever. EXAM: PORTABLE CHEST 1 VIEW COMPARISON:  07/31/2017 and prior radiographs FINDINGS: A RIGHT central venous catheter with tip overlying the SUPERIOR cavoatrial junction again noted. LEFT LOWER lung consolidation/atelectasis and RIGHT basilar atelectasis noted. Small bilateral pleural effusions may have slightly decreased. There is no evidence of pneumothorax. Cardiomediastinal silhouette is unchanged. IMPRESSION: Question slight decrease in small bilateral pleural effusions. Otherwise unchanged appearance of the chest with continued LEFT LOWER lung consolidation/atelectasis and RIGHT basilar atelectasis. Electronically Signed   By: Margarette Canada M.D.   On: 08/03/2017 08:56    Scheduled: . calcium acetate  1,334 mg Oral TID WC  . Chlorhexidine Gluconate Cloth  6 each Topical Q0600  . darbepoetin (ARANESP) injection - DIALYSIS  100 mcg Intravenous Q Tue-HD  . feeding supplement  1 Container Oral BID BM  . feeding supplement (NEPRO CARB STEADY)  237 mL Oral BID BM  . ferrous gluconate  324 mg  Oral QPC supper  . heparin injection (subcutaneous)  5,000 Units Subcutaneous Q8H  . methocarbamol  500 mg Oral TID  . multivitamin  1 tablet Oral QHS  . pantoprazole  40 mg Oral Daily      LOS: 21 days   Estanislado Emms 08/03/2017,11:20 AM

## 2017-08-03 NOTE — Progress Notes (Signed)
[facility] Progress Note  19 Days Post-Op  Subjective: CC: nausea Patient with another episode of nausea and vomiting overnight, reports he is not nauseated currently. Having bowel function, no black or bloody stools. Some cramping in abdomen overnight, attributes this to vomiting. Denies cough or SOB. Afebrile for last 24h.   Objective: Vital signs in last 24 hours: Temp:  [97 F (36.1 C)-99.5 F (37.5 C)] 98.8 F (37.1 C) (05/29 0505) Pulse Rate:  [75-88] 88 (05/29 0505) Resp:  [15-18] 17 (05/29 0505) BP: (137-165)/(56-103) 139/97 (05/29 0505) SpO2:  [97 %-100 %] 97 % (05/29 0505) Weight:  [78.5 kg (173 lb 1 oz)] 78.5 kg (173 lb 1 oz) (05/28 1347) Last BM Date: 08/02/17  Intake/Output from previous day: 05/28 0701 - 05/29 0700 In: 520 [P.O.:420; IV Piggyback:100] Out: 1550 [Urine:1550] Intake/Output this shift: No intake/output data recorded.  PE: Gen: Alert, NAD, pleasant Card: Regular rate and rhythm, pedal pulses 2+ BL, extremity edema improving Pulm: Normal effort, clear to auscultation bilaterally Abd: Soft, non-tender, non-distended, bowel sounds present, no HSM,midline woundclean with good granulation tissue-blister noted at umbilicus Skin: warm and dry, no rashes Psych: A&Ox3    Lab Results:  Recent Labs    08/02/17 0434 08/03/17 0357  WBC 8.3 10.4  HGB 7.9* 8.2*  HCT 23.0* 23.9*  PLT 270 325   BMET Recent Labs    08/02/17 0434 08/03/17 0357  NA 135  133* 136  K 4.6  4.6 4.2  CL 96*  96* 97*  CO2 22  20* 26  GLUCOSE 107*  102* 102*  BUN 78*  78* 52*  CREATININE 11.08*  11.17* 8.05*  CALCIUM 8.7*  8.6* 8.5*   PT/INR No results for input(s): LABPROT, INR in the last 72 hours. CMP     Component Value Date/Time   NA 136 08/03/2017 0357   K 4.2 08/03/2017 0357   CL 97 (L) 08/03/2017 0357   CO2 26 08/03/2017 0357   GLUCOSE 102 (H) 08/03/2017 0357   BUN 52 (H) 08/03/2017 0357   CREATININE 8.05 (H) 08/03/2017 0357    CALCIUM 8.5 (L) 08/03/2017 0357   PROT 5.3 (L) 07/22/2017 0802   ALBUMIN 2.3 (L) 08/03/2017 0357   AST 19 07/22/2017 0802   ALT 20 07/22/2017 0802   ALKPHOS 85 07/22/2017 0802   BILITOT 0.9 07/22/2017 0802   GFRNONAA 8 (L) 08/03/2017 0357   GFRAA 9 (L) 08/03/2017 0357   Lipase     Component Value Date/Time   LIPASE 329 (H) 07/22/2017 0802       Studies/Results: No results found.  Anti-infectives: Anti-infectives (From admission, onward)   Start     Dose/Rate Route Frequency Ordered Stop   08/02/17 1200  levofloxacin (LEVAQUIN) IVPB 500 mg  Status:  Discontinued     500 mg 100 mL/hr over 60 Minutes Intravenous Every 48 hours 07/31/17 1125 08/01/17 0921   08/01/17 2000  ceFEPIme (MAXIPIME) 1 g in sodium chloride 0.9 % 100 mL IVPB     1 g 200 mL/hr over 30 Minutes Intravenous Every 24 hours 08/01/17 0927     08/01/17 1000  ceFEPIme (MAXIPIME) 1 g in sodium chloride 0.9 % 100 mL IVPB  Status:  Discontinued     1 g 200 mL/hr over 30 Minutes Intravenous Every 24 hours 08/01/17 0921 08/01/17 0927   08/01/17 1000  ceFEPIme (MAXIPIME) 1 g in sodium chloride 0.9 % 100 mL IVPB     1 g 200 mL/hr over 30 Minutes  Intravenous  Once 08/01/17 0927 08/01/17 1117   07/31/17 1130  levofloxacin (LEVAQUIN) IVPB 750 mg     750 mg 100 mL/hr over 90 Minutes Intravenous STAT 07/31/17 1125 07/31/17 1406   07/26/17 1000  linezolid (ZYVOX) IVPB 600 mg  Status:  Discontinued     600 mg 300 mL/hr over 60 Minutes Intravenous Every 12 hours 07/26/17 0820 07/26/17 0820   07/16/17 1600  piperacillin-tazobactam (ZOSYN) IVPB 2.25 g  Status:  Discontinued     2.25 g 100 mL/hr over 30 Minutes Intravenous Every 8 hours 07/16/17 1213 07/29/17 1031   07/15/17 1445  vancomycin (VANCOCIN) IVPB 750 mg/150 ml premix  Status:  Discontinued     750 mg 150 mL/hr over 60 Minutes Intravenous To Surgery 07/15/17 1437 07/15/17 2041   07/14/17 2300  piperacillin-tazobactam (ZOSYN) IVPB 3.375 g  Status:  Discontinued      3.375 g 12.5 mL/hr over 240 Minutes Intravenous Every 8 hours 07/13/17 1930 07/14/17 0858   07/14/17 0900  piperacillin-tazobactam (ZOSYN) IVPB 3.375 g  Status:  Discontinued     3.375 g 12.5 mL/hr over 240 Minutes Intravenous Every 8 hours 07/14/17 0858 07/16/17 1213   07/14/17 0445  vancomycin (VANCOCIN) IVPB 750 mg/150 ml premix  Status:  Discontinued     750 mg 150 mL/hr over 60 Minutes Intravenous Every 8 hours 07/14/17 0431 07/16/17 0856   07/13/17 1900  piperacillin-tazobactam (ZOSYN) IVPB 3.375 g  Status:  Discontinued     3.375 g 12.5 mL/hr over 240 Minutes Intravenous Every 8 hours 07/13/17 1847 07/13/17 1930   07/13/17 1530  piperacillin-tazobactam (ZOSYN) IVPB 3.375 g     3.375 g 100 mL/hr over 30 Minutes Intravenous  Once 07/13/17 1515 07/13/17 1636       Assessment/Plan Prior h/o GI bleed with no known cause, workup unrevealing  Complexintra-abdominal abscess S/pex lapSBR and drainage of intra-abdominal abscess5/10 Dr. Redmond Pulling - POD18 - path:GASTROINTESTINAL STROMAL TUMOR (GIST), SPANNING 1.7 CM; TRANSMURAL DEFECT;INFLAMED OMENTUM;THE SURGICAL RESECTION MARGINS ARE NEGATIVE FOR TUMOR - VACd/c 5/17, now W2D dressing changes -toleratingsoft dietand having bowel function -JPdrainremoved 5/25 - CT 5/17 showedRound intermediate density collection in the deep RIGHT pelvis, abscess vs normal postop fluid;No upper abdomen complication following abscess/gastrointestinal stromal tumor resection from small bowel; no leak 2. ABL anemia-2U PRBC's 5/22.Hgb8.2 - recheck in AM tomorrow, if stable may be able to start SQ heparin  HCAP- WBC 10.4, afebrile last 24h, continue IV abx a - CXR this AM looked improved on my preliminary read  ARF -appreciate nephrology's assistance -HD started on 5/20 -cr8.05, BUN52. YFV4 -UOP 1550 in 24 h   ID -zosyn5/8>>5/24; levaquin 5/26; cefepime 5/27>> VTE -SCDs, can start SQ heparin today FEN -soft diet,  Boost/protein shakes Foley -d/c 5/15, strict I&Os Follow up -Dr. Redmond Pulling  Plan -continue abx. Dialysis per nephrology. Considering CT with PO contrast, but on the fence about whether or not it is needed - will discuss further with MD.     LOS: 21 days    Brigid Re , [facility] 08/03/2017, 8:04 AM Pager: [phone] Consults: [phone] Mon-Fri 7:00 am-4:30 pm Sat-Sun 7:00 am-11:30 am

## 2017-08-04 LAB — BASIC METABOLIC PANEL
Anion gap: 12 (ref 5–15)
BUN: 57 mg/dL — ABNORMAL HIGH (ref 6–20)
CO2: 26 mmol/L (ref 22–32)
Calcium: 8.7 mg/dL — ABNORMAL LOW (ref 8.9–10.3)
Chloride: 101 mmol/L (ref 101–111)
Creatinine, Ser: 9.12 mg/dL — ABNORMAL HIGH (ref 0.61–1.24)
GFR calc Af Amer: 8 mL/min — ABNORMAL LOW (ref >60)
GFR calc non Af Amer: 7 mL/min — ABNORMAL LOW (ref >60)
Glucose, Bld: 96 mg/dL (ref 65–99)
Potassium: 3.9 mmol/L (ref 3.5–5.1)
Sodium: 139 mmol/L (ref 135–145)

## 2017-08-04 LAB — RENAL FUNCTION PANEL
Albumin: 2.2 g/dL — ABNORMAL LOW (ref 3.5–5.0)
Anion gap: 14 (ref 5–15)
BUN: 58 mg/dL — ABNORMAL HIGH (ref 6–20)
CO2: 25 mmol/L (ref 22–32)
Calcium: 8.7 mg/dL — ABNORMAL LOW (ref 8.9–10.3)
Chloride: 100 mmol/L — ABNORMAL LOW (ref 101–111)
Creatinine, Ser: 9.08 mg/dL — ABNORMAL HIGH (ref 0.61–1.24)
GFR calc Af Amer: 8 mL/min — ABNORMAL LOW (ref >60)
GFR calc non Af Amer: 7 mL/min — ABNORMAL LOW (ref >60)
Glucose, Bld: 96 mg/dL (ref 65–99)
Phosphorus: 6.5 mg/dL — ABNORMAL HIGH (ref 2.5–4.6)
Potassium: 3.9 mmol/L (ref 3.5–5.1)
Sodium: 139 mmol/L (ref 135–145)

## 2017-08-04 LAB — CBC
HCT: 22.3 % — ABNORMAL LOW (ref 39.0–52.0)
Hemoglobin: 7.5 g/dL — ABNORMAL LOW (ref 13.0–17.0)
MCH: 30 pg (ref 26.0–34.0)
MCHC: 33.6 g/dL (ref 30.0–36.0)
MCV: 89.2 fL (ref 78.0–100.0)
Platelets: 308 10*3/uL (ref 150–400)
RBC: 2.5 MIL/uL — ABNORMAL LOW (ref 4.22–5.81)
RDW: 13.2 % (ref 11.5–15.5)
WBC: 8.6 10*3/uL (ref 4.0–10.5)

## 2017-08-04 LAB — PARATHYROID HORMONE, INTACT (NO CA): PTH: 45 pg/mL (ref 15–65)

## 2017-08-04 MED ORDER — SACCHAROMYCES BOULARDII 250 MG PO CAPS
250.0000 mg | ORAL_CAPSULE | Freq: Two times a day (BID) | ORAL | Status: DC
  Administered 2017-08-04 – 2017-08-08 (×9): 250 mg via ORAL
  Filled 2017-08-04 (×9): qty 1

## 2017-08-04 MED ORDER — OXYCODONE HCL 5 MG PO TABS: 5.0000 mg | ORAL_TABLET | ORAL | Status: DC | PRN

## 2017-08-04 MED ORDER — PREMIER PROTEIN SHAKE
11.0000 [oz_av] | ORAL | Status: DC
  Administered 2017-08-04: 11 [oz_av] via ORAL
  Filled 2017-08-04 (×5): qty 325.31

## 2017-08-04 MED ORDER — SODIUM CHLORIDE 0.9 % IV SOLN
510.0000 mg | INTRAVENOUS | Status: AC
  Administered 2017-08-04 – 2017-08-07 (×2): 510 mg via INTRAVENOUS
  Filled 2017-08-04 (×3): qty 17

## 2017-08-04 NOTE — Plan of Care (Signed)
  Problem: Clinical Measurements: Goal: Diagnostic test results will improve Outcome: Progressing Goal: Respiratory complications will improve Outcome: Progressing   Problem: Nutrition: Goal: Adequate nutrition will be maintained Outcome: Progressing   

## 2017-08-04 NOTE — Progress Notes (Addendum)
Nutrition Follow-up  DOCUMENTATION CODES:   Not applicable  INTERVENTION:   - D/c Nepro, due to poor acceptance -Boost Breeze po BID, each supplement provides 250 kcal and 9 grams of protein -Premier Protein q HS, each supplement provides 160 kcals and 30 grams protein -Continue renal MVI daily  NUTRITION DIAGNOSIS:   Inadequate oral intake related to decreased appetite as evidenced by meal completion < 50%.  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   Case dicussed with RN prior to visit, who confirms pt with poor oral intake. He is consuming Boost Breeze supplements, but is refusing Nepro. RN considered Engineer, civil (consulting) Protein supplements, however, hesitant to do so due to renal restrictions.   Spoke with pt (who was sitting in recliner char at time of visit) and father. Pt shares that his appetite is improving, but is variable from day to day; pt consumed grits and a few bites of eggs this morning and has not received lunch yet. Pt reports his appetite was "through the roof" after HD yesterday. Meal completion records confirm variable appetite (PO: 5-100%); averaging 25-50% of meals.   Pt sipping on kombucha at time of visit, which he was approved to consume by PA. He estimates he is consuming one Breeze supplement daily, but complains that it is too sweet. He does not like the Nepro supplements. Offered Prostat, however, pt refused. Pt amenable to try Premier Protein (while not renal-friendly, will provide given decreased oral intake).   Pt in good spirits and provided encouragement and emotional  support. Discussed importance of good meal and supplement intake to support healing.   Per [facility] notes, investigating possible need for outpatient HD. Per nephrology notes, no need for HD today.   Labs reviewed: K WDL, Phos: 6.5.   Diet Order:   Diet Order           Diet renal with fluid restriction Fluid restriction: 2000 mL Fluid; Room service appropriate? Yes; Fluid consistency: Thin  Diet effective now          EDUCATION NEEDS:   Education needs have been addressed  Skin:  Skin Assessment: Skin Integrity Issues: Skin Integrity Issues:: Incisions Wound Vac: removed Incisions: abdomen  Last BM:  08/02/17  Height:   Ht Readings from Last 1 Encounters:  07/16/17 5\' 7"  (1.702 m)    Weight:   Wt Readings from Last 1 Encounters:  08/02/17 173 lb 1 oz (78.5 kg)    Ideal Body Weight:  67.3 kg  BMI:  Body mass index is 27.11 kg/m.  Estimated Nutritional Needs:   Kcal:  2000-2200  Protein:  95-110 grams  Fluid:  2.0-2.2 L    Damesha Lawler A. Jimmye Norman, RD, LDN, CDE Pager: [phone] After hours Pager: [phone]

## 2017-08-04 NOTE — Progress Notes (Signed)
[facility] Progress Note  20 Days Post-Op  Subjective: CC: no complaints Patient in much better spirits this AM. Pre-medicated with zofran prior to abx yesterday and had improvement in nausea. Having bowel function, no evidence of blood in stools. Denies cough or SOB. Afebrile.   Objective: Vital signs in last 24 hours: Temp:  [97.8 F (36.6 C)-99 F (37.2 C)] 99 F (37.2 C) (05/30 0553) Pulse Rate:  [83-98] 98 (05/30 0553) Resp:  [16-18] 18 (05/30 0553) BP: (134-146)/(93-109) 140/93 (05/30 0553) SpO2:  [98 %-100 %] 98 % (05/30 0553) Last BM Date: 08/02/17  Intake/Output from previous day: 05/29 0701 - 05/30 0700 In: 640 [P.O.:540; IV Piggyback:100] Out: 1600 [Urine:1600] Intake/Output this shift: No intake/output data recorded.  PE: Gen: Alert, NAD, pleasant Card: Regular rate and rhythm, pedal pulses 2+ BL, extremity edema improving Pulm: Normal effort, clear to auscultation bilaterally Abd: Soft, non-tender, non-distended, bowel sounds present, no HSM,midline woundclean with good granulation tissue Skin: warm and dry, no rashes Psych: A&Ox3    Lab Results:  Recent Labs    08/03/17 0357 08/04/17 0408  WBC 10.4 8.6  HGB 8.2* 7.5*  HCT 23.9* 22.3*  PLT 325 308   BMET Recent Labs    08/03/17 0357 08/04/17 0408  NA 136 139  139  K 4.2 3.9  3.9  CL 97* 100*  101  CO2 26 25  26   GLUCOSE 102* 96  96  BUN 52* 58*  57*  CREATININE 8.05* 9.08*  9.12*  CALCIUM 8.5* 8.7*  8.7*   PT/INR No results for input(s): LABPROT, INR in the last 72 hours. CMP     Component Value Date/Time   NA 139 08/04/2017 0408   NA 139 08/04/2017 0408   K 3.9 08/04/2017 0408   K 3.9 08/04/2017 0408   CL 100 (L) 08/04/2017 0408   CL 101 08/04/2017 0408   CO2 25 08/04/2017 0408   CO2 26 08/04/2017 0408   GLUCOSE 96 08/04/2017 0408   GLUCOSE 96 08/04/2017 0408   BUN 58 (H) 08/04/2017 0408   BUN 57 (H) 08/04/2017 0408   CREATININE 9.08 (H) 08/04/2017  0408   CREATININE 9.12 (H) 08/04/2017 0408   CALCIUM 8.7 (L) 08/04/2017 0408   CALCIUM 8.7 (L) 08/04/2017 0408   PROT 5.3 (L) 07/22/2017 0802   ALBUMIN 2.2 (L) 08/04/2017 0408   AST 19 07/22/2017 0802   ALT 20 07/22/2017 0802   ALKPHOS 85 07/22/2017 0802   BILITOT 0.9 07/22/2017 0802   GFRNONAA 7 (L) 08/04/2017 0408   GFRNONAA 7 (L) 08/04/2017 0408   GFRAA 8 (L) 08/04/2017 0408   GFRAA 8 (L) 08/04/2017 0408   Lipase     Component Value Date/Time   LIPASE 329 (H) 07/22/2017 0802       Studies/Results: Dg Chest Port 1 View  Result Date: 08/03/2017 CLINICAL DATA:  Pneumonia/fever. EXAM: PORTABLE CHEST 1 VIEW COMPARISON:  07/31/2017 and prior radiographs FINDINGS: A RIGHT central venous catheter with tip overlying the SUPERIOR cavoatrial junction again noted. LEFT LOWER lung consolidation/atelectasis and RIGHT basilar atelectasis noted. Small bilateral pleural effusions may have slightly decreased. There is no evidence of pneumothorax. Cardiomediastinal silhouette is unchanged. IMPRESSION: Question slight decrease in small bilateral pleural effusions. Otherwise unchanged appearance of the chest with continued LEFT LOWER lung consolidation/atelectasis and RIGHT basilar atelectasis. Electronically Signed   By: Margarette Canada M.D.   On: 08/03/2017 08:56    Anti-infectives: Anti-infectives (From admission, onward)   Start  Dose/Rate Route Frequency Ordered Stop   08/02/17 1200  levofloxacin (LEVAQUIN) IVPB 500 mg  Status:  Discontinued     500 mg 100 mL/hr over 60 Minutes Intravenous Every 48 hours 07/31/17 1125 08/01/17 0921   08/01/17 2000  ceFEPIme (MAXIPIME) 1 g in sodium chloride 0.9 % 100 mL IVPB     1 g 200 mL/hr over 30 Minutes Intravenous Every 24 hours 08/01/17 0927     08/01/17 1000  ceFEPIme (MAXIPIME) 1 g in sodium chloride 0.9 % 100 mL IVPB  Status:  Discontinued     1 g 200 mL/hr over 30 Minutes Intravenous Every 24 hours 08/01/17 0921 08/01/17 0927   08/01/17 1000   ceFEPIme (MAXIPIME) 1 g in sodium chloride 0.9 % 100 mL IVPB     1 g 200 mL/hr over 30 Minutes Intravenous  Once 08/01/17 0927 08/01/17 1117   07/31/17 1130  levofloxacin (LEVAQUIN) IVPB 750 mg     750 mg 100 mL/hr over 90 Minutes Intravenous STAT 07/31/17 1125 07/31/17 1406   07/26/17 1000  linezolid (ZYVOX) IVPB 600 mg  Status:  Discontinued     600 mg 300 mL/hr over 60 Minutes Intravenous Every 12 hours 07/26/17 0820 07/26/17 0820   07/16/17 1600  piperacillin-tazobactam (ZOSYN) IVPB 2.25 g  Status:  Discontinued     2.25 g 100 mL/hr over 30 Minutes Intravenous Every 8 hours 07/16/17 1213 07/29/17 1031   07/15/17 1445  vancomycin (VANCOCIN) IVPB 750 mg/150 ml premix  Status:  Discontinued     750 mg 150 mL/hr over 60 Minutes Intravenous To Surgery 07/15/17 1437 07/15/17 2041   07/14/17 2300  piperacillin-tazobactam (ZOSYN) IVPB 3.375 g  Status:  Discontinued     3.375 g 12.5 mL/hr over 240 Minutes Intravenous Every 8 hours 07/13/17 1930 07/14/17 0858   07/14/17 0900  piperacillin-tazobactam (ZOSYN) IVPB 3.375 g  Status:  Discontinued     3.375 g 12.5 mL/hr over 240 Minutes Intravenous Every 8 hours 07/14/17 0858 07/16/17 1213   07/14/17 0445  vancomycin (VANCOCIN) IVPB 750 mg/150 ml premix  Status:  Discontinued     750 mg 150 mL/hr over 60 Minutes Intravenous Every 8 hours 07/14/17 0431 07/16/17 0856   07/13/17 1900  piperacillin-tazobactam (ZOSYN) IVPB 3.375 g  Status:  Discontinued     3.375 g 12.5 mL/hr over 240 Minutes Intravenous Every 8 hours 07/13/17 1847 07/13/17 1930   07/13/17 1530  piperacillin-tazobactam (ZOSYN) IVPB 3.375 g     3.375 g 100 mL/hr over 30 Minutes Intravenous  Once 07/13/17 1515 07/13/17 1636       Assessment/Plan Prior h/o GI bleed with no known cause, workup unrevealing  Complexintra-abdominal abscess S/pex lapSBR and drainage of intra-abdominal abscess5/10 Dr. 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;  TRANSMURAL DEFECT;INFLAMED OMENTUM;THE SURGICAL RESECTION MARGINS ARE NEGATIVE FOR TUMOR - VACd/c 5/17, now W2D dressing changes -toleratingsoft dietand having bowel function -JPdrainremoved 5/25 - CT 5/17 showedRound intermediate density collection in the deep RIGHT pelvis, abscess vs normal postop fluid;No upper abdomen complication following abscess/gastrointestinal stromal tumor resection from small bowel; no leak ABL anemia-2U PRBC's 5/22.Hgb7.5, VSS - recheck tomorrow - started SQ heparin yesterday HCAP-WBC 8.6, afebrile last 24h, continue IV abx a - CXR yesterday not worsened  ARF -appreciate nephrology's assistance -HD started on 5/20 - working on arranging OP HD -cr9.08, [id number]. ZOX0 -UOP1600 in 24 h   ID -zosyn5/8>>5/24; levaquin 5/26; cefepime 5/27>> VTE -SCDs, SQ heparin  FEN -soft diet, Boost/protein shakes  Foley -d/c 5/15, strict I&Os Follow up -Dr. Redmond Pulling  Plan -continue abx, will transition to PO abx prior to discharge. Dialysis per nephrology.     LOS: 22 days    Brigid Re , [facility] 08/04/2017, 8:06 AM Pager: [phone] Consults: [phone] Mon-Fri 7:00 am-4:30 pm Sat-Sun 7:00 am-11:30 am

## 2017-08-04 NOTE — Progress Notes (Signed)
Pharmacy Antibiotic Note  Joe Morris is a 37 y.o. male admitted on 07/13/2017 with abdominal abscesses.  Cefepime for hcap. Pt on intermittent HD, last HD 5/28. Blood cultures ngtd.  Zosyn 5/8 > 5/24 Levaquin 5/26 x1 Cefepime 5/27 >>   Plan: Continue cefepime 1g IV Q24h Monitor clinical picture, renal function F/U C&S, abx deescalation / LOT   Height: 5\' 7"  (170.2 cm) Weight: 173 lb 1 oz (78.5 kg) IBW/kg (Calculated) : 66.1  Temp (24hrs), Avg:98.6 F (37 C), Min:97.8 F (36.6 C), Max:99 F (37.2 C)  Recent Labs  Lab 07/30/17 0403 07/31/17 0433 07/31/17 1150 08/01/17 0428 08/02/17 0434 08/03/17 0357 08/04/17 0408  WBC 8.9  --  9.6  --  8.3 10.4 8.6  CREATININE 6.37* 8.08*  --  9.52* 11.08*  11.17* 8.05* 9.08*  9.12*      Harvel Quale 08/04/2017 10:03 AM

## 2017-08-04 NOTE — Progress Notes (Signed)
Patient ID: Joe Morris, male   DOB: 05-12-1980, 37 y.o.   MRN: [id number] [facility] Progress Note   Assessment/ Plan:   1. Acute kidney injury: Suspected to be multifactorial ATN-hemodynamic following abdominal surgery/vancomycin/intravenous contrast.  On intermittent dialysis since 5/21.  Continues to show good urine output with 1.6 L urine output overnight recorded however, no functional renal recovery and his creatinine continues to rise.  No indications for hemodialysis today.  Continue to monitor daily labs/clinical status.  Anticipate mobilization of his volume with increasing urine output/renal recovery. 2.  Status post exploratory laparotomy for small bowel resection/complex intra-abdominal abscess: Pathological evaluation revealing gastrointestinal stromal tumor with clear margins of excision.  Remains on cefepime. 3.  Acute blood loss anemia: Significant iron deficiency noted on labs yesterday-will order intravenous iron 4.  Healthcare associated pneumonia: Appears to be improving clinically on cefepime monotherapy.  5.  Hyperphosphatemia: Secondary to acute kidney injury/impaired phosphorus handling.  Currently on calcium acetate for acute control.  Subjective:   Reports to be feeling better, had some dysgeusia last night but this morning feels well.  Denies nausea/vomiting/myoclonic jerk.   Objective:   BP (!) 140/93 (BP Location: Left Arm)   Pulse 98   Temp 99 F (37.2 C) (Oral)   Resp 18   Ht 5\' 7"  (1.702 m)   Wt 78.5 kg (173 lb 1 oz)   SpO2 98%   BMI 27.11 kg/m   Intake/Output Summary (Last 24 hours) at 08/04/2017 2951 Last data filed at 08/04/2017 0555 Gross per 24 hour  Intake 640 ml  Output 1600 ml  Net -960 ml   Weight change:   Physical Exam: Gen: Comfortably sitting up in recliner, eating breakfast CVS: Pulse regular rhythm, normal rate, ejection systolic murmur over apex Resp: Clear to auscultation bilaterally, no rales/rhonchi Abd: Soft,  nontender, midline wound clean with good granulation Ext: Trace-1+ ankle edema  Imaging: Dg Chest Port 1 View  Result Date: 08/03/2017 CLINICAL DATA:  Pneumonia/fever. EXAM: PORTABLE CHEST 1 VIEW COMPARISON:  07/31/2017 and prior radiographs FINDINGS: A RIGHT central venous catheter with tip overlying the SUPERIOR cavoatrial junction again noted. LEFT LOWER lung consolidation/atelectasis and RIGHT basilar atelectasis noted. Small bilateral pleural effusions may have slightly decreased. There is no evidence of pneumothorax. Cardiomediastinal silhouette is unchanged. IMPRESSION: Question slight decrease in small bilateral pleural effusions. Otherwise unchanged appearance of the chest with continued LEFT LOWER lung consolidation/atelectasis and RIGHT basilar atelectasis. Electronically Signed   By: Margarette Canada M.D.   On: 08/03/2017 08:56    Labs: BMET Recent Labs  Lab 07/29/17 0406 07/30/17 0403 07/31/17 8841 08/01/17 0428 08/02/17 0434 08/03/17 0357 08/04/17 0408  NA 135 136 133* 134* 135  133* 136 139  139  K 3.7 4.1 4.2 4.7 4.6  4.6 4.2 3.9  3.9  CL 99* 98* 96* 96* 96*  96* 97* 100*  101  CO2 23 26 25 23 22   20* 26 25  26   GLUCOSE 96 92 94 103* 107*  102* 102* 96  96  BUN 60* 35* 48* 63* 78*  78* 52* 58*  57*  CREATININE 10.20* 6.37* 8.08* 9.52* 11.08*  11.17* 8.05* 9.08*  9.12*  CALCIUM 8.5* 8.2* 8.3* 8.5* 8.7*  8.6* 8.5* 8.7*  8.7*  PHOS 7.1* 5.4* 7.2* 8.0* 10.2* 5.7* 6.5*   CBC Recent Labs  Lab 07/29/17 0406  07/31/17 1150 08/02/17 0434 08/03/17 0357 08/04/17 0408  WBC 9.0   < > 9.6 8.3 10.4 8.6  NEUTROABS  6.2  --   --   --   --   --   HGB 8.1*   < > 8.4* 7.9* 8.2* 7.5*  HCT 23.1*   < > 25.0* 23.0* 23.9* 22.3*  MCV 88.5   < > 89.6 90.2 88.8 89.2  PLT 365   < > 380 270 325 308   < > = values in this interval not displayed.    Medications:    . calcium acetate  1,334 mg Oral TID WC  . Chlorhexidine Gluconate Cloth  6 each Topical Q0600  .  darbepoetin (ARANESP) injection - DIALYSIS  100 mcg Intravenous Q Tue-HD  . feeding supplement  1 Container Oral BID BM  . feeding supplement (NEPRO CARB STEADY)  237 mL Oral BID BM  . ferrous gluconate  324 mg Oral QPC supper  . heparin injection (subcutaneous)  5,000 Units Subcutaneous Q8H  . multivitamin  1 tablet Oral QHS  . pantoprazole  40 mg Oral Daily  . saccharomyces boulardii  250 mg Oral BID   Elmarie Shiley, MD 08/04/2017, 9:24 AM

## 2017-08-05 LAB — RENAL FUNCTION PANEL
Albumin: 2.3 g/dL — ABNORMAL LOW (ref 3.5–5.0)
Anion gap: 9 (ref 5–15)
BUN: 66 mg/dL — ABNORMAL HIGH (ref 6–20)
CO2: 24 mmol/L (ref 22–32)
Calcium: 8.7 mg/dL — ABNORMAL LOW (ref 8.9–10.3)
Chloride: 106 mmol/L (ref 101–111)
Creatinine, Ser: 9.57 mg/dL — ABNORMAL HIGH (ref 0.61–1.24)
GFR calc Af Amer: 7 mL/min — ABNORMAL LOW (ref >60)
GFR calc non Af Amer: 6 mL/min — ABNORMAL LOW (ref >60)
Glucose, Bld: 97 mg/dL (ref 65–99)
Phosphorus: 6.2 mg/dL — ABNORMAL HIGH (ref 2.5–4.6)
Potassium: 4.2 mmol/L (ref 3.5–5.1)
Sodium: 139 mmol/L (ref 135–145)

## 2017-08-05 LAB — CBC
HCT: 26.1 % — ABNORMAL LOW (ref 39.0–52.0)
Hemoglobin: 8.8 g/dL — ABNORMAL LOW (ref 13.0–17.0)
MCH: 30 pg (ref 26.0–34.0)
MCHC: 33.7 g/dL (ref 30.0–36.0)
MCV: 89.1 fL (ref 78.0–100.0)
Platelets: 409 10*3/uL — ABNORMAL HIGH (ref 150–400)
RBC: 2.93 MIL/uL — ABNORMAL LOW (ref 4.22–5.81)
RDW: 13.5 % (ref 11.5–15.5)
WBC: 8.9 10*3/uL (ref 4.0–10.5)

## 2017-08-05 MED ORDER — AMOXICILLIN-POT CLAVULANATE 500-125 MG PO TABS
1.0000 | ORAL_TABLET | ORAL | Status: DC
  Administered 2017-08-05 – 2017-08-07 (×3): 500 mg via ORAL
  Filled 2017-08-05 (×3): qty 1

## 2017-08-05 NOTE — Progress Notes (Signed)
S:Feels well, no nausea today O:BP (!) 138/99 (BP Location: Left Arm)   Pulse 88   Temp 98.2 F (36.8 C) (Oral)   Resp 17   Ht 5\' 7"  (1.702 m)   Wt 78.5 kg (173 lb 1 oz)   SpO2 96%   BMI 27.11 kg/m   Intake/Output Summary (Last 24 hours) at 08/05/2017 1007 Last data filed at 08/05/2017 0829 Gross per 24 hour  Intake 770 ml  Output 3400 ml  Net -2630 ml   Intake/Output: I/O last 3 completed shifts: In: 33 [P.O.:820; IV Piggyback:100] Out: 3350 [Urine:3350]  Intake/Output this shift:  Total I/O In: 250 [P.O.:250] Out: 900 [Urine:900] Weight change:  Gen: NAD CVS: no rub Resp: cta Abd: +BS, soft Ext: no edema  Recent Labs  Lab 07/30/17 0403 07/31/17 0433 08/01/17 0428 08/02/17 0434 08/03/17 0357 08/04/17 0408 08/05/17 0433  NA 136 133* 134* 135  133* 136 139  139 139  K 4.1 4.2 4.7 4.6  4.6 4.2 3.9  3.9 4.2  CL 98* 96* 96* 96*  96* 97* 100*  101 106  CO2 26 25 23 22   20* 26 25  26 24   GLUCOSE 92 94 103* 107*  102* 102* 96  96 97  BUN 35* 48* 63* 78*  78* 52* 58*  57* 66*  CREATININE 6.37* 8.08* 9.52* 11.08*  11.17* 8.05* 9.08*  9.12* 9.57*  ALBUMIN 2.3* 2.5* 2.4* 2.3* 2.3* 2.2* 2.3*  CALCIUM 8.2* 8.3* 8.5* 8.7*  8.6* 8.5* 8.7*  8.7* 8.7*  PHOS 5.4* 7.2* 8.0* 10.2* 5.7* 6.5* 6.2*   Liver Function Tests: Recent Labs  Lab 08/03/17 0357 08/04/17 0408 08/05/17 0433  ALBUMIN 2.3* 2.2* 2.3*   No results for input(s): LIPASE, AMYLASE in the last 168 hours. No results for input(s): AMMONIA in the last 168 hours. CBC: Recent Labs  Lab 07/31/17 1150 08/02/17 0434 08/03/17 0357 08/04/17 0408 08/05/17 0839  WBC 9.6 8.3 10.4 8.6 8.9  HGB 8.4* 7.9* 8.2* 7.5* 8.8*  HCT 25.0* 23.0* 23.9* 22.3* 26.1*  MCV 89.6 90.2 88.8 89.2 89.1  PLT 380 270 325 308 409*   Cardiac Enzymes: No results for input(s): CKTOTAL, CKMB, CKMBINDEX, TROPONINI in the last 168 hours. CBG: No results for input(s): GLUCAP in the last 168 hours.  Iron Studies:  Recent  Labs    08/03/17 1857  IRON 26*  TIBC 228*  FERRITIN 249   Studies/Results: No results found. . calcium acetate  1,334 mg Oral TID WC  . Chlorhexidine Gluconate Cloth  6 each Topical Q0600  . darbepoetin (ARANESP) injection - DIALYSIS  100 mcg Intravenous Q Tue-HD  . feeding supplement  1 Container Oral BID BM  . ferrous gluconate  324 mg Oral QPC supper  . heparin injection (subcutaneous)  5,000 Units Subcutaneous Q8H  . multivitamin  1 tablet Oral QHS  . pantoprazole  40 mg Oral Daily  . protein supplement shake  11 oz Oral Q24H  . saccharomyces boulardii  250 mg Oral BID    BMET    Component Value Date/Time   NA 139 08/05/2017 0433   K 4.2 08/05/2017 0433   CL 106 08/05/2017 0433   CO2 24 08/05/2017 0433   GLUCOSE 97 08/05/2017 0433   BUN 66 (H) 08/05/2017 0433   CREATININE 9.57 (H) 08/05/2017 0433   CALCIUM 8.7 (L) 08/05/2017 0433   GFRNONAA 6 (L) 08/05/2017 0433   GFRAA 7 (L) 08/05/2017 0433   CBC    Component Value Date/Time  WBC 8.9 08/05/2017 0839   RBC 2.93 (L) 08/05/2017 0839   HGB 8.8 (L) 08/05/2017 0839   HCT 26.1 (L) 08/05/2017 0839   PLT 409 (H) 08/05/2017 0839   MCV 89.1 08/05/2017 0839   MCH 30.0 08/05/2017 0839   MCHC 33.7 08/05/2017 0839   RDW 13.5 08/05/2017 0839   LYMPHSABS 1.3 07/29/2017 0406   MONOABS 1.1 (H) 07/29/2017 0406   EOSABS 0.3 07/29/2017 0406   BASOSABS 0.1 07/29/2017 0406     Assessment/Plan:  1. AKI- presumably due to ischemic ATN in setting of abdominal surgery/vancomycin/IV contrast as well as hemodynamic changes.  His UOP has continued to improve, however his BUN/Cr continue to rise (albeit at a slower rate).  I discussed this with Joe Morris and I also expressed concern that his temp HD cath was placed on 07/25/17 and will need to come out.  I presented several options and he would like to have a short session of HD today and pull the catheter afterwards in hopes that he can avoid having another one placed next week (since  he is starting to have improved function).  Will plan a short session of HD and remove cath (will keep even) and increase ivf's given marked diuresis overnight.  2. S/p exp lap for small bowel resection of complex intra-abdominal abscess- on abx 3. ABLA- on aranesp 4. Hyperphosphatemia- on calcium acetate 5. Vascular access- as above will need to remove HD catheter as it has been in for 11 days.  Donetta Potts, MD [name] [phone]

## 2017-08-05 NOTE — Progress Notes (Signed)
[facility] Progress Note  21 Days Post-Op  Subjective: CC: No complaints Urine output picked up. Some nausea intermittently but no vomiting. Had 2 good BMs yesterday. Denies SOB or cough. Got outside yesterday.   Objective: Vital signs in last 24 hours: Temp:  [98.2 F (36.8 C)-98.5 F (36.9 C)] 98.2 F (36.8 C) (05/31 0440) Pulse Rate:  [87-88] 88 (05/31 0440) Resp:  [17] 17 (05/31 0440) BP: (138-156)/(99-104) 138/99 (05/31 0440) SpO2:  [96 %-99 %] 96 % (05/31 0440) Last BM Date: 08/04/17  Intake/Output from previous day: 05/30 0701 - 05/31 0700 In: 520 [P.O.:520] Out: 2500 [Urine:2500] Intake/Output this shift: No intake/output data recorded.  PE: Gen: Alert, NAD, pleasant Card: Regular rate and rhythm, pedal pulses 2+ BL, extremity edema improving Pulm: Normal effort, clear to auscultation bilaterally Abd: Soft, non-tender, non-distended, bowel sounds present, no HSM,midline woundclean with good granulation tissue - may be starting to hypergranulate a little at inferior aspect of wound Skin: warm and dry, no rashes Psych: A&Ox3    Lab Results:  Recent Labs    08/03/17 0357 08/04/17 0408  WBC 10.4 8.6  HGB 8.2* 7.5*  HCT 23.9* 22.3*  PLT 325 308   BMET Recent Labs    08/04/17 0408 08/05/17 0433  NA 139  139 139  K 3.9  3.9 4.2  CL 100*  101 106  CO2 25  26 24   GLUCOSE 96  96 97  BUN 58*  57* 66*  CREATININE 9.08*  9.12* 9.57*  CALCIUM 8.7*  8.7* 8.7*   PT/INR No results for input(s): LABPROT, INR in the last 72 hours. CMP     Component Value Date/Time   NA 139 08/05/2017 0433   K 4.2 08/05/2017 0433   CL 106 08/05/2017 0433   CO2 24 08/05/2017 0433   GLUCOSE 97 08/05/2017 0433   BUN 66 (H) 08/05/2017 0433   CREATININE 9.57 (H) 08/05/2017 0433   CALCIUM 8.7 (L) 08/05/2017 0433   PROT 5.3 (L) 07/22/2017 0802   ALBUMIN 2.3 (L) 08/05/2017 0433   AST 19 07/22/2017 0802   ALT 20 07/22/2017 0802   ALKPHOS 85  07/22/2017 0802   BILITOT 0.9 07/22/2017 0802   GFRNONAA 6 (L) 08/05/2017 0433   GFRAA 7 (L) 08/05/2017 0433   Lipase     Component Value Date/Time   LIPASE 329 (H) 07/22/2017 0802       Studies/Results: No results found.  Anti-infectives: Anti-infectives (From admission, onward)   Start     Dose/Rate Route Frequency Ordered Stop   08/02/17 1200  levofloxacin (LEVAQUIN) IVPB 500 mg  Status:  Discontinued     500 mg 100 mL/hr over 60 Minutes Intravenous Every 48 hours 07/31/17 1125 08/01/17 0921   08/01/17 2000  ceFEPIme (MAXIPIME) 1 g in sodium chloride 0.9 % 100 mL IVPB     1 g 200 mL/hr over 30 Minutes Intravenous Every 24 hours 08/01/17 0927     08/01/17 1000  ceFEPIme (MAXIPIME) 1 g in sodium chloride 0.9 % 100 mL IVPB  Status:  Discontinued     1 g 200 mL/hr over 30 Minutes Intravenous Every 24 hours 08/01/17 0921 08/01/17 0927   08/01/17 1000  ceFEPIme (MAXIPIME) 1 g in sodium chloride 0.9 % 100 mL IVPB     1 g 200 mL/hr over 30 Minutes Intravenous  Once 08/01/17 0927 08/01/17 1117   07/31/17 1130  levofloxacin (LEVAQUIN) IVPB 750 mg     750 mg 100 mL/hr over 90  Minutes Intravenous STAT 07/31/17 1125 07/31/17 1406   07/26/17 1000  linezolid (ZYVOX) IVPB 600 mg  Status:  Discontinued     600 mg 300 mL/hr over 60 Minutes Intravenous Every 12 hours 07/26/17 0820 07/26/17 0820   07/16/17 1600  piperacillin-tazobactam (ZOSYN) IVPB 2.25 g  Status:  Discontinued     2.25 g 100 mL/hr over 30 Minutes Intravenous Every 8 hours 07/16/17 1213 07/29/17 1031   07/15/17 1445  vancomycin (VANCOCIN) IVPB 750 mg/150 ml premix  Status:  Discontinued     750 mg 150 mL/hr over 60 Minutes Intravenous To Surgery 07/15/17 1437 07/15/17 2041   07/14/17 2300  piperacillin-tazobactam (ZOSYN) IVPB 3.375 g  Status:  Discontinued     3.375 g 12.5 mL/hr over 240 Minutes Intravenous Every 8 hours 07/13/17 1930 07/14/17 0858   07/14/17 0900  piperacillin-tazobactam (ZOSYN) IVPB 3.375 g  Status:   Discontinued     3.375 g 12.5 mL/hr over 240 Minutes Intravenous Every 8 hours 07/14/17 0858 07/16/17 1213   07/14/17 0445  vancomycin (VANCOCIN) IVPB 750 mg/150 ml premix  Status:  Discontinued     750 mg 150 mL/hr over 60 Minutes Intravenous Every 8 hours 07/14/17 0431 07/16/17 0856   07/13/17 1900  piperacillin-tazobactam (ZOSYN) IVPB 3.375 g  Status:  Discontinued     3.375 g 12.5 mL/hr over 240 Minutes Intravenous Every 8 hours 07/13/17 1847 07/13/17 1930   07/13/17 1530  piperacillin-tazobactam (ZOSYN) IVPB 3.375 g     3.375 g 100 mL/hr over 30 Minutes Intravenous  Once 07/13/17 1515 07/13/17 1636       Assessment/Plan Prior h/o GI bleed with no known cause, workup unrevealing  Complexintra-abdominal abscess S/pex lapSBR and drainage of intra-abdominal abscess5/10 Dr. 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; TRANSMURAL DEFECT;INFLAMED OMENTUM;THE SURGICAL RESECTION MARGINS ARE NEGATIVE FOR TUMOR - VACd/c 5/17, now W2D dressing changes- wound may be starting to hypergranulate at inferior aspect -toleratingsoft dietand having bowel function - CT 5/17 showedRound intermediate density collection in the deep RIGHT pelvis, abscess vs normal postop fluid;No upper abdomen complication following abscess/gastrointestinal stromal tumor resection from small bowel; no leak ABL anemia-2U PRBC's 5/22.Hgb7.5 yesterday, VSS - cbc pending today - started SQ heparin yesterday HCAP-WBC8.6 yesterday,afebrile last 24h, will transition to PO abx - CXR 5/29 stable - recheck in AM   ARF -appreciate nephrology's assistance -HD started on 5/20 - working on arranging OP HD -cr9.57, [id number]. GFR6 -UOP2.5 Lin 24 h   ID -zosyn5/8>>5/24; levaquin 5/26; cefepime 5/27>> VTE -SCDs, SQ heparin  FEN -soft diet, Boost Foley -d/c 5/15, strict I&Os Follow up -Dr. Redmond Pulling  Plan -transition to PO abx. Dialysis per nephrology.     LOS: 23 days    Brigid Re , [facility] 08/05/2017, 8:07 AM Pager: [phone] Consults: [phone] Mon-Fri 7:00 am-4:30 pm Sat-Sun 7:00 am-11:30 am

## 2017-08-05 NOTE — Procedures (Signed)
Patient was seen on dialysis and the procedure was supervised.  BFR 300  Via vascath BP is  160/99.   Patient appears to be tolerating treatment well  Joe Morris A 08/05/2017

## 2017-08-06 ENCOUNTER — Inpatient Hospital Stay (HOSPITAL_COMMUNITY): Payer: 59

## 2017-08-06 LAB — CBC
HCT: 25.6 % — ABNORMAL LOW (ref 39.0–52.0)
Hemoglobin: 8.4 g/dL — ABNORMAL LOW (ref 13.0–17.0)
MCH: 29.8 pg (ref 26.0–34.0)
MCHC: 32.8 g/dL (ref 30.0–36.0)
MCV: 90.8 fL (ref 78.0–100.0)
Platelets: 373 10*3/uL (ref 150–400)
RBC: 2.82 MIL/uL — ABNORMAL LOW (ref 4.22–5.81)
RDW: 13.6 % (ref 11.5–15.5)
WBC: 8.5 10*3/uL (ref 4.0–10.5)

## 2017-08-06 LAB — CULTURE, BLOOD (ROUTINE X 2)
Culture: NO GROWTH
Culture: NO GROWTH
Special Requests: ADEQUATE
Special Requests: ADEQUATE

## 2017-08-06 LAB — RENAL FUNCTION PANEL
Albumin: 2.5 g/dL — ABNORMAL LOW (ref 3.5–5.0)
Anion gap: 9 (ref 5–15)
BUN: 37 mg/dL — ABNORMAL HIGH (ref 6–20)
CO2: 29 mmol/L (ref 22–32)
Calcium: 8.8 mg/dL — ABNORMAL LOW (ref 8.9–10.3)
Chloride: 105 mmol/L (ref 101–111)
Creatinine, Ser: 6.03 mg/dL — ABNORMAL HIGH (ref 0.61–1.24)
GFR calc Af Amer: 13 mL/min — ABNORMAL LOW (ref >60)
GFR calc non Af Amer: 11 mL/min — ABNORMAL LOW (ref >60)
Glucose, Bld: 99 mg/dL (ref 65–99)
Phosphorus: 4.2 mg/dL (ref 2.5–4.6)
Potassium: 4.2 mmol/L (ref 3.5–5.1)
Sodium: 143 mmol/L (ref 135–145)

## 2017-08-06 MED ORDER — PREMIER PROTEIN SHAKE
11.0000 [oz_av] | Freq: Three times a day (TID) | ORAL | Status: DC
  Administered 2017-08-06 – 2017-08-08 (×3): 11 [oz_av] via ORAL
  Filled 2017-08-06 (×14): qty 325.31

## 2017-08-06 NOTE — Progress Notes (Signed)
Subjective: Interval History: has no complaint, knows abdm still swelling and flanks.  Objective: Vital signs in last 24 hours: Temp:  [97.9 F (36.6 C)-99.2 F (37.3 C)] 99.2 F (37.3 C) (06/01 0446) Pulse Rate:  [82-96] 87 (06/01 0446) Resp:  [16-18] 17 (06/01 0446) BP: (129-168)/(86-103) 141/100 (06/01 0446) SpO2:  [96 %-99 %] 96 % (06/01 0446) Weight:  [77.1 kg (169 lb 15.6 oz)] 77.1 kg (169 lb 15.6 oz) (05/31 1518) Weight change:   Intake/Output from previous day: 05/31 0701 - 06/01 0700 In: 250 [P.O.:250] Out: 3400 [Urine:3400] Intake/Output this shift: Total I/O In: 350 [P.O.:350] Out: -   General appearance: alert, cooperative, no distress and pale Resp: clear to auscultation bilaterally Cardio: S1, S2 normal and systolic murmur: holosystolic 2/6, blowing at apex GI: mild distension, pos bs Extremities: edema 2+  Lab Results: Recent Labs    08/05/17 0839 08/06/17 0647  WBC 8.9 8.5  HGB 8.8* 8.4*  HCT 26.1* 25.6*  PLT 409* 373   BMET:  Recent Labs    08/05/17 0433 08/06/17 0647  NA 139 143  K 4.2 4.2  CL 106 105  CO2 24 29  GLUCOSE 97 99  BUN 66* 37*  CREATININE 9.57* 6.03*  CALCIUM 8.7* 8.8*   Recent Labs    08/03/17 1857  PTH 45   Iron Studies:  Recent Labs    08/03/17 1857  IRON 26*  TIBC 228*  FERRITIN 249    Studies/Results: No results found.  I have reviewed the patient's current medications.  Assessment/Plan: 1 AKI low bps, contrast, sepsis  .  Nonoliguric . Cr today reflects HD yest. Follow next few days. Access out 2 Anemia  3 GIST 4 BP ^, allow to diurese and do not tx P AB, allow to diurese, follow chem.    LOS: 24 days   Joe Morris 08/06/2017,8:35 AM

## 2017-08-06 NOTE — Progress Notes (Signed)
22 Days Post-Op    CC: GI bleed/acute renal failure  Subjective: He looks good is comfortable with a Vas-Cath out.  Ambulating in room without difficulty.  Abdominal incision is stable.  He is tolerating the renal diet without difficulty.  Objective: Vital signs in last 24 hours: Temp:  [97.9 F (36.6 C)-99.2 F (37.3 C)] 99.2 F (37.3 C) (06/01 0446) Pulse Rate:  [82-96] 87 (06/01 0446) Resp:  [16-18] 17 (06/01 0446) BP: (129-168)/(86-103) 141/100 (06/01 0446) SpO2:  [96 %-99 %] 96 % (06/01 0446) Weight:  [77.1 kg (169 lb 15.6 oz)] 77.1 kg (169 lb 15.6 oz) (05/31 1518) Last BM Date: 08/05/17 250 PO recorded Urine 3400 No IV fluid recorded Nothing recorded for HD TM 99.2, HR upper 80-90 rangeBP up 140's/100 range Creatinine is down to 6.03 from high of 16.95 K+ 4.2 WBC 8.5 H/H 8.4/25.6 CXR 5/29:  Question slight decrease in small bilateral pleural effusions. Otherwise unchanged appearance of the chest with continued LEFT LOWER lung consolidation/atelectasis and RIGHT basilar atelectasis  Intake/Output from previous day: 05/31 0701 - 06/01 0700 In: 250 [P.O.:250] Out: 3400 [Urine:3400] Intake/Output this shift: Total I/O In: 350 [P.O.:350] Out: -   General appearance: alert, cooperative and no distress Resp: clear to auscultation bilaterally GI: soft, non-tender; bowel sounds normal; no masses,  no organomegaly and Incision is healing in nicely.  Lab Results:  Recent Labs    08/05/17 0839 08/06/17 0647  WBC 8.9 8.5  HGB 8.8* 8.4*  HCT 26.1* 25.6*  PLT 409* 373    BMET Recent Labs    08/04/17 0408 08/05/17 0433  NA 139  139 139  K 3.9  3.9 4.2  CL 100*  101 106  CO2 25  26 24   GLUCOSE 96  96 97  BUN 58*  57* 66*  CREATININE 9.08*  9.12* 9.57*  CALCIUM 8.7*  8.7* 8.7*   PT/INR No results for input(s): LABPROT, INR in the last 72 hours.  Recent Labs  Lab 08/01/17 0428 08/02/17 0434 08/03/17 0357 08/04/17 0408 08/05/17 0433  ALBUMIN  2.4* 2.3* 2.3* 2.2* 2.3*     Lipase     Component Value Date/Time   LIPASE 329 (H) 07/22/2017 0802     Medications: . amoxicillin-clavulanate  1 tablet Oral Q24H  . calcium acetate  1,334 mg Oral TID WC  . Chlorhexidine Gluconate Cloth  6 each Topical Q0600  . darbepoetin (ARANESP) injection - DIALYSIS  100 mcg Intravenous Q Tue-HD  . feeding supplement  1 Container Oral BID BM  . ferrous gluconate  324 mg Oral QPC supper  . heparin injection (subcutaneous)  5,000 Units Subcutaneous Q8H  . multivitamin  1 tablet Oral QHS  . pantoprazole  40 mg Oral Daily  . protein supplement shake  11 oz Oral Q24H  . saccharomyces boulardii  250 mg Oral BID    Assessment/Plan Prior h/o GI bleed with no known cause, workup unrevealing  Complexintra-abdominal abscess S/pex lapSBR and drainage of intra-abdominal abscess5/10 Dr. 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; TRANSMURAL DEFECT;INFLAMED OMENTUM;THE SURGICAL RESECTION MARGINS ARE NEGATIVE FOR TUMOR - VACd/c 5/17, now W2D dressing changes- wound may be starting to hypergranulate at inferior aspect -toleratingsoft dietand having bowel function - CT 5/17 showedRound intermediate density collection in the deep RIGHT pelvis, abscess vs normal postop fluid;No upper abdomen complication following abscess/gastrointestinal stromal tumor resection from small bowel; no leak ABL anemia-2U PRBC's 5/22.Hgb7.5 yesterday, VSS - cbc pending today - started SQ heparin  yesterday HCAP-WBC8.6 yesterday,afebrile last 24h, will transition to PO abx - CXR5/29 stable - recheck in AM   ARF -appreciate nephrology's assistance -HD started on 5/20- working on arranging OP HD -cr9.57, [id number]. GFR6 -UOP2.5 Lin 24 h  -  Vas cath pulled yesterday after HD - watching progress and need for further HD   ID -zosyn5/8>>5/24; levaquin 5/26; cefepime 5/27>> VTE -SCDs, SQ heparin  FEN -renal  diet, 2000 cc fluid restriction/ Boost Foley -d/c 5/15, strict I&Os Follow up -Dr. Redmond Pulling  Plan: Continue current treatments.  I am going to let him shower with the incision open.  Will follow and defer to renal for the next step. Chest x-ray just completed has not been read yet.  I do not see any acute changes he does have a small pleural effusion on the left.  He is using I-S.       LOS: 24 days    Saleha Kalp 08/06/[id number]

## 2017-08-07 LAB — RENAL FUNCTION PANEL
Albumin: 2.6 g/dL — ABNORMAL LOW (ref 3.5–5.0)
Anion gap: 10 (ref 5–15)
BUN: 36 mg/dL — ABNORMAL HIGH (ref 6–20)
CO2: 28 mmol/L (ref 22–32)
Calcium: 9.3 mg/dL (ref 8.9–10.3)
Chloride: 105 mmol/L (ref 101–111)
Creatinine, Ser: 6.26 mg/dL — ABNORMAL HIGH (ref 0.61–1.24)
GFR calc Af Amer: 12 mL/min — ABNORMAL LOW (ref >60)
GFR calc non Af Amer: 11 mL/min — ABNORMAL LOW (ref >60)
Glucose, Bld: 95 mg/dL (ref 65–99)
Phosphorus: 4.7 mg/dL — ABNORMAL HIGH (ref 2.5–4.6)
Potassium: 3.9 mmol/L (ref 3.5–5.1)
Sodium: 143 mmol/L (ref 135–145)

## 2017-08-07 NOTE — Progress Notes (Signed)
Subjective: Interval History: has no complaint of fells better, less swollen.  Objective: Vital signs in last 24 hours: Temp:  [98.5 F (36.9 C)-99.1 F (37.3 C)] 98.9 F (37.2 C) (06/02 0509) Pulse Rate:  [81-84] 81 (06/02 0509) Resp:  [16-17] 17 (06/02 0509) BP: (142-147)/(97-99) 145/99 (06/02 0509) SpO2:  [97 %-100 %] 97 % (06/02 0509) Weight change:   Intake/Output from previous day: 06/01 0701 - 06/02 0700 In: 1070 [P.O.:1070] Out: 3600 [Urine:3600] Intake/Output this shift: No intake/output data recorded.  General appearance: alert, cooperative, no distress and pale Resp: clear to auscultation bilaterally Cardio: regular rate and rhythm, S1, S2 normal, no murmur, click, rub or gallop GI: mild distension, pos bs Extremities: edema 1-2+  Lab Results: Recent Labs    08/05/17 0839 08/06/17 0647  WBC 8.9 8.5  HGB 8.8* 8.4*  HCT 26.1* 25.6*  PLT 409* 373   BMET:  Recent Labs    08/06/17 0647 08/07/17 0251  NA 143 143  K 4.2 3.9  CL 105 105  CO2 29 28  GLUCOSE 99 95  BUN 37* 36*  CREATININE 6.03* 6.26*  CALCIUM 8.8* 9.3   No results for input(s): PTH in the last 72 hours. Iron Studies: No results for input(s): IRON, TIBC, TRANSFERRIN, FERRITIN in the last 72 hours.  Studies/Results: Dg Chest Port 1 View  Result Date: 08/06/2017 CLINICAL DATA:  Acute kidney injury. Patient admitted with intra-abdominal abscess 07/13/2017. EXAM: PORTABLE CHEST 1 VIEW COMPARISON:  Single-view of the chest 08/03/2017. PA and lateral chest 07/31/2016. FINDINGS: Small left pleural effusion and left basilar airspace disease persists but shows mild improvement. The right lung is clear. Heart size is normal. No pneumothorax. IMPRESSION: Left basilar airspace disease and left diffusion show slight improvement since yesterday's examination. No new abnormality. Electronically Signed   By: Inge Rise M.D.   On: 08/06/2017 09:18    I have reviewed the patient's current  medications.  Assessment/Plan: 1 AKI Cr stable off HD indicating he has some function. Nonoliguric, mild vol xs. Contrast, low bps, meds 2 anemia getting Fe 3 GIST 4 HTN allow to diurese to lower bp P follow Cr, no ivf, cont diet, meds    LOS: 25 days   Jeneen Rinks Danne Vasek 08/07/2017,7:44 AM

## 2017-08-07 NOTE — Progress Notes (Signed)
23 Days Post-Op    CC:  GI bleed/acute renal failure  Subjective: He looks good this a.m.  No surgical issues.  Midline incision is clean and granulating well still has a little bit of drainage on the dressing.  Tolerating his diet well.  Objective: Vital signs in last 24 hours: Temp:  [98.5 F (36.9 C)-99.1 F (37.3 C)] 98.9 F (37.2 C) (06/02 0509) Pulse Rate:  [81-84] 81 (06/02 0509) Resp:  [16-17] 17 (06/02 0509) BP: (142-147)/(97-99) 145/99 (06/02 0509) SpO2:  [97 %-100 %] 97 % (06/02 0509) Last BM Date: 08/05/17 1070 PO Urine 3600 Afebrile, VSS Creatinine 6.26 this AM Intake/Output from previous day: 06/01 0701 - 06/02 0700 In: 1070 [P.O.:1070] Out: 3600 [Urine:3600] Intake/Output this shift: No intake/output data recorded.  General appearance: alert, cooperative and no distress Resp: clear to auscultation bilaterally GI: soft, non-tender; bowel sounds normal; no masses,  no organomegaly and Midline incision is healing nicely.  Good red granular tissue with some drainage on the dressing.  Lab Results:  Recent Labs    08/05/17 0839 08/06/17 0647  WBC 8.9 8.5  HGB 8.8* 8.4*  HCT 26.1* 25.6*  PLT 409* 373    BMET Recent Labs    08/06/17 0647 08/07/17 0251  NA 143 143  K 4.2 3.9  CL 105 105  CO2 29 28  GLUCOSE 99 95  BUN 37* 36*  CREATININE 6.03* 6.26*  CALCIUM 8.8* 9.3   PT/INR No results for input(s): LABPROT, INR in the last 72 hours.  Recent Labs  Lab 08/03/17 0357 08/04/17 0408 08/05/17 0433 08/06/17 0647 08/07/17 0251  ALBUMIN 2.3* 2.2* 2.3* 2.5* 2.6*     Lipase     Component Value Date/Time   LIPASE 329 (H) 07/22/2017 0802     Medications: . amoxicillin-clavulanate  1 tablet Oral Q24H  . calcium acetate  1,334 mg Oral TID WC  . darbepoetin (ARANESP) injection - DIALYSIS  100 mcg Intravenous Q Tue-HD  . heparin injection (subcutaneous)  5,000 Units Subcutaneous Q8H  . multivitamin  1 tablet Oral QHS  . pantoprazole  40 mg  Oral Daily  . protein supplement shake  11 oz Oral TID BM  . saccharomyces boulardii  250 mg Oral BID    Assessment/Plan  Prior h/o GI bleed with no known cause, workup unrevealing  Complexintra-abdominal abscess S/pex lapSBR and drainage of intra-abdominal abscess5/10 Dr. 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; TRANSMURAL DEFECT;INFLAMED OMENTUM;THE SURGICAL RESECTION MARGINS ARE NEGATIVE FOR TUMOR - VACd/c 5/17, now W2D dressing changes- wound may be starting to hypergranulate at inferior aspect -toleratingsoft dietand having bowel function - CT 5/17 showedRound intermediate density collection in the deep RIGHT pelvis, abscess vs normal postop fluid;No upper abdomen complication following abscess/gastrointestinal stromal tumor resection from small bowel; no leak ABL anemia-2U PRBC's 5/22.Hgb7.5yesterday, VSS -cbc pending today - started SQ heparin yesterday HCAP-WBC8.6 yesterday,afebrile last 24h,will transition to PO abx - CXR5/29 stable - recheck in AM   ARF -appreciate nephrology's assistance -HD started on 5/20- working on arranging OP HD -cr9.57, [id number]. GFR6 -UOP2.5 Lin 24 h  -  Vas cath pulled 5/31 after HD - watching progress and need for further HD   ID -zosyn5/8>>5/24; levaquin 5/26; cefepime 5/27>> VTE -SCDs, SQ heparin  FEN -renal diet, 2000 cc fluid restriction/ Boost Foley -d/c 5/15, strict I&Os Follow up -Dr. Redmond Pulling  Plan: He is ready for discharge from surgical standpoint;  awaiting  renal issues to stabilize and renal decision on  further dialysis.       LOS: 25 days    Joe Morris 08/07/2017 [phone]

## 2017-08-08 ENCOUNTER — Encounter (HOSPITAL_COMMUNITY): Payer: Self-pay | Admitting: Interventional Radiology

## 2017-08-08 LAB — RENAL FUNCTION PANEL
Albumin: 3.1 g/dL — ABNORMAL LOW (ref 3.5–5.0)
Anion gap: 12 (ref 5–15)
BUN: 36 mg/dL — ABNORMAL HIGH (ref 6–20)
CO2: 28 mmol/L (ref 22–32)
Calcium: 9.9 mg/dL (ref 8.9–10.3)
Chloride: 105 mmol/L (ref 101–111)
Creatinine, Ser: 5.52 mg/dL — ABNORMAL HIGH (ref 0.61–1.24)
GFR calc Af Amer: 14 mL/min — ABNORMAL LOW (ref >60)
GFR calc non Af Amer: 12 mL/min — ABNORMAL LOW (ref >60)
Glucose, Bld: 100 mg/dL — ABNORMAL HIGH (ref 65–99)
Phosphorus: 4.2 mg/dL (ref 2.5–4.6)
Potassium: 4.2 mmol/L (ref 3.5–5.1)
Sodium: 145 mmol/L (ref 135–145)

## 2017-08-08 MED ORDER — DARBEPOETIN ALFA 100 MCG/0.5ML IJ SOSY: 100.0000 ug | PREFILLED_SYRINGE | Freq: Once | INTRAMUSCULAR | Status: DC

## 2017-08-08 MED ORDER — CALCIUM ACETATE (PHOS BINDER) 667 MG PO CAPS: 1334.0000 mg | ORAL_CAPSULE | Freq: Three times a day (TID) | ORAL | 0 refills | Status: AC

## 2017-08-08 MED ORDER — RENA-VITE PO TABS: 1.0000 | ORAL_TABLET | Freq: Every day | ORAL | 0 refills | Status: DC

## 2017-08-08 MED ORDER — ONDANSETRON 4 MG PO TBDP: 4.0000 mg | ORAL_TABLET | Freq: Four times a day (QID) | ORAL | 0 refills | Status: DC | PRN

## 2017-08-08 MED ORDER — PANTOPRAZOLE SODIUM 40 MG PO TBEC: 40.0000 mg | DELAYED_RELEASE_TABLET | Freq: Every day | ORAL | 0 refills | Status: DC

## 2017-08-08 NOTE — Progress Notes (Signed)
Patient ID: Joe Morris, male   DOB: 1980-10-30, 37 y.o.   MRN: [id number] [facility] Progress Note   Assessment/ Plan:   1. Acute kidney injury: Suspected to be multifactorial ATN-hemodynamic following abdominal surgery/vancomycin/intravenous contrast.  Excellent urine output with evidence of renal recovery as seen by improving creatinine overnight.  The plan is to continue to follow him without any additional dialysis at this point.  Stable enough for discharge home from renal standpoint and we will set him up for labs/follow-up. Labs on 08/11/17. Follow up with me on 09/20/17- 1PM 2.  Status post exploratory laparotomy for small bowel resection/complex intra-abdominal abscess: Pathological evaluation revealing gastrointestinal stromal tumor with clear margins of excision.  On Augmentin. 3.  Acute blood loss anemia: Status post intravenous iron, continue to monitor 4.  Healthcare associated pneumonia: Clinically improved with transition from intravenous cefepime to oral Augmentin..   Subjective:   Reports to be feeling better, continues to have intermittent nausea and concerned about elevated blood pressure.   Objective:   BP (!) 144/100 (BP Location: Right Arm)   Pulse 87   Temp 98.9 F (37.2 C) (Oral)   Resp 17   Ht 5\' 7"  (1.702 m)   Wt 77.1 kg (169 lb 15.6 oz)   SpO2 97%   BMI 26.62 kg/m   Intake/Output Summary (Last 24 hours) at 08/08/2017 1011 Last data filed at 08/08/2017 8756 Gross per 24 hour  Intake 1240 ml  Output 3650 ml  Net -2410 ml   Weight change:   Physical Exam: Gen: Comfortably sitting up in recliner, reading  CVS: Pulse regular rhythm, normal rate, s1 and s2 Resp: Clear to auscultation bilaterally, no rales/rhonchi Abd: Soft, nontender, midline wound clean with good granulation Ext: Trace-1+ ankle edema, 1+ edema over lower back  Imaging: No results found.  Labs: BMET Recent Labs  Lab 08/02/17 0434 08/03/17 0357 08/04/17 0408  08/05/17 0433 08/06/17 0647 08/07/17 0251 08/08/17 0734  NA 135  133* 136 139  139 139 143 143 145  K 4.6  4.6 4.2 3.9  3.9 4.2 4.2 3.9 4.2  CL 96*  96* 97* 100*  101 106 105 105 105  CO2 22  20* 26 25  26 24 29 28 28   GLUCOSE 107*  102* 102* 96  96 97 99 95 100*  BUN 78*  78* 52* 58*  57* 66* 37* 36* 36*  CREATININE 11.08*  11.17* 8.05* 9.08*  9.12* 9.57* 6.03* 6.26* 5.52*  CALCIUM 8.7*  8.6* 8.5* 8.7*  8.7* 8.7* 8.8* 9.3 9.9  PHOS 10.2* 5.7* 6.5* 6.2* 4.2 4.7* 4.2   CBC Recent Labs  Lab 08/03/17 0357 08/04/17 0408 08/05/17 0839 08/06/17 0647  WBC 10.4 8.6 8.9 8.5  HGB 8.2* 7.5* 8.8* 8.4*  HCT 23.9* 22.3* 26.1* 25.6*  MCV 88.8 89.2 89.1 90.8  PLT 325 308 409* 373    Medications:    . amoxicillin-clavulanate  1 tablet Oral Q24H  . calcium acetate  1,334 mg Oral TID WC  . darbepoetin (ARANESP) injection - DIALYSIS  100 mcg Intravenous Q Tue-HD  . heparin injection (subcutaneous)  5,000 Units Subcutaneous Q8H  . multivitamin  1 tablet Oral QHS  . pantoprazole  40 mg Oral Daily  . protein supplement shake  11 oz Oral TID BM  . saccharomyces boulardii  250 mg Oral BID   Elmarie Shiley, MD 08/08/2017, 10:11 AM

## 2017-08-08 NOTE — Progress Notes (Signed)
Patient ID: Joe Morris, male   DOB: 1980-09-28, 37 y.o.   MRN: [id number]    24 Days Post-Op  Subjective: Pt feels well today, except some nausea that he has been having intermittently.  It is controlled with zofran and mostly comes on an empty stomach.  Once he eats, it resolves.  Unclear if this is medication related, renal related, or post surgical.  Patient is ready to get out of here.  Objective: Vital signs in last 24 hours: Temp:  [97.8 F (36.6 C)-98.9 F (37.2 C)] 98.9 F (37.2 C) (06/03 0541) Pulse Rate:  [81-92] 87 (06/03 0541) Resp:  [17] 17 (06/03 0541) BP: (144-154)/(100-103) 144/100 (06/03 0541) SpO2:  [97 %-100 %] 97 % (06/03 0541) Last BM Date: 08/06/17  Intake/Output from previous day: 06/02 0701 - 06/03 0700 In: 1640 [P.O.:1640] Out: 4400 [Urine:4400] Intake/Output this shift: No intake/output data recorded.  PE: Abd: soft, appropriately tender, but minimal, open wound is healing well and almost closed down.  Dressing changed.  +BS  Lab Results:  Recent Labs    08/05/17 0839 08/06/17 0647  WBC 8.9 8.5  HGB 8.8* 8.4*  HCT 26.1* 25.6*  PLT 409* 373   BMET Recent Labs    08/06/17 0647 08/07/17 0251  NA 143 143  K 4.2 3.9  CL 105 105  CO2 29 28  GLUCOSE 99 95  BUN 37* 36*  CREATININE 6.03* 6.26*  CALCIUM 8.8* 9.3   PT/INR No results for input(s): LABPROT, INR in the last 72 hours. CMP     Component Value Date/Time   NA 143 08/07/2017 0251   K 3.9 08/07/2017 0251   CL 105 08/07/2017 0251   CO2 28 08/07/2017 0251   GLUCOSE 95 08/07/2017 0251   BUN 36 (H) 08/07/2017 0251   CREATININE 6.26 (H) 08/07/2017 0251   CALCIUM 9.3 08/07/2017 0251   PROT 5.3 (L) 07/22/2017 0802   ALBUMIN 2.6 (L) 08/07/2017 0251   AST 19 07/22/2017 0802   ALT 20 07/22/2017 0802   ALKPHOS 85 07/22/2017 0802   BILITOT 0.9 07/22/2017 0802   GFRNONAA 11 (L) 08/07/2017 0251   GFRAA 12 (L) 08/07/2017 0251   Lipase     Component Value Date/Time   LIPASE 329  (H) 07/22/2017 0802       Studies/Results: Dg Chest Port 1 View  Result Date: 08/06/2017 CLINICAL DATA:  Acute kidney injury. Patient admitted with intra-abdominal abscess 07/13/2017. EXAM: PORTABLE CHEST 1 VIEW COMPARISON:  Single-view of the chest 08/03/2017. PA and lateral chest 07/31/2016. FINDINGS: Small left pleural effusion and left basilar airspace disease persists but shows mild improvement. The right lung is clear. Heart size is normal. No pneumothorax. IMPRESSION: Left basilar airspace disease and left diffusion show slight improvement since yesterday's examination. No new abnormality. Electronically Signed   By: Inge Rise M.D.   On: 08/06/2017 09:18    Anti-infectives: Anti-infectives (From admission, onward)   Start     Dose/Rate Route Frequency Ordered Stop   08/05/17 1800  amoxicillin-clavulanate (AUGMENTIN) 500-125 MG per tablet 500 mg     1 tablet Oral Every 24 hours 08/05/17 1013 08/09/17 1759   08/02/17 1200  levofloxacin (LEVAQUIN) IVPB 500 mg  Status:  Discontinued     500 mg 100 mL/hr over 60 Minutes Intravenous Every 48 hours 07/31/17 1125 08/01/17 0921   08/01/17 2000  ceFEPIme (MAXIPIME) 1 g in sodium chloride 0.9 % 100 mL IVPB  Status:  Discontinued     1 g  200 mL/hr over 30 Minutes Intravenous Every 24 hours 08/01/17 0927 08/05/17 1013   08/01/17 1000  ceFEPIme (MAXIPIME) 1 g in sodium chloride 0.9 % 100 mL IVPB  Status:  Discontinued     1 g 200 mL/hr over 30 Minutes Intravenous Every 24 hours 08/01/17 0921 08/01/17 0927   08/01/17 1000  ceFEPIme (MAXIPIME) 1 g in sodium chloride 0.9 % 100 mL IVPB     1 g 200 mL/hr over 30 Minutes Intravenous  Once 08/01/17 0927 08/01/17 1117   07/31/17 1130  levofloxacin (LEVAQUIN) IVPB 750 mg     750 mg 100 mL/hr over 90 Minutes Intravenous STAT 07/31/17 1125 07/31/17 1406   07/26/17 1000  linezolid (ZYVOX) IVPB 600 mg  Status:  Discontinued     600 mg 300 mL/hr over 60 Minutes Intravenous Every 12 hours 07/26/17  0820 07/26/17 0820   07/16/17 1600  piperacillin-tazobactam (ZOSYN) IVPB 2.25 g  Status:  Discontinued     2.25 g 100 mL/hr over 30 Minutes Intravenous Every 8 hours 07/16/17 1213 07/29/17 1031   07/15/17 1445  vancomycin (VANCOCIN) IVPB 750 mg/150 ml premix  Status:  Discontinued     750 mg 150 mL/hr over 60 Minutes Intravenous To Surgery 07/15/17 1437 07/15/17 2041   07/14/17 2300  piperacillin-tazobactam (ZOSYN) IVPB 3.375 g  Status:  Discontinued     3.375 g 12.5 mL/hr over 240 Minutes Intravenous Every 8 hours 07/13/17 1930 07/14/17 0858   07/14/17 0900  piperacillin-tazobactam (ZOSYN) IVPB 3.375 g  Status:  Discontinued     3.375 g 12.5 mL/hr over 240 Minutes Intravenous Every 8 hours 07/14/17 0858 07/16/17 1213   07/14/17 0445  vancomycin (VANCOCIN) IVPB 750 mg/150 ml premix  Status:  Discontinued     750 mg 150 mL/hr over 60 Minutes Intravenous Every 8 hours 07/14/17 0431 07/16/17 0856   07/13/17 1900  piperacillin-tazobactam (ZOSYN) IVPB 3.375 g  Status:  Discontinued     3.375 g 12.5 mL/hr over 240 Minutes Intravenous Every 8 hours 07/13/17 1847 07/13/17 1930   07/13/17 1530  piperacillin-tazobactam (ZOSYN) IVPB 3.375 g     3.375 g 100 mL/hr over 30 Minutes Intravenous  Once 07/13/17 1515 07/13/17 1636       Assessment/Plan Complexintra-abdominal abscess S/pex lapSBR and drainage of intra-abdominal abscess5/10 Dr. 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  ?course duration  ARF -appreciate nephrology's assistance -HD started on 5/20- working on arranging OP HD -cr6.26 6/2, awaiting Cr today. UOP 4.4L yesterday - Vas cath pulled 5/31 after HD - watching progress and need for further HD   ID -zosyn5/8>>5/24; Augmentin 5/31 -->? Number of days total of treatment?? D8/? VTE -SCDs, SQ heparin  FEN -renaldiet, 2000 cc fluid restriction/Boost Foley -d/c 5/15, strict I&Os Follow up -Dr. Redmond Pulling  Plan: He is ready for discharge from surgical standpoint;  awaiting  renal issues to stabilize and renal decision on further dialysis.     LOS: 26 days    Henreitta Cea , [facility] 08/08/2017, 7:55 AM Pager: [phone]

## 2017-08-08 NOTE — Progress Notes (Signed)
Patient discharged to home. Verbalizes understanding of all discharge instructions including incision care, discharge medications, and follow up MD visits. Patient accompanied by father.

## 2017-08-08 NOTE — Discharge Summary (Signed)
Patient ID: Joe [facility] [id number] 1980-08-25 37 y.o.  Admit date: 07/13/2017 Discharge date: 08/08/2017  Admitting Diagnosis: Perforation of small bowel with intra-abdominal abscess  Discharge Diagnosis Patient Active Problem List   Diagnosis Date Noted  . Acute kidney injury ([place]) 07/17/2017  . Perforation of small intestine ([place]) 07/17/2017  . Intra-abdominal abscess ([place]) 07/13/2017    Consultants Nephrology   Reason for Admission: Joe [facility] is a 37yo male who presented to [facility] earlier today with 4 days of persistent abdominal pain, fevers, and chills. States that the pain started Saturday evening. He had run a 5K with his children earlier in the day and ate pancakes for breakfast and pizza for lunch. He did not eat dinner because he did not feel well. Denies any known trauma to his abdomen. The pain is mostly central and radiates slightly to the right. Pain is worse with palpation or sitting up. He has had 1 episode of n/v. Denies diarrhea, constipation, dysuria, or blood in stool. States that he has never had pain like this before.   In the ED patient was found to have a TMAX 100.5, WBC 11.8, lactic acid 1.89, hemoglobin 15.3. CT scan abdomen pelvis showed complex 7.4 x 5.1 x 4.3 cm abscess central/left paracentral aspect of the abdomen displacing proximal small bowel loops and reaching the undersurface of the transverse colon; prominent vascular blush along the posterior superior aspect. Patient denies any NSAID/aspirin use. He does not smoke. He drinks about 2 beers daily. He does not take any anticoagulants. No prior h/o abdominal surgery.  Of note, patient does have a prior history of upper GI bleed in 2016. He developed symptomatic anemia with melena. He underwent EGD and colonoscopy at [facility]. Per patient these procedures were normal and did not reveal the cause of his GI bleed. Bleeding resolved spontaneously.  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 Greer Pickerel on 07-15-17  Place a temporary HD catheter on 07-25-17  Hospital Course:  GIST tumor The patient was admitted secondary to what appeared to be a perforation of his small bowel with a large abscess.  Due to some concerns on his initial scan for blush and possible bleeding, a repeat scan was ordered the next day with oral and once again IV contrast.  He was already on zosyn for abx therapy.  The following scan showed a fistulous connection from the bowel to the abscess and already increasing in size.  The patient clinically appeared ill and was taken to the operating room where he underwent the above procedure.  He did have an abdominal wound VAC placed at the end of surgery.  He had a NGT placed and this was continued for 6 post op days.  His diet was able to be advanced as tolerated after this.  He did have some intermittent nausea since surgery that was controlled with zofran, whether that be secondary to his surgery, kidney function, or his medications.  He was on protonix throughout his stay.  After his ARF began his zosyn was changed to a renal dose.  He had a total of 16 days of zosyn.  His wound VAC was taken off after the second change as the wound was very shallow and it was painful.  Wet to dry dressing changes were initiated and continued throughout his stay.  On POD 24, day of discharge, his wound was almost completely  healed and needing just daily dressing changes.  The patient's pathology revealed a perforated GIST tumor as the source of his perforation and abscess.  His margins were negative.  HCAP The patient developed a fever on POD 16.  UA was negative, but CXR revealed an infiltrate c/w PNA.  He was started on Levaquin initially and then transitioned to Cefepime and lastly to augmentin.  He received 8 days of abx therapy for this overall.  He never had an elevation  of his WBCs and these remained normal.  His fevers dissipated and resolved with treatment.  ABL anemia The patient had some anemia post operatively.  He had some dark stools initially which were felt to be secondary to his bowel resection and anastomosis.  On 5/22, the patient's hgb was noted to be 7.4.  Because of his need for HD at the time, black stools, and decrease trend in his hgb, he was transfused 2 units of pRBCs.  His lovenox had been on hold as well by this time.  After transfusion his hgb stabilized and his stools were no longer black.  His DVT prophylaxis was restarted with no further issues on POD 18.  This was stable throughout the rest of his admission.  ARF Unfortunately, the patient developed acute renal failure while in the hospital likely secondary to multiple doses of IV contrast, sepsis, and other kidney insults.  His baseline creatinine was 1.24 on admission.  Unfortunately, postoperatively this climbed to 16.95.  Nephrology was consulted very early on and all nephrotoxic medications were discontinued and medications were renally adjusted.  Nephrology closely followed him each day.  Finally on POD 11, the patient underwent his first session of HD. This continued for 3 sessions and was then transitioned to prn.  He did not require any further HD sessions.  His creatinine stayed in the 8 range for several days and finally at the end of his stay began showing improvement and dropping on its own with increase in his GFR to 14 on day of discharge and Cr of 5.52 down from 6.26 the day prior.  He was making good UOP.  His temp HD cath was removed after about 10 days of placement.  No new catheter was placed.  On POD 24, the patient was stable from a renal standpoint for discharge home with close lab follow up.  On POD 24, the patient was medically and surgically stable for DC home with appropriate follow up in place.   Physical Exam: See note from earlier today  Allergies as of 08/08/2017    No Known Allergies     Medication List    STOP taking these medications   omeprazole 20 MG capsule Commonly known as:  PRILOSEC     TAKE these medications   calcium acetate 667 MG capsule Commonly known as:  PHOSLO Take 2 capsules (1,334 mg total) by mouth 3 (three) times daily with meals.   multivitamin Tabs tablet Take 1 tablet by mouth at bedtime.   ondansetron 4 MG disintegrating tablet Commonly known as:  ZOFRAN-ODT Take 1 tablet (4 mg total) by mouth every 6 (six) hours as needed for nausea.   pantoprazole 40 MG tablet Commonly known as:  PROTONIX Take 1 tablet (40 mg total) by mouth daily. Start taking on:  08/09/2017        Follow-up Information    Elmarie Shiley, MD Follow up on 09/20/2017.   Specialty:  Nephrology Why:  Appointment at 1 PM Contact information: [address]  [place] [address] [phone] He will have a BMET checked in their office on 6/6       Greer Pickerel, MD Follow up on 08/25/2017.   Specialty:  General Surgery Why:  2:15pm, arrive by 1:45pm for check in and paperwork. Contact information: [address] [place] [address] [phone]           Signed: Saverio Danker, [facility] Surgery 08/08/2017, 3:02 PM Pager: [phone]

## 2017-09-06 ENCOUNTER — Telehealth: Payer: Self-pay | Admitting: Hematology

## 2017-09-06 NOTE — Telephone Encounter (Signed)
A letter was not mailed to the pt due to the date of the appointment.

## 2017-09-06 NOTE — Telephone Encounter (Signed)
Pt has been scheduled to see Dr. Burr Medico on 7/5 at 915am. Pt aware to arrive 30 minutes early. Letter mailed.

## 2017-09-09 ENCOUNTER — Encounter: Payer: Self-pay | Admitting: Hematology

## 2017-09-09 ENCOUNTER — Telehealth: Payer: Self-pay | Admitting: Hematology

## 2017-09-09 ENCOUNTER — Inpatient Hospital Stay: Payer: 59

## 2017-09-09 ENCOUNTER — Inpatient Hospital Stay: Payer: 59 | Attending: Hematology | Admitting: Hematology

## 2017-09-09 VITALS — BP 138/75 | HR 70 | Temp 97.9°F | Resp 18 | Ht 67.0 in | Wt 163.3 lb

## 2017-09-09 DIAGNOSIS — K651 Peritoneal abscess: Secondary | ICD-10-CM

## 2017-09-09 DIAGNOSIS — D508 Other iron deficiency anemias: Secondary | ICD-10-CM | POA: Diagnosis not present

## 2017-09-09 DIAGNOSIS — R944 Abnormal results of kidney function studies: Secondary | ICD-10-CM

## 2017-09-09 DIAGNOSIS — C49A3 Gastrointestinal stromal tumor of small intestine: Secondary | ICD-10-CM

## 2017-09-09 DIAGNOSIS — I10 Essential (primary) hypertension: Secondary | ICD-10-CM | POA: Diagnosis not present

## 2017-09-09 DIAGNOSIS — Z87891 Personal history of nicotine dependence: Secondary | ICD-10-CM | POA: Insufficient documentation

## 2017-09-09 DIAGNOSIS — Z8042 Family history of malignant neoplasm of prostate: Secondary | ICD-10-CM

## 2017-09-09 LAB — COMPREHENSIVE METABOLIC PANEL
ALT: 21 U/L (ref 0–44)
AST: 20 U/L (ref 15–41)
Albumin: 4.2 g/dL (ref 3.5–5.0)
Alkaline Phosphatase: 97 U/L (ref 38–126)
Anion gap: 6 (ref 5–15)
BUN: 17 mg/dL (ref 6–20)
CO2: 29 mmol/L (ref 22–32)
Calcium: 9.7 mg/dL (ref 8.9–10.3)
Chloride: 105 mmol/L (ref 98–111)
Creatinine, Ser: 1.32 mg/dL — ABNORMAL HIGH (ref 0.61–1.24)
GFR calc Af Amer: >60 mL/min (ref >60)
GFR calc non Af Amer: >60 mL/min (ref >60)
Glucose, Bld: 100 mg/dL — ABNORMAL HIGH (ref 70–99)
Potassium: 4.3 mmol/L (ref 3.5–5.1)
Sodium: 140 mmol/L (ref 135–145)
Total Bilirubin: 0.2 mg/dL — ABNORMAL LOW (ref 0.3–1.2)
Total Protein: 7.1 g/dL (ref 6.5–8.1)

## 2017-09-09 LAB — CBC WITH DIFFERENTIAL (CANCER CENTER ONLY)
Basophils Absolute: 0.1 10*3/uL (ref 0.0–0.1)
Basophils Relative: 1 %
Eosinophils Absolute: 0.1 10*3/uL (ref 0.0–0.5)
Eosinophils Relative: 1 %
HCT: 32.1 % — ABNORMAL LOW (ref 38.4–49.9)
Hemoglobin: 11 g/dL — ABNORMAL LOW (ref 13.0–17.1)
Lymphocytes Relative: 20 %
Lymphs Abs: 1.3 10*3/uL (ref 0.9–3.3)
MCH: 31.7 pg (ref 27.2–33.4)
MCHC: 34.4 g/dL (ref 32.0–36.0)
MCV: 92.2 fL (ref 79.3–98.0)
Monocytes Absolute: 0.4 10*3/uL (ref 0.1–0.9)
Monocytes Relative: 7 %
Neutro Abs: 4.5 10*3/uL (ref 1.5–6.5)
Neutrophils Relative %: 71 %
Platelet Count: 290 10*3/uL (ref 140–400)
RBC: 3.48 MIL/uL — ABNORMAL LOW (ref 4.20–5.82)
RDW: 15.7 % — ABNORMAL HIGH (ref 11.0–14.6)
WBC Count: 6.4 10*3/uL (ref 4.0–10.3)

## 2017-09-09 NOTE — Progress Notes (Signed)
[facility]  [phone] [phone]  Clinic New Consult Note   Patient Care Team: [facility] as PCP - General (Nurse Practitioner) Joe Shiley, MD as Consulting Physician (Nephrology)   Date of Service:  09/09/2017  REFERRAL PHYSICIAN: Dr. Redmond [facility]   CHIEF COMPLAINTS/PURPOSE OF CONSULTATION:  GIST of small intestines  HISTORY OF PRESENTING ILLNESS:  [facility] 37 y.o. male is a here because of GIST of small intestines. The patient was referred by surgeon Dr. Redmond [facility]. The patient presents to the clinic today by himself.   He notes he was having fever and abdominal pain for 4 days before going to [facility]. He had scans which showed abscess in abdominal. He went for draining and during procedure he was found to have GIST tumor which had perforated. After surgery he went into acute renal failure and on dialysis for 2 weeks. He no longer has renal failure. He notes his Cr has dropped to 1 range. He is currently being followed by [facility]. He notes he also developed pneumonia after hospitalization. He was treated with antibiotics which was completed and now resolved. He notes having GI bleeding post surgery and was given iron pills in [facility], and IV iron during dialysis. He was on Protonix after surgery.   Today he notes he is doing well now, with adequate energy.  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 He went back to work last week. He smoked dipping/chewing Tobacco for 15 years and stopped 4 years ago. He was in the [employer] for 6 years.   He has a history of HTN and was previously on lisinopril. He notes a GI bleed with black stool in 2015 and GI workup was negative at [facility] (EGD, no capsule endoscopy). He notes his father has prostate cancer diagnosed at 16yo.     MEDICAL HISTORY:  Past Medical History:  Diagnosis Date  . Anemia 07/2014  . Gastrointestinal hemorrhage with melena    [facility]  07/26/2014 [facility])  . GERD (gastroesophageal reflux disease)   . Hypertension   . Intra-abdominal abscess ([facility])    [facility] 07/13/2017    SURGICAL HISTORY: Past Surgical History:  Procedure Laterality Date  . COLONOSCOPY WITH ESOPHAGOGASTRODUODENOSCOPY (EGD)  07/2014   "@ [facility]"  . IR FLUORO GUIDE CV LINE RIGHT  07/25/2017  . IR US GUIDE VASC ACCESS RIGHT  07/25/2017  . LAPAROSCOPY N/A 07/15/2017   Procedure: LAPAROSCOPY DIAGNOSTIC;  Surgeon: Joe Pickerel, MD;  Location: [facility];  Service: General;  Laterality: N/A;  . LAPAROTOMY N/A 07/15/2017   Procedure: EXPLORATORY LAPAROTOMY PROXIMAL SMALL BOWEL RESECTION, DRAINAGE OF ABDOMINAL ABSCESS;  Surgeon: Joe Pickerel, MD;  Location: [facility] OR;  Service: General;  Laterality: N/A;    SOCIAL HISTORY: Social History   Socioeconomic History  . Marital status: Married    Spouse name: Not on file  . Number of children: Not on file  . Years of education: Not on file  . Highest education level: Not on file  Occupational History  . Not on file  Social Needs  . Financial resource strain: Not on file  . Food insecurity:    Worry: Not on file    Inability: Not on file  . Transportation needs:    Medical: Not on file    Non-medical: Not on file  Tobacco Use  . Smoking status: Former Research scientist (life sciences)  . Smokeless tobacco: Never Used  . Tobacco comment: "smoked for  a couple months in the [employer]"  Substance and Sexual Activity  . Alcohol use: Yes    Alcohol/week: 4.2 oz    Types: 7 Cans of beer per week    Frequency: Never  . Drug use: Never  . Sexual activity: Yes  Lifestyle  . Physical activity:    Days per week: Not on file    Minutes per session: Not on file  . Stress: Not on file  Relationships  . Social connections:    Talks on phone: Not on file    Gets together: Not on file    Attends religious service: Not on file    Active member of club or organization: Not on file    Attends meetings of clubs or organizations: Not on file    Relationship  status: Not on file  . Intimate partner violence:    Fear of current or ex partner: Not on file    Emotionally abused: Not on file    Physically abused: Not on file    Forced sexual activity: Not on file  Other Topics Concern  . Not on file  Social History Narrative  . Not on file    FAMILY HISTORY: Family History  Problem Relation Age of Onset  . Cancer Father 1       prostate cancer     ALLERGIES:  is allergic to vancomycin.  MEDICATIONS:  Current Outpatient Medications  Medication Sig Dispense Refill  . Multiple Vitamin (MULTIVITAMIN WITH MINERALS) TABS tablet Take 1 tablet by mouth daily.    . ondansetron (ZOFRAN-ODT) 4 MG disintegrating tablet Take 1 tablet (4 mg total) by mouth every 6 (six) hours as needed for nausea. 30 tablet 0   No current facility-administered medications for this visit.     REVIEW OF SYSTEMS:   Constitutional: Denies fevers, chills or abnormal night sweats Eyes: Denies blurriness of vision, double vision or watery eyes Ears, nose, mouth, throat, and face: Denies mucositis or sore throat Respiratory: Denies cough, dyspnea or wheezes Cardiovascular: Denies palpitation, chest discomfort or lower extremity swelling Gastrointestinal:  Denies nausea, heartburn or change in bowel habits Skin: Denies abnormal skin rashes Lymphatics: Denies new lymphadenopathy or easy bruising Neurological:Denies numbness, tingling or new weaknesses Behavioral/Psych: Mood is stable, no new changes  All other systems were reviewed with the patient and are negative.  PHYSICAL EXAMINATION: ECOG PERFORMANCE STATUS: 0 - Asymptomatic  Vitals:   09/09/17 0916  BP: 138/75  Pulse: 70  Resp: 18  Temp: 97.9 F (36.6 C)  SpO2: 100%   Filed Weights   09/09/17 0916  Weight: 163 lb 4.8 oz (74.1 kg)    GENERAL:alert, no distress and comfortable SKIN: skin color, texture, turgor are normal, no rashes or significant lesions EYES: normal, conjunctiva are pink and  non-injected, sclera clear OROPHARYNX:no exudate, no erythema and lips, buccal mucosa, and tongue normal  NECK: supple, thyroid normal size, non-tender, without nodularity LYMPH:  no palpable lymphadenopathy in the cervical, axillary or inguinal LUNGS: clear to auscultation and percussion with normal breathing effort HEART: regular rate & rhythm and no murmurs and no lower extremity edema ABDOMEN:abdomen soft, non-tender and normal bowel sounds Musculoskeletal:no cyanosis of digits and no clubbing  PSYCH: alert & oriented x 3 with fluent speech NEURO: no focal motor/sensory deficits  LABORATORY DATA:  I have reviewed the data as listed CBC Latest Ref Rng & Units 08/06/2017 08/05/2017 08/04/2017  WBC 4.0 - 10.5 K/uL 8.5 8.9 8.6  Hemoglobin 13.0 - 17.0 g/dL 8.4(L)  8.8(L) 7.5(L)  Hematocrit 39.0 - 52.0 % 25.6(L) 26.1(L) 22.3(L)  Platelets 150 - 400 K/uL 373 409(H) 308    CMP Latest Ref Rng & Units 08/08/2017 08/07/2017 08/06/2017  Glucose 65 - 99 mg/dL 100(H) 95 99  BUN 6 - 20 mg/dL 36(H) 36(H) 37(H)  Creatinine 0.61 - 1.24 mg/dL 5.52(H) 6.26(H) 6.03(H)  Sodium 135 - 145 mmol/L 145 143 143  Potassium 3.5 - 5.1 mmol/L 4.2 3.9 4.2  Chloride 101 - 111 mmol/L 105 105 105  CO2 22 - 32 mmol/L _0 Calcium 8.9 - 10.3 mg/dL 9.9 9.3 8.8(L)  Total Protein 6.5 - 8.1 g/dL - - -  Total Bilirubin 0.3 - 1.2 mg/dL - - -  Alkaline Phos 38 - 126 U/L - - -  AST 15 - 41 U/L - - -  ALT 17 - 63 U/L - - -     PATHOLOGY  EXPLORATORY LAPAROTOMY PROXIMAL SMALL BOWEL RESECTION, DRAINAGE OF ABDOMINAL ABSCESS Diagnosis 07/15/17 Small intestine, resection, Proximal Jejunum - GASTROINTESTINAL STROMAL TUMOR (GIST), SPANNING 1.7 CM - TRANSMURAL DEFECT - INFLAMED OMENTUM. - THE SURGICAL RESECTION MARGINS ARE NEGATIVE FOR TUMOR. - SEE ONCOLOGY TABLE BELOW. Microscopic Comment GASTROINTESTINAL STROMAL TUMOR (GIST) Procedure: Proximal jejunum resection Tumor Site: Proximal jejunum Tumor Size: 1.7 cm (gross  measurement). Tumor Focality: Unifocal Histologic Type: Gastrointestinal stromal tumor Mitotic Rate: Less than 5 per 50 high power field Histologic Grade: Low grade Margins: Greater than 2.0 cm to all margins Regional Lymph Nodes: None examined. Pathologic Stage Classification (pTNM, AJCC 8th Edition): pT1, pNX Ancillary Studies: The tumor cells are positive for CD117, CD34 and SMA. They are negative for desmin and S100. Additional studies can be performed upon clinician request. Treatment effect: N/A Representative tumor block: 1H 1 of 3 Amended copy Amended FINAL for [facility], [facility] [phone]) Microscopic Comment(continued) COMMENT: Following the initial diagnosis and signout, the case was discussed with Dr. Greer [facility] who offered additional clinical information. The gross specimen was re-examined and the tumor nodule and transmural defect were identified. The case was discussed with Dr. Redmond [facility] again on 07/21/2017. (JBK:kh 07/21/17)   RADIOGRAPHIC STUDIES: I have personally reviewed the radiological images as listed and agreed with the findings in the report. No results found.    CT AP W WO Contrast 07/22/17  IMPRESSION: 1. Round intermediate density collection in the deep RIGHT pelvis anterior to the rectum and superior to the seminal vesicles. Different differential include sterile postsurgical fluid versus abscess. Collection does have mild mass effect upon adjacent bowel suggesting organization (abscess). A contrast enhanced CT may differentiate when renal function permits. 2. No upper abdomen complication following abscess/gastrointestinal stromal tumor resection from small bowel. 3. No evidence of bowel obstruction or leak of oral contrast with oral contrast transiting to the rectum. 4. Small bilateral pleural effusions.   CT AP W contrast 07/13/17  IMPRESSION: Complex 7.4 x 5.1 x 4.3 cm abscess central/left paracentral aspect of the abdomen displacing proximal  small bowel loops and reaching the undersurface of the transverse colon. Prominent vascular blush along the posterior superior aspect. Significant inflammation of surrounding fat planes. Etiology indeterminate. The most abnormal appearing bowel is the proximal small bowel. This abscess may reflect small bowel perforation, possibly from ingested material, underlying inflammatory process and less likely (although not excluded), underlying mass. I suspect involvement of the undersurface of the transverse colon is secondary. Adjacent lymph nodes probably reactive in origin. Mass effect upon the superior mesenteric vein which is narrowed. Enlarged liver spanning  over 19.2 cm.     ASSESSMENT & PLAN:  [facility] is a 37 y.o. male with no significant medical history.    1. GIST of small intestine, pT1NxM0, stage I, low grade  -I reviewed his CT scans and surgical pathology results. He is status post laparoscopic surgery on 07/15/17.  -He had a 1.7 cm just in the proximal jejunum, which was completely resected.  This is a low grade tumor with a mitotic rate of 4.  Overall such small and low grade GIST has excellent prognosis with very low risk of recurrence. However his tumor perforated and formed an abscessed this potentially can cause the tumor cells to spread into peritonum and increase risk of recurrence.  Tumor perforation is an independent risk for recurrence, in addition to tumor size and grade. GISTwith perforation is considered as intermedia to high risk features, and patients with tumor perforation were included in the adjuvant imatinib trial for GIST, such as EORTC 06301 and SSG XVIII trial.  -On above, I recommend adjuvant imatinib for 3 years to reduce his risk of recurrence.  I will check his tumor for KIT and PDGFRA mutation, which predicts benefit of adjuvant imatinib.  -I discussed the side effects of Gleevec including but not limited to: fatigue, lower blood counts, diarrhea,  gastric discomfort, and N&V. I provided him with reading material on this medication. He is interested in trying it. I will prescribe today for him to fill later when KIT mutation result is back.  -I discussed GIST surveillance with regular lab, office visits and surveillance CT scans for up to 5 years.  We discussed the rate late recurrence from GIST.  I advised him to watch for unexpected significant abdominal pain, bloating, ascites, or GI bleeding as a sign for recurrence.  We also discussed the risk of second GIST.  -Will do lab today for baseline kidney function and blood counts.   2. Acute Renal Failure -Occurred after surgery. He was on dialysis for 2 weeks, off now. His kidney function is being closely followed as he recovers. Managed by [facility].  -will repeat CMP today  3. Anemia -Anemia is secondary to post surgical GI bleeding which has since resolved. He was previously treated with oral and IV iron. His anemia is being monitored by his PCP Dr. Barrie Folk -will repeat CBC today    PLAN:  Lab today Lab and f/u in 2 months  Prescribe Gleevec 410m daily to possibly start in 2-3 weeks when his tumor KIT and PDGFRA mutation result returns. I will request the test today and call him with the results.    All questions were answered. The patient knows to call the clinic with any problems, questions or concerns. I spent 30 minutes counseling the patient face to face. The total time spent in the appointment was 40 minutes and more than 50% was on counseling.     YTruitt Merle MD 09/09/2017 10:14 AM  I, AJoslyn Devon am acting as scribe for YTruitt Merle MD.   I have reviewed the above documentation for accuracy and completeness, and I agree with the above.

## 2017-09-09 NOTE — Telephone Encounter (Signed)
Scheduled appt per 7/5 los - gave patient aVS and calender per los.  

## 2017-09-10 ENCOUNTER — Encounter: Payer: Self-pay | Admitting: Hematology

## 2017-09-12 ENCOUNTER — Other Ambulatory Visit (HOSPITAL_COMMUNITY)
Admission: RE | Admit: 2017-09-12 | Discharge: 2017-09-12 | Disposition: A | Discharge status: Home / Self Care | Payer: 59 | Source: Ambulatory Visit | Attending: Hematology | Admitting: Hematology

## 2017-09-12 DIAGNOSIS — C49A3 Gastrointestinal stromal tumor of small intestine: Secondary | ICD-10-CM | POA: Insufficient documentation

## 2017-09-13 ENCOUNTER — Telehealth: Payer: Self-pay

## 2017-09-13 NOTE — Telephone Encounter (Signed)
Faxed lab results to Dr. Elmarie Shiley at [facility] at [phone]

## 2017-09-13 NOTE — Telephone Encounter (Signed)
Left voice message to notify patient of lab results (BUN, Creatinine, Hbg) have faxed his labs to Dr. Posey Pronto at [facility].  Encouraged patient to call back if he has any questions.

## 2017-09-13 NOTE — Telephone Encounter (Signed)
-----   Message from [name], MD sent at 09/12/2017  7:43 AM EDT ----- Please let pt know his lab results, and fax it to his nephrologist Dr. Posey Pronto, thanks   [name]  09/12/2017

## 2017-09-14 ENCOUNTER — Other Ambulatory Visit: Payer: Self-pay | Admitting: Hematology

## 2017-09-14 MED ORDER — IMATINIB MESYLATE 400 MG PO TABS: 400.0000 mg | ORAL_TABLET | Freq: Every day | ORAL | 1 refills | Status: DC

## 2017-09-15 ENCOUNTER — Telehealth: Payer: Self-pay | Admitting: Pharmacist

## 2017-09-15 DIAGNOSIS — C49A0 Gastrointestinal stromal tumor, unspecified site: Secondary | ICD-10-CM

## 2017-09-15 NOTE — Telephone Encounter (Signed)
Oral Oncology Pharmacist Encounter  Received new prescription for Gleevec (imatinib) for the treatment of newly diagnosed gastrointestinal stromal tumor of the small intestine, status post resection on 07/15/2017, planned duration 3-5 years of total therapy to reduce risk of recurrence. Awaiting mutational analysis to ensure response to adjuvant Gleevec prior to initiation of therapy  Labs from 09/09/17 assessed, OK for treatment. SCr=1.32, est CrCl ~80 mL/min, patient's renal function greatly improved from post-op AKI  Current medication list in Epic reviewed, no DDIs with Gleevec identified.  Prescription will be sent to appropriate dispensing pharmacy with insurance network once insurance authorization is obtained.  Oral Oncology Clinic will continue to follow for insurance authorization, copayment issues, initial counseling and start date.  Johny Drilling, PharmD, BCPS, BCOP  09/15/2017 3:44 PM Oral Oncology Clinic [phone]

## 2017-09-16 ENCOUNTER — Telehealth: Payer: Self-pay | Admitting: Pharmacist

## 2017-09-16 NOTE — Telephone Encounter (Signed)
Oral Oncology Pharmacist Encounter  Prior authorization for [place] (imatinib) has been submitted on Cover My Meds Key: [id number] Status is pending, may take 72 business hours for determination.  Johny Drilling, PharmD, BCPS, BCOP  09/16/2017 8:46 AM Oral Oncology Clinic [phone]

## 2017-09-16 NOTE — Telephone Encounter (Signed)
Oral Oncology Pharmacist Encounter  Received notification of administration insurance denial for Gleevec 400mg  tablets stating "the requested medication and/or diagnosis are not a covered benefit and are excluded from coverage in accordance with the terms and conditions of your plan benefit."  I called provided phone number of [phone] for additional information about denial as Gleevec is FDA indicated for the treatment of GIST.  Per representative, they do cover generic imatinib 400mg  tablets.  New A request submitted over the phone [id number] GIST is not an option for [place] approval Package insert for [place] (imatnib) showing GIST as indication faxed for review to [phone], per representative suggestion  PA is pending.  This encounter will continue to be updated until final determination.  Johny Drilling, PharmD, BCPS, BCOP  09/16/2017 10:01 AM Oral Oncology Clinic [phone]

## 2017-09-16 NOTE — Telephone Encounter (Signed)
Oral Oncology Pharmacist Encounter  Received notification from [place] healthcare that insurance authorization for imatinib for the use of gastrointestinal stromal tumor has been approved  File ID: [id number] Effective dates: 09/16/2017-09/17/2018  Prescription must be filled through preferred specialty pharmacy per insurance requirement. Prescription for imatinib will be sent to [facility] upon receipt of mutational analysis.  Johny Drilling, PharmD, BCPS, BCOP  09/16/2017 2:16 PM Oral Oncology Clinic [phone]

## 2017-09-23 ENCOUNTER — Encounter (HOSPITAL_COMMUNITY): Payer: Self-pay | Admitting: Hematology

## 2017-09-23 MED ORDER — IMATINIB MESYLATE 400 MG PO TABS: 400.0000 mg | ORAL_TABLET | Freq: Every day | ORAL | 1 refills | Status: DC

## 2017-09-23 NOTE — Telephone Encounter (Signed)
Oral Chemotherapy Pharmacist Encounter   I spoke with patient for overview of Gleevec (imatinib).   We discussed resulting c-kit mutational analysis which shows benefit of [place] for patient.  Counseled patient on administration, dosing, side effects, monitoring, drug-food interactions, safe handling, storage, and disposal.  Patient will take Gleevec 432m tablets, 1 tablets (4046m by mouth once daily with a meal and a large glass of water.  Patient knows food may decrease stomach irritation and to maintain hydration while on treatment with Gleevec.  Patient counseled to avoid grapefruit and grapefruit juice.  Gleevec start date: TBD, week of 09/26/2017  Adverse effects include but are not limited to: nausea, vomiting, diarrhea, fatigue, muscle cramps, lower extremity edema, rash, decreased blood counts, GI bleeding, and cardiac dysfunction.  Patient has anti-emetic on hand and knows to take it if nausea develops.   Patient will obtain anti diarrheal and alert the office of 4 or more loose stools above baseline.  Reviewed with patient importance of keeping a medication schedule and plan for any missed doses.  Medication reconciliation performed and medication/allergy list updated.  Patient informed that we have obtained [facility] [phone] insurance requirement.  Patient instructed dispensing pharmacy will reach out to him with copayment information and to set-up shipment of his GlHermosaPatient instructed to call [facility] on Tuesday (09/27/2017) for status check if he has not yet heard from them.  Patient instructed to reach out to oral oncology clinic if any issues arise with medication acquisition.  All questions answered.  Mr. DuHaasoiced understanding and appreciation.   Patient knows to call the office with questions or concerns. Oral Oncology Clinic will continue to  follow.  Thank you,  JeJohny DrillingPharmD, BCPS, BCOP  09/23/2017   1:24 PM Oral Oncology Clinic [phone]

## 2017-09-30 ENCOUNTER — Encounter (HOSPITAL_COMMUNITY): Payer: Self-pay | Admitting: Hematology

## 2017-10-03 NOTE — Telephone Encounter (Signed)
Oral Oncology Patient Advocate Encounter  I received notification from [place] that [place] was delivered to patient 09/29/17  [place] Patient [place] Phone [phone] Fax [phone]

## 2017-10-19 ENCOUNTER — Other Ambulatory Visit: Payer: Self-pay | Admitting: General Surgery

## 2017-10-19 DIAGNOSIS — M5431 Sciatica, right side: Secondary | ICD-10-CM

## 2017-11-04 ENCOUNTER — Ambulatory Visit
Admission: RE | Admit: 2017-11-04 | Discharge: 2017-11-04 | Disposition: A | Discharge status: Home / Self Care | Payer: 59 | Source: Ambulatory Visit | Attending: General Surgery | Admitting: General Surgery

## 2017-11-04 DIAGNOSIS — M5431 Sciatica, right side: Secondary | ICD-10-CM

## 2017-11-09 ENCOUNTER — Other Ambulatory Visit: Payer: Self-pay | Admitting: Hematology

## 2017-11-09 DIAGNOSIS — C49A0 Gastrointestinal stromal tumor, unspecified site: Secondary | ICD-10-CM

## 2017-11-09 NOTE — Progress Notes (Signed)
[facility]  [phone] [phone]  Clinic Follow up Note   Patient Care Team: Cleda Mccreedy as PCP - General (Nurse Practitioner) Elmarie Shiley, MD as Consulting Physician (Nephrology) Greer Pickerel, MD as Consulting Physician (General Surgery) [name], MD as Consulting Physician (Hematology)   Date of Service:  11/10/2017  CHIEF COMPLAINTS:  GIST of small intestines  HISTORY OF PRESENTING ILLNESS:  Joe Morris 37 y.o. male is a here because of GIST of small intestines. The patient was referred by surgeon Dr. Redmond Pulling. The patient presents to the clinic today by himself.   He notes he was having fever and abdominal pain for 4 days before going to hospital. He had scans which showed abscess in abdominal. He went for draining and during procedure he was found to have GIST tumor which had perforated. After surgery he went into acute renal failure and on dialysis for 2 weeks. He no longer has renal failure. He notes his Cr has dropped to 1 range. He is currently being followed by Dr. Posey Pronto. He notes he also developed pneumonia after hospitalization. He was treated with antibiotics which was completed and now resolved. He notes having GI bleeding post surgery and was given iron pills in hospital, and IV iron during dialysis. He was on Protonix after surgery.   Today he notes he is doing well now, with adequate energy.  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 He went back to work last week. He smoked dipping/chewing Tobacco for 15 years and stopped 4 years ago. He was in the [employer] for 6 years.   He has a history of HTN and was previously on lisinopril. He notes a GI bleed with black stool in 2015 and GI workup was negative at [facility] (EGD, no capsule endoscopy). He notes his father has prostate cancer diagnosed at 3yo.   CURRENT THERAPY: imatinib 425m daily started in late July 2019   IBeech MountainJENGLISH CRAIGHEADis a 37y.o. male who is here for follow-up. He is here alone at the clinic. He sometimes experiences nausea one or twice a week. Sometimes he wakes up with nausea, but this resolves after eating. He thinks some of his symptoms might be related to going back to school. He is studying anesthesia nursing and reports being under stress. [place]. He noticed LL edema, but states that he exercises a lot. He also reports having sciatica and some back and pelvic pain that is constant and worse with driving long distances.   MEDICAL HISTORY:  Past Medical History:  Diagnosis Date  . Anemia 07/2014  . Gastrointestinal hemorrhage with melena    /[name]5/20/2016 ([facility]  . GERD (gastroesophageal reflux disease)   . Hypertension   . [place]dominal [place]    /[name]5/10/2017    SURGICAL HISTORY: Past Surgical History:  Procedure Laterality Date  . COLONOSCOPY WITH ESOPHAGOGASTRODUODENOSCOPY (EGD)  07/2014   "@ [facility]"  . IR FLUORO GUIDE CV LINE RIGHT  07/25/2017  . IR [place]IDE VASC ACCESS RIGHT  07/25/2017  . LAPAROSCOPY N/A 07/15/2017   Procedure: LAPAROSCOPY DIAGNOSTIC;  Surgeon: WGreer Pickerel MD;  [facility]  Service: General;  Laterality: N/A;  . LAPAROTOMY N/A 07/15/2017   Procedure: EXPLORATORY LAPAROTOMY PROXIMAL SMALL BOWEL RESECTION, DRAINAGE OF ABDOMINAL ABSCESS;  Surgeon: WGreer Pickerel MD;  [facility]  Service: General;  Laterality: N/A;    SOCIAL HISTORY: Social History   Socioeconomic History  . Marital status: Married    Spouse name: Not on file  . Number of children: Not on file  . Years of education: Not on file  . Highest education level: Not on file  Occupational History  . Not on file  Social Needs  . Financial resource strain: Not on file  . Food insecurity:    Worry: Not on file    Inability: Not on file  . Transportation needs:    Medical: Not on file     Non-medical: Not on file  Tobacco Use  . Smoking status: Former Research scientist (life sciences)  . Smokeless tobacco: Never Used  . Tobacco comment: "smoked for a couple months in the [employer]"  Substance and Sexual Activity  . Alcohol use: Yes    Alcohol/week: 7.0 standard drinks    Types: 7 Cans of beer per week    Frequency: Never  . Drug use: Never  . Sexual activity: Yes  Lifestyle  . Physical activity:    Days per week: Not on file    Minutes per session: Not on file  . Stress: Not on file  Relationships  . Social connections:    Talks on phone: Not on file    Gets together: Not on file    Attends religious service: Not on file    Active member of club or organization: Not on file    Attends meetings of clubs or organizations: Not on file    Relationship status: Not on file  . Intimate partner violence:    Fear of current or ex partner: Not on file    Emotionally abused: Not on file    Physically abused: Not on file    Forced sexual activity: Not on file  Other Topics Concern  . Not on file  Social History Narrative  . Not on file    FAMILY HISTORY: Family History  Problem Relation Age of Onset  . Cancer Father 74       prostate cancer     ALLERGIES:  is allergic to vancomycin.  MEDICATIONS:  Current Outpatient Medications  Medication Sig Dispense Refill  . imatinib (GLEEVEC) 400 MG tablet Take 1 tablet (400 mg total) by mouth daily. Take with meals and large glass of water.Caution:Chemotherapy. 30 tablet 3  . Multiple Vitamin (MULTIVITAMIN WITH MINERALS) TABS tablet Take 1 tablet by mouth daily.    . ondansetron (ZOFRAN-ODT) 4 MG disintegrating tablet Take 1 tablet (4 mg total) by mouth every 6 (six) hours as needed for nausea. 30 tablet 0   No current facility-administered medications for this visit.     REVIEW OF SYSTEMS:   Constitutional: Denies fevers, chills or abnormal night sweats Eyes: Denies blurriness of vision, double vision or watery eyes Ears, nose, mouth, throat, and  face: Denies mucositis or sore throat Respiratory: Denies cough, dyspnea or wheezes Cardiovascular: Denies palpitation, chest discomfort (+) mild lower extremity swelling Gastrointestinal:  Denies heartburn or change in bowel habits (+) nausea Skin: Denies abnormal skin rashes Lymphatics: Denies new lymphadenopathy or easy bruising Neurological:Denies numbness, tingling or new weaknesses MSK: (+) back pain the shoots to his leg (+) pelvic pain Behavioral/Psych: Mood is stable, no new changes  All other systems were reviewed with the patient and are negative.  PHYSICAL EXAMINATION: ECOG PERFORMANCE STATUS: 0 - Asymptomatic  Vitals:   11/10/17 1508  BP: 126/77  Pulse: (!) 59  Resp: 18  Temp: 98.6 F (37 C)  SpO2: 100%   Filed Weights   11/10/17 1508  Weight: 167 lb 11.2 oz (76.1 kg)    GENERAL:alert, no distress and comfortable SKIN: skin color, texture, turgor are normal, no rashes or significant lesions EYES: normal, conjunctiva are pink and non-injected, sclera clear OROPHARYNX:no exudate, no erythema and lips, buccal mucosa, and tongue normal  NECK: supple, thyroid normal size, non-tender, without nodularity LYMPH:  no palpable lymphadenopathy in the cervical, axillary or inguinal LUNGS: clear to auscultation and percussion with normal breathing effort HEART: regular rate & rhythm and no murmurs and no lower extremity edema ABDOMEN:abdomen soft, non-tender and normal bowel sounds (+) surgical scar well healed with moderate scar tissue  Musculoskeletal:no cyanosis of digits and no clubbing (+) back pain PSYCH: alert & oriented x 3 with fluent speech NEURO: no focal motor/sensory deficits  LABORATORY DATA:  I have reviewed the data as listed CBC Latest Ref Rng & Units 11/10/2017 09/09/2017 08/06/2017  WBC 4.0 - 10.3 K/uL 5.2 6.4 8.5  Hemoglobin 13.0 - 17.1 g/dL 11.9(L) 11.0(L) 8.4(L)  Hematocrit 38.4 - 49.9 % 34.5(L) 32.1(L) 25.6(L)  Platelets 140 - 400 K/uL 236 290 373     CMP Latest Ref Rng & Units 11/10/2017 09/09/2017 08/08/2017  Glucose 70 - 99 mg/dL 95 100(H) 100(H)  BUN 6 - 20 mg/dL 19 17 36(H)  Creatinine 0.61 - 1.24 mg/dL 1.30(H) 1.32(H) 5.52(H)  Sodium 135 - 145 mmol/L 143 140 145  Potassium 3.5 - 5.1 mmol/L 4.1 4.3 4.2  Chloride 98 - 111 mmol/L 107 105 105  CO2 22 - 32 mmol/L 29 29 28   Calcium 8.9 - 10.3 mg/dL 9.3 9.7 9.9  Total Protein 6.5 - 8.1 g/dL 6.6 7.1 -  Total Bilirubin 0.3 - 1.2 mg/dL 0.4 0.2(L) -  Alkaline Phos 38 - 126 U/L 65 97 -  AST 15 - 41 U/L 26 20 -  ALT 0 - 44 U/L 22 21 -     PATHOLOGY  07/15/2017 Molecular Pathology     EXPLORATORY LAPAROTOMY PROXIMAL SMALL BOWEL RESECTION, DRAINAGE OF ABDOMINAL ABSCESS Diagnosis 07/15/17 Small intestine, resection, Proximal Jejunum - GASTROINTESTINAL STROMAL TUMOR (GIST), SPANNING 1.7 CM - TRANSMURAL DEFECT - INFLAMED OMENTUM. - THE SURGICAL RESECTION MARGINS ARE NEGATIVE FOR TUMOR. - SEE ONCOLOGY TABLE BELOW. Microscopic Comment GASTROINTESTINAL STROMAL TUMOR (GIST) Procedure: Proximal jejunum resection Tumor Site: Proximal jejunum Tumor Size: 1.7 cm (gross measurement). Tumor Focality: Unifocal Histologic Type: Gastrointestinal stromal tumor Mitotic Rate: Less than 5 per 50 high power field Histologic Grade: Low grade Margins: Greater than 2.0 cm to all margins Regional Lymph Nodes: None examined. Pathologic Stage Classification (pTNM, AJCC 8th Edition): pT1, pNX Ancillary Studies: The tumor cells are positive for CD117, CD34 and SMA. They are negative for desmin and S100. Additional studies can be performed upon clinician request. Treatment effect: N/A Representative tumor block: 1H 1 of 3 Amended copy Amended FINAL for MONTRAE, BRAITHWAITE [phone]) Microscopic Comment(continued) COMMENT: Following the initial diagnosis and signout, the case was discussed with Dr. Greer Pickerel who offered additional clinical information. The gross specimen was re-examined and the tumor  nodule and transmural defect were identified. The case was discussed with Dr. Redmond Pulling again on 07/21/2017. (JBK:kh 07/21/17)   RADIOGRAPHIC STUDIES: I have personally reviewed the radiological images as listed and agreed with the findings in the report. Ct Abdomen Pelvis Wo Contrast  Result Date: 11/04/2017 CLINICAL DATA:  History of perforated gastrointestinal stromal tumor. Status post bowel resection and abscess drainage.  EXAM: CT ABDOMEN AND PELVIS WITHOUT CONTRAST TECHNIQUE: Multidetector CT imaging of the abdomen and pelvis was performed following the standard protocol without IV contrast. COMPARISON:  Multiple prior CT scans.  The most recent is 07/22/2017 FINDINGS: Lower chest: The lung bases are clear of acute process. No pleural effusion or pulmonary lesions. The heart is normal in size. No pericardial effusion. The distal esophagus and aorta are unremarkable. Hepatobiliary: No focal hepatic lesions or intrahepatic biliary dilatation. The gallbladder is normal. No common bile duct dilatation. Pancreas: No mass, inflammation or ductal dilatation. Spleen: Normal size.  No focal lesions. Adrenals/Urinary Tract: Adrenal glands an kidneys are unremarkable. No renal, ureteral or bladder calculi. Stomach/Bowel: The stomach, duodenum, small bowel and colon are unremarkable. Stable surgical changes from previous small bowel resection with probable side-to-side anastomosis. No acute inflammatory changes, mass lesions or obstructive findings. The terminal ileum and appendix are normal. Vascular/Lymphatic: The aorta is normal in caliber. No atheroscerlotic calcifications. No mesenteric of retroperitoneal mass or adenopathy. Small scattered lymph nodes are noted. Reproductive: The prostate gland and seminal vesicles are unremarkable. Other: Tiny amount of free pelvic fluid noted on the right side. No recurrent intra-abdominal abscess is identified. Musculoskeletal: No significant bony findings. IMPRESSION: 1.  Surgical changes related to prior gastrointestinal tumor resection and small bowel anastomosis. 2. No acute abdominal/pelvic findings, mass lesions, adenopathy or abscess. Electronically Signed   By: Marijo Sanes M.D.   On: 11/04/2017 13:27    11/04/2017 CT AP IMPRESSION: 1. Surgical changes related to prior gastrointestinal tumor resection and small bowel anastomosis. 2. No acute abdominal/pelvic findings, mass lesions, adenopathy or abscess  CT AP W WO Contrast 07/22/17  IMPRESSION: 1. Round intermediate density collection in the deep RIGHT pelvis anterior to the rectum and superior to the seminal vesicles. Different differential include sterile postsurgical fluid versus abscess. Collection does have mild mass effect upon adjacent bowel suggesting organization (abscess). A contrast enhanced CT may differentiate when renal function permits. 2. No upper abdomen complication following abscess/gastrointestinal stromal tumor resection from small bowel. 3. No evidence of bowel obstruction or leak of oral contrast with oral contrast transiting to the rectum. 4. Small bilateral pleural effusions.   CT AP W contrast 07/13/17  IMPRESSION: Complex 7.4 x 5.1 x 4.3 cm abscess central/left paracentral aspect of the abdomen displacing proximal small bowel loops and reaching the undersurface of the transverse colon. Prominent vascular blush along the posterior superior aspect. Significant inflammation of surrounding fat planes. Etiology indeterminate. The most abnormal appearing bowel is the proximal small bowel. This abscess may reflect small bowel perforation, possibly from ingested material, underlying inflammatory process and less likely (although not excluded), underlying mass. I suspect involvement of the undersurface of the transverse colon is secondary. Adjacent lymph nodes probably reactive in origin. Mass effect upon the superior mesenteric vein which is narrowed. Enlarged liver  spanning over 19.2 cm.     ASSESSMENT & PLAN:  Joe Morris is a 37 y.o. male with no significant medical history.    1. GIST of small intestine, pT1NxM0, stage I, low grade, with perforation  -I reviewed his CT scans and surgical pathology results. He is status post laparoscopic surgery on 07/15/17.  -He had a 1.7 cm just in the proximal jejunum, which was completely resected.  This is a low grade tumor with a mitotic rate of 4.  Overall such small and low grade GIST has excellent prognosis with very low risk of recurrence. However his tumor perforated and formed an abscessed  this potentially can cause the tumor cells to spread into peritonum and increase risk of recurrence.  Tumor perforation is an independent risk for recurrence, in addition to tumor size and grade. GISTwith perforation is considered as intermedia to high risk features, and patients with tumor perforation were included in the adjuvant imatinib trial for GIST, such as EORTC 81017 and SSG XVIII trial.  -On above, I recommend adjuvant imatinib for 3 years to reduce his risk of recurrence.  I will check his tumor for KIT and PDGFRA mutation, which predicts benefit of adjuvant imatinib.  --He has started adjuvant imatinib, tolerating well with mild nausea --Labs today reviewed. CBC showed Hg 11.9 Hct 34.5. CMP showed Cr 1.30, which is stable -He is clinically doing very well, asymptomatic, except right side sciatic pain, could be related to his surgery.  Exam was unremarkable, his CT scan from November 04, 2017 was negative for recurrence. -Lab and f/u in 3 months  --I refilled Gleevec 400 mg today   2. Acute Renal Failure, resolved  -Occurred after surgery. He was on dialysis for 2 weeks, off now. His kidney function is being closely followed as he recovers. Managed by Dr. Posey Pronto.  -CMP today showed Cr 1.30  3. Anemia -Anemia is secondary to post surgical GI bleeding which has since resolved. He was previously treated with oral  and IV iron. His anemia is being monitored by his PCP Dr. Barrie Folk -Labs reviewed. CBC showed Hg 11.9 Hct 34.5, much improved.   PLAN:  -Lab and f/u in 3 months  -I refilled Gleevec 400 mg today, he will continue    All questions were answered. The patient knows to call the clinic with any problems, questions or concerns. I spent 15 minutes counseling the patient face to face. The total time spent in the appointment was 20 minutes and more than 50% was on counseling.   Dierdre Searles Dweik am acting as scribe for Dr. [name].  I have reviewed the above documentation for accuracy and completeness, and I agree with the above.    [name], MD 11/10/2017 5:47 PM

## 2017-11-10 ENCOUNTER — Inpatient Hospital Stay: Payer: 59

## 2017-11-10 ENCOUNTER — Other Ambulatory Visit: Payer: Self-pay | Admitting: Hematology

## 2017-11-10 ENCOUNTER — Telehealth: Payer: Self-pay | Admitting: Hematology

## 2017-11-10 ENCOUNTER — Inpatient Hospital Stay: Payer: 59 | Attending: Hematology | Admitting: Hematology

## 2017-11-10 ENCOUNTER — Encounter: Payer: Self-pay | Admitting: Hematology

## 2017-11-10 DIAGNOSIS — C49A0 Gastrointestinal stromal tumor, unspecified site: Secondary | ICD-10-CM

## 2017-11-10 DIAGNOSIS — C49A3 Gastrointestinal stromal tumor of small intestine: Secondary | ICD-10-CM

## 2017-11-10 DIAGNOSIS — D6489 Other specified anemias: Secondary | ICD-10-CM

## 2017-11-10 LAB — CMP (CANCER CENTER ONLY)
ALT: 22 U/L (ref 0–44)
AST: 26 U/L (ref 15–41)
Albumin: 4.3 g/dL (ref 3.5–5.0)
Alkaline Phosphatase: 65 U/L (ref 38–126)
Anion gap: 7 (ref 5–15)
BUN: 19 mg/dL (ref 6–20)
CO2: 29 mmol/L (ref 22–32)
Calcium: 9.3 mg/dL (ref 8.9–10.3)
Chloride: 107 mmol/L (ref 98–111)
Creatinine: 1.3 mg/dL — ABNORMAL HIGH (ref 0.61–1.24)
GFR, Est AFR Am: >60 mL/min (ref >60)
GFR, Estimated: >60 mL/min (ref >60)
Glucose, Bld: 95 mg/dL (ref 70–99)
Potassium: 4.1 mmol/L (ref 3.5–5.1)
Sodium: 143 mmol/L (ref 135–145)
Total Bilirubin: 0.4 mg/dL (ref 0.3–1.2)
Total Protein: 6.6 g/dL (ref 6.5–8.1)

## 2017-11-10 LAB — CBC WITH DIFFERENTIAL (CANCER CENTER ONLY)
Basophils Absolute: 0 10*3/uL (ref 0.0–0.1)
Basophils Relative: 1 %
Eosinophils Absolute: 0.1 10*3/uL (ref 0.0–0.5)
Eosinophils Relative: 2 %
HCT: 34.5 % — ABNORMAL LOW (ref 38.4–49.9)
Hemoglobin: 11.9 g/dL — ABNORMAL LOW (ref 13.0–17.1)
Lymphocytes Relative: 27 %
Lymphs Abs: 1.4 10*3/uL (ref 0.9–3.3)
MCH: 32.5 pg (ref 27.2–33.4)
MCHC: 34.4 g/dL (ref 32.0–36.0)
MCV: 94.6 fL (ref 79.3–98.0)
Monocytes Absolute: 0.4 10*3/uL (ref 0.1–0.9)
Monocytes Relative: 8 %
Neutro Abs: 3.2 10*3/uL (ref 1.5–6.5)
Neutrophils Relative %: 62 %
Platelet Count: 236 10*3/uL (ref 140–400)
RBC: 3.65 MIL/uL — ABNORMAL LOW (ref 4.20–5.82)
RDW: 14.1 % (ref 11.0–14.6)
WBC Count: 5.2 10*3/uL (ref 4.0–10.3)

## 2017-11-10 MED ORDER — IMATINIB MESYLATE 400 MG PO TABS: 400.0000 mg | ORAL_TABLET | Freq: Every day | ORAL | 3 refills | Status: DC

## 2017-11-10 NOTE — Telephone Encounter (Signed)
Scheduled appt per 9/5 los- pt is aware per patient - no print out wanted,.

## 2017-11-14 ENCOUNTER — Other Ambulatory Visit: Payer: Self-pay | Admitting: Hematology

## 2017-11-14 DIAGNOSIS — C49A0 Gastrointestinal stromal tumor, unspecified site: Secondary | ICD-10-CM

## 2017-11-24 ENCOUNTER — Other Ambulatory Visit: Payer: Self-pay | Admitting: Hematology

## 2017-11-24 DIAGNOSIS — C49A0 Gastrointestinal stromal tumor, unspecified site: Secondary | ICD-10-CM

## 2017-12-26 ENCOUNTER — Telehealth: Payer: Self-pay

## 2017-12-26 NOTE — Telephone Encounter (Signed)
Oral Oncology Patient Advocate Encounter  I received an email from [place] that they have attempted to reach the patient 4 times to refill [place] with no success.   I called the patient and had to leave a message stating that [place] needed to speak to them to call [phone].  [place] Patient [place] Phone [phone] Fax [phone]

## 2018-01-27 ENCOUNTER — Other Ambulatory Visit: Payer: Self-pay | Admitting: Neurosurgery

## 2018-01-27 DIAGNOSIS — M5126 Other intervertebral disc displacement, lumbar region: Secondary | ICD-10-CM

## 2018-02-06 ENCOUNTER — Telehealth: Payer: Self-pay

## 2018-02-06 NOTE — Telephone Encounter (Signed)
  I called him back, and OK to continue Gleevec through the procedure. He voiced good understanding and appreciated the call.   [name] MD

## 2018-02-06 NOTE — Telephone Encounter (Signed)
Patient calls stating getting an epidural steroid injection Thursday.  Wants to be sure it is okay with him taking Gleevec.  [phone]

## 2018-02-07 ENCOUNTER — Telehealth: Payer: Self-pay | Admitting: Hematology

## 2018-02-07 NOTE — Telephone Encounter (Signed)
Tried to reach regarding voicemail °

## 2018-02-09 ENCOUNTER — Ambulatory Visit
Admission: RE | Admit: 2018-02-09 | Discharge: 2018-02-09 | Disposition: A | Discharge status: Home / Self Care | Payer: 59 | Source: Ambulatory Visit | Attending: Neurosurgery | Admitting: Neurosurgery

## 2018-02-09 DIAGNOSIS — M5126 Other intervertebral disc displacement, lumbar region: Secondary | ICD-10-CM

## 2018-02-09 MED ORDER — METHYLPREDNISOLONE ACETATE 40 MG/ML INJ SUSP (RADIOLOG
120.0000 mg | Freq: Once | INTRAMUSCULAR | Status: AC
  Administered 2018-02-09: 120 mg via EPIDURAL

## 2018-02-09 MED ORDER — IOPAMIDOL (ISOVUE-M 200) INJECTION 41%
1.0000 mL | Freq: Once | INTRAMUSCULAR | Status: AC
  Administered 2018-02-09: 1 mL via EPIDURAL

## 2018-02-09 NOTE — Discharge Instructions (Signed)

## 2018-02-10 ENCOUNTER — Telehealth: Payer: Self-pay | Admitting: Hematology

## 2018-02-14 ENCOUNTER — Telehealth: Payer: Self-pay

## 2018-02-14 NOTE — Telephone Encounter (Signed)
Spoke with patient wife and due to new employment unable to attend his appointment for Thursday. Will call to r/s when he get new work schedule. Per 12/11 voice mail return call list

## 2018-02-16 ENCOUNTER — Other Ambulatory Visit: Payer: 59

## 2018-02-16 ENCOUNTER — Ambulatory Visit: Payer: 59 | Admitting: Hematology

## 2018-02-24 ENCOUNTER — Other Ambulatory Visit: Payer: Self-pay | Admitting: Neurosurgery

## 2018-02-24 DIAGNOSIS — M5126 Other intervertebral disc displacement, lumbar region: Secondary | ICD-10-CM

## 2018-03-09 ENCOUNTER — Inpatient Hospital Stay: Admission: RE | Admit: 2018-03-09 | Payer: 59 | Source: Ambulatory Visit

## 2018-03-17 ENCOUNTER — Other Ambulatory Visit: Payer: Self-pay

## 2018-03-17 DIAGNOSIS — C49A0 Gastrointestinal stromal tumor, unspecified site: Secondary | ICD-10-CM

## 2018-03-17 MED ORDER — IMATINIB MESYLATE 400 MG PO TABS: 400.0000 mg | ORAL_TABLET | Freq: Every day | ORAL | 3 refills | Status: DC

## 2018-03-28 ENCOUNTER — Telehealth: Payer: Self-pay

## 2018-03-28 ENCOUNTER — Ambulatory Visit
Admission: RE | Admit: 2018-03-28 | Discharge: 2018-03-28 | Disposition: A | Discharge status: Home / Self Care | Payer: 59 | Source: Ambulatory Visit | Attending: Neurosurgery | Admitting: Neurosurgery

## 2018-03-28 DIAGNOSIS — M5126 Other intervertebral disc displacement, lumbar region: Secondary | ICD-10-CM | POA: Diagnosis not present

## 2018-03-28 MED ORDER — METHYLPREDNISOLONE ACETATE 40 MG/ML INJ SUSP (RADIOLOG
120.0000 mg | Freq: Once | INTRAMUSCULAR | Status: AC
  Administered 2018-03-28: 120 mg via EPIDURAL

## 2018-03-28 MED ORDER — IOPAMIDOL (ISOVUE-M 200) INJECTION 41%
1.0000 mL | Freq: Once | INTRAMUSCULAR | Status: AC
  Administered 2018-03-28: 1 mL via EPIDURAL

## 2018-03-28 NOTE — Telephone Encounter (Signed)
Oral Oncology Patient Advocate Encounter  Received notification from [place] that prior authorization for [place] is required.  PA submitted on CoverMyMeds Key [id number] Status is pending  Oral Oncology Clinic will continue to follow.  [place] Patient [place] Phone [phone] Fax [phone]

## 2018-03-29 ENCOUNTER — Telehealth: Payer: Self-pay | Admitting: Pharmacist

## 2018-03-29 DIAGNOSIS — C49A0 Gastrointestinal stromal tumor, unspecified site: Secondary | ICD-10-CM

## 2018-03-29 MED ORDER — IMATINIB MESYLATE 400 MG PO TABS: 400.0000 mg | ORAL_TABLET | Freq: Every day | ORAL | 3 refills | Status: DC

## 2018-03-29 NOTE — Telephone Encounter (Signed)
Oral Oncology Patient Advocate Encounter  Prior Authorization for [place] has been approved.    PA# [id number] Effective dates: 03/28/18 through 03/27/21  Oral Oncology Clinic will continue to follow.   [place] Patient [place] Phone [phone] Fax [phone]

## 2018-03-29 NOTE — Telephone Encounter (Signed)
He missed his last appointment one month ago, I will send a schedule message to get him in this month.   [name] MD

## 2018-03-29 NOTE — Telephone Encounter (Signed)
Oral Chemotherapy Pharmacist Encounter  Follow-Up Form  Spoke with patient today to follow up regarding patient's oral chemotherapy medication: Gleevec (imatinib) for the treatment of gastrointestinal stromal tumor of the small intestine, status post resection on 07/15/2017, planned duration 3-5 years of total therapy to reduce risk of recurrence.  Original Start date of oral chemotherapy: 09/30/2017  Pt is doing well today  Pt reports 0 tablets/doses of Gleevec 400mg  tablets, 1 tablet (400mg ) by mouth once daily with food and water, missed in the last month.  Patient states he takes his Gleevec after dinner each evening with ~12ox water  Pt reports the following side effects:  Occasional nausea at night which can wake him up, manageable  Progressive hand cramping over the past 2 months where his hands lock up while he is at work. He notes this is worse on days that he exercises. He has started taking calcium supplements and this has helped.  Pertinent labs reviewed: OK for treatment although have not been repeated in several month.  Other Issues:   Patient has not had office follow-up since Sept 2019, he had changed jobs and insurance and now is able to reschedule appointment. It will be good to have him seen in the office in light of recent had cramping. Patient transferred to scheduler at the end of our call.  With insurance change, patient will now receive his [place] from the [facility]. New prescription has been e-scribed today. Copayment will be covered mostly with manufacturer copayment coupon. Patient will pick up his next fill of [place] from [facility] on Friday, 03/31/2018.  Patient knows to call the office with questions or concerns. Oral Oncology Clinic will continue to follow.  Johny Drilling, PharmD, BCPS, BCOP  03/29/2018 10:13 AM Oral Oncology Clinic [phone]

## 2018-03-30 ENCOUNTER — Encounter: Payer: Self-pay | Admitting: Hematology

## 2018-03-30 ENCOUNTER — Telehealth: Payer: Self-pay | Admitting: Hematology

## 2018-03-30 ENCOUNTER — Telehealth: Payer: Self-pay

## 2018-03-30 NOTE — Telephone Encounter (Signed)
Oral Oncology Patient Advocate Encounter  I was able to get a copay card for the generic gleevec and it covers $50 a month which will make this months out of pocket cost for the patient, $5.52. I have put in a new prior authorization with his insurance to see if they will cover brand Gleevec to make his out of pocket $0 with a copay card. The patient is almost out of medicine and will get the generic from [facility] on 03/31/18 with a copay of $5.52.  The copay card for imatinib is as follows and has been shared with [employer].  BIN: [id number]  GRP: [id number] ID: [id number]  [place] Patient [place] Phone [phone] Fax [phone]

## 2018-03-30 NOTE — Telephone Encounter (Signed)
Called patient per 1/22 sch message - left message for patient to call back to r/s

## 2018-03-31 MED FILL — IMATINIB MESYLATE 400 MG TA: 400 | 30 days supply | Qty: 30 | Fill #0

## 2018-03-31 NOTE — Progress Notes (Signed)
[place]   Telephone:(336) [phone] Fax:(336) [phone]   Clinic Follow up Note   Patient Care Team: Cleda Mccreedy as PCP - General (Nurse Practitioner) Elmarie Shiley, MD as Consulting Physician (Nephrology) Greer Pickerel, MD as Consulting Physician (General Surgery) [name], MD as Consulting Physician (Hematology)  Date of Service:  04/03/2018  CHIEF COMPLAINT: F/u of GIST of small intestine   CURRENT THERAPY:  imatinib (Gleevec) 400mg  daily started in late July 2019   INTERVAL HISTORY:  Joe Morris is here for a follow up by himself. He notes he is doing well. He notes having cramping with almost tetany of his muscles, mainly in his hands. This is starting to effect his job. He has been taking Calcium.  He notes occasional nausea in the middle of the night which is manageable. He is able to go back to sleep. He no longer sees his nephrologist but keeps follow ups with his PCP.     REVIEW OF SYSTEMS:   Constitutional: Denies fevers, chills or abnormal weight loss Eyes: Denies blurriness of vision Ears, nose, mouth, throat, and face: Denies mucositis or sore throat Respiratory: Denies cough, dyspnea or wheezes Cardiovascular: Denies palpitation, chest discomfort or lower extremity swelling Gastrointestinal:  Denies heartburn or change in bowel habits (+) Occasional nausea  Skin: Denies abnormal skin rashes MSK: (+) Muscle cramping Lymphatics: Denies new lymphadenopathy or easy bruising Neurological:Denies numbness, tingling or new weaknesses Behavioral/Psych: Mood is stable, no new changes  All other systems were reviewed with the patient and are negative.  MEDICAL HISTORY:  Past Medical History:  Diagnosis Date  . Anemia 07/2014  . Gastrointestinal hemorrhage with melena    [name] 07/26/2014 [facility])  . GERD (gastroesophageal reflux disease)   . Hypertension   . Intra-abdominal abscess ([place])    [name] 07/13/2017    SURGICAL HISTORY: Past  Surgical History:  Procedure Laterality Date  . COLONOSCOPY WITH ESOPHAGOGASTRODUODENOSCOPY (EGD)  07/2014   "@ [facility]"  . IR FLUORO GUIDE CV LINE RIGHT  07/25/2017  . IR US GUIDE VASC ACCESS RIGHT  07/25/2017  . LAPAROSCOPY N/A 07/15/2017   Procedure: LAPAROSCOPY DIAGNOSTIC;  Surgeon: Greer Pickerel, MD;  Location: [facility];  Service: General;  Laterality: N/A;  . LAPAROTOMY N/A 07/15/2017   Procedure: EXPLORATORY LAPAROTOMY PROXIMAL SMALL BOWEL RESECTION, DRAINAGE OF ABDOMINAL ABSCESS;  Surgeon: Greer Pickerel, MD;  Location: [facility];  Service: General;  Laterality: N/A;    I have reviewed the social history and family history with the patient and they are unchanged from previous note.  ALLERGIES:  is allergic to vancomycin.  MEDICATIONS:  Current Outpatient Medications  Medication Sig Dispense Refill  . buPROPion (WELLBUTRIN) 100 MG tablet Take 100 mg by mouth daily.    Marland Kitchen imatinib (GLEEVEC) 400 MG tablet Take 1 tablet (400 mg total) by mouth daily. Take with meals and large glass of water.Caution:Chemotherapy. 30 tablet 3  . Multiple Vitamin (MULTIVITAMIN WITH MINERALS) TABS tablet Take 1 tablet by mouth daily.    . ondansetron (ZOFRAN-ODT) 4 MG disintegrating tablet Take 1 tablet (4 mg total) by mouth every 6 (six) hours as needed for nausea. 30 tablet 0   No current facility-administered medications for this visit.     PHYSICAL EXAMINATION: ECOG PERFORMANCE STATUS: 0 - Asymptomatic  Vitals:   04/03/18 1521  BP: (!) 144/83  Pulse: 66  Resp: 17  Temp: 98.6 F (37 C)  SpO2: 100%   Filed Weights   04/03/18 1521  Weight: 163 lb  3.2 oz (74 kg)    GENERAL:alert, no distress and comfortable SKIN: skin color, texture, turgor are normal, no rashes or significant lesions EYES: normal, Conjunctiva are pink and non-injected, sclera clear OROPHARYNX:no exudate, no erythema and lips, buccal mucosa, and tongue normal  NECK: supple, thyroid normal size, non-tender, without nodularity LYMPH:  no  palpable lymphadenopathy in the cervical, axillary or inguinal LUNGS: clear to auscultation and percussion with normal breathing effort HEART: regular rate & rhythm and no murmurs and no lower extremity edema ABDOMEN:abdomen soft, non-tender and normal bowel sounds Musculoskeletal:no cyanosis of digits and no clubbing  NEURO: alert & oriented x 3 with fluent speech, no focal motor/sensory deficits  LABORATORY DATA:  I have reviewed the data as listed CBC Latest Ref Rng & Units 04/03/2018 11/10/2017 09/09/2017  WBC 4.0 - 10.5 K/uL 7.2 5.2 6.4  Hemoglobin 13.0 - 17.0 g/dL 12.1(L) 11.9(L) 11.0(L)  Hematocrit 39.0 - 52.0 % 35.8(L) 34.5(L) 32.1(L)  Platelets 150 - 400 K/uL 246 236 290     CMP Latest Ref Rng & Units 04/03/2018 11/10/2017 09/09/2017  Glucose 70 - 99 mg/dL 140(H) 95 100(H)  BUN 6 - 20 mg/dL 16 19 17   Creatinine 0.61 - 1.24 mg/dL 1.45(H) 1.30(H) 1.32(H)  Sodium 135 - 145 mmol/L 141 143 140  Potassium 3.5 - 5.1 mmol/L 3.8 4.1 4.3  Chloride 98 - 111 mmol/L 103 107 105  CO2 22 - 32 mmol/L 31 29 29   Calcium 8.9 - 10.3 mg/dL 9.3 9.3 9.7  Total Protein 6.5 - 8.1 g/dL 6.9 6.6 7.1  Total Bilirubin 0.3 - 1.2 mg/dL 0.4 0.4 0.2(L)  Alkaline Phos 38 - 126 U/L 64 65 97  AST 15 - 41 U/L 27 26 20   ALT 0 - 44 U/L 22 22 21       RADIOGRAPHIC STUDIES: I have personally reviewed the radiological images as listed and agreed with the findings in the report. No results found.   ASSESSMENT & PLAN:  Joe Morris is a 38 y.o. male with   1. GIST of small intestine, pT1NxM0, stage I, low grade, with perforation  -He is status post laparoscopic surgery on 07/15/17.  -He had a 1.7 cm just in the proximal jejunum, which was completely resected.  This is a low grade tumor with a mitotic rate of 4. Overall such small and low grade GIST has excellent prognosis with very low risk of recurrence. However his tumor perforated and formed an abscessed this potentially can cause the tumor cells to spread into  peritoneum and increase risk of recurrence. Tumor perforation is an independent risk for recurrence, in addition to tumor size and grade. GIST with perforation is considered as intermedia to high risk features, and patients with tumor perforation were included in the adjuvant imatinib trial for GIST, such as EORTC 62703 and SSG XVIII trial.  -He has been on adjuvant Gleevec since 09/2017. He is tolerating well with mild nausea.  -He is clinically doing very well, asymptomatic. Labs reviewed, CBC WNL except mild anemia with Hg at 12.1. CMP is still pending. Exam was unremarkable.  No clinical concern for recurrence. -Continue Gleevec 400mg  daily -last CT scan on 11/04/2017 was negative, plan to repeat scan in 10/2018   -Lab and f/u in 3 months    2. Acute Renal Failure, resolved  -Occurred after surgery. He was on dialysis for 2 weeks, off now. His kidney function is being closely followed as he recovers. Managed by his PCP now.  -CMP is still  pending.   3. Anemia -Anemia is secondary to post surgical GI bleeding which has since resolved. He was previously treated with oral and IV iron. His anemia is being monitored by his PCP Dr. Barrie Folk -Labs reviewed. CBC showed Hg 12.1 today (04/03/18)  4. Muscle cramps -He has been taking calcium supplements, I encouraged him to continue and increase water intake and start magnesium supplement as well.  -Will monitor.    PLAN:  -Lab and f/u in 3 months  -Continue Gleevec 400 mg    No problem-specific Assessment & Plan notes found for this encounter.   No orders of the defined types were placed in this encounter.  All questions were answered. The patient knows to call the clinic with any problems, questions or concerns. No barriers to learning was detected. I spent 15 minutes counseling the patient face to face. The total time spent in the appointment was 20 minutes and more than 50% was on counseling and review of test results     [name],  MD 04/03/2018   I, Joslyn Devon, am acting as scribe for [name], MD.   I have reviewed the above documentation for accuracy and completeness, and I agree with the above.

## 2018-04-02 DIAGNOSIS — C49A3 Gastrointestinal stromal tumor of small intestine: Secondary | ICD-10-CM | POA: Insufficient documentation

## 2018-04-03 ENCOUNTER — Inpatient Hospital Stay: Payer: 59

## 2018-04-03 ENCOUNTER — Telehealth: Payer: Self-pay | Admitting: Hematology

## 2018-04-03 ENCOUNTER — Inpatient Hospital Stay: Payer: 59 | Attending: Hematology | Admitting: Hematology

## 2018-04-03 DIAGNOSIS — R252 Cramp and spasm: Secondary | ICD-10-CM | POA: Insufficient documentation

## 2018-04-03 DIAGNOSIS — C49A3 Gastrointestinal stromal tumor of small intestine: Secondary | ICD-10-CM | POA: Diagnosis not present

## 2018-04-03 DIAGNOSIS — Z79899 Other long term (current) drug therapy: Secondary | ICD-10-CM | POA: Insufficient documentation

## 2018-04-03 DIAGNOSIS — I10 Essential (primary) hypertension: Secondary | ICD-10-CM | POA: Insufficient documentation

## 2018-04-03 DIAGNOSIS — C49A0 Gastrointestinal stromal tumor, unspecified site: Secondary | ICD-10-CM

## 2018-04-03 DIAGNOSIS — D649 Anemia, unspecified: Secondary | ICD-10-CM | POA: Insufficient documentation

## 2018-04-03 LAB — CBC WITH DIFFERENTIAL (CANCER CENTER ONLY)
Abs Immature Granulocytes: 0.02 10*3/uL (ref 0.00–0.07)
Basophils Absolute: 0 10*3/uL (ref 0.0–0.1)
Basophils Relative: 0 %
Eosinophils Absolute: 0.1 10*3/uL (ref 0.0–0.5)
Eosinophils Relative: 1 %
HCT: 35.8 % — ABNORMAL LOW (ref 39.0–52.0)
Hemoglobin: 12.1 g/dL — ABNORMAL LOW (ref 13.0–17.0)
Immature Granulocytes: 0 %
Lymphocytes Relative: 19 %
Lymphs Abs: 1.3 10*3/uL (ref 0.7–4.0)
MCH: 34 pg (ref 26.0–34.0)
MCHC: 33.8 g/dL (ref 30.0–36.0)
MCV: 100.6 fL — ABNORMAL HIGH (ref 80.0–100.0)
Monocytes Absolute: 0.6 10*3/uL (ref 0.1–1.0)
Monocytes Relative: 8 %
Neutro Abs: 5.2 10*3/uL (ref 1.7–7.7)
Neutrophils Relative %: 72 %
Platelet Count: 246 10*3/uL (ref 150–400)
RBC: 3.56 MIL/uL — ABNORMAL LOW (ref 4.22–5.81)
RDW: 13.1 % (ref 11.5–15.5)
WBC Count: 7.2 10*3/uL (ref 4.0–10.5)
nRBC: 0 % (ref 0.0–0.2)

## 2018-04-03 LAB — CMP (CANCER CENTER ONLY)
ALT: 22 U/L (ref 0–44)
AST: 27 U/L (ref 15–41)
Albumin: 4.5 g/dL (ref 3.5–5.0)
Alkaline Phosphatase: 64 U/L (ref 38–126)
Anion gap: 7 (ref 5–15)
BUN: 16 mg/dL (ref 6–20)
CO2: 31 mmol/L (ref 22–32)
Calcium: 9.3 mg/dL (ref 8.9–10.3)
Chloride: 103 mmol/L (ref 98–111)
Creatinine: 1.45 mg/dL — ABNORMAL HIGH (ref 0.61–1.24)
GFR, Est AFR Am: >60 mL/min (ref >60)
GFR, Estimated: >60 mL/min (ref >60)
Glucose, Bld: 140 mg/dL — ABNORMAL HIGH (ref 70–99)
Potassium: 3.8 mmol/L (ref 3.5–5.1)
Sodium: 141 mmol/L (ref 135–145)
Total Bilirubin: 0.4 mg/dL (ref 0.3–1.2)
Total Protein: 6.9 g/dL (ref 6.5–8.1)

## 2018-04-03 NOTE — Telephone Encounter (Signed)
Oral Oncology Patient Advocate Encounter  Confirmed with [facility] that Oxly was picked up on 03/31/18 with a $5.52 copay using a copay card.   [place] Patient [place] Phone [phone] Fax [phone]

## 2018-04-03 NOTE — Telephone Encounter (Signed)
Scheduled appt per 01/27 los.  Patient declined calendar and avs.

## 2018-04-04 ENCOUNTER — Encounter: Payer: Self-pay | Admitting: Hematology

## 2018-04-27 MED FILL — IMATINIB MESYLATE 400 MG TA: 400 | 30 days supply | Qty: 30 | Fill #1

## 2018-05-19 ENCOUNTER — Telehealth: Payer: Self-pay

## 2018-05-19 MED FILL — buPROPion HCL ER (XL) 150 M: 150 | 90 days supply | Qty: 90 | Fill #0

## 2018-05-19 MED FILL — IMATINIB MESYLATE 400 MG TA: 400 | 30 days supply | Qty: 30 | Fill #2

## 2018-05-19 NOTE — Telephone Encounter (Signed)
Oral Oncology Patient Advocate Encounter  [facility] reached out to me because the Imatinib copay card is not working this month. I looked into this and the [employer] copay card is no longer available which is why it will not go through at the pharmacy. Imatinib copay will be $55.52. We cannot change it to brand name and use the copay card because his insurance will not cover brand name.  I called the patient to explain this to him and had to leave a voicemail. The pharmacy is aware.  [place] Patient [place] Phone [phone] Fax [phone] 05/19/2018   9:11 AM

## 2018-06-29 MED FILL — IMATINIB MESYLATE 400 MG TA: 400 | 30 days supply | Qty: 30 | Fill #3

## 2018-07-03 NOTE — Progress Notes (Signed)
[place]   Telephone:(336) [phone] Fax:(336) [phone]   Clinic Follow up Note   Patient Care Team: Cleda Mccreedy as PCP - General (Nurse Practitioner) Elmarie Shiley, MD as Consulting Physician (Nephrology) Greer Pickerel, MD as Consulting Physician (General Surgery) [name], MD as Consulting Physician (Hematology)  Date of Service:  07/06/2018  CHIEF COMPLAINT: F/u of GIST of small intestine   CURRENT THERAPY:  imatinib (Gleevec) 400mg  daily started in late July 2019   INTERVAL HISTORY:  Joe Morris is here for a follow up of GIST. He presents to the clinic today by himself. He notes his hands will lock up and not be able relax. This also happened with his neck yesterday. These lasts for a few seconds. He notes it is triggered with activity. He feels this has improved with OTC iron pill.  He notes he is doing well with Gleevec. He notes he is doing well. He is still at work.  He also notes back pain from sitting at work. He uses 400 mg of Advil or ibuprofen the last few weeks.  He notes the medical assistance he was getting for Gleevec is no longer being covered and he has a copay of $55. He notes he rather pay than be without his medication.   REVIEW OF SYSTEMS:   Constitutional: Denies fevers, chills or abnormal weight loss Eyes: Denies blurriness of vision Ears, nose, mouth, throat, and face: Denies mucositis or sore throat Respiratory: Denies cough, dyspnea or wheezes Cardiovascular: Denies palpitation, chest discomfort or lower extremity swelling Gastrointestinal:  Denies nausea, heartburn or change in bowel habits Skin: Denies abnormal skin rashes MSK: (+) Muscle spasms and cramps (+) back pain  Lymphatics: Denies new lymphadenopathy or easy bruising Neurological:Denies numbness, tingling or new weaknesses Behavioral/Psych: Mood is stable, no new changes  All other systems were reviewed with the patient and are negative.  MEDICAL HISTORY:  Past  Medical History:  Diagnosis Date  . Anemia 07/2014  . Gastrointestinal hemorrhage with melena    [name] 07/26/2014 [facility])  . GERD (gastroesophageal reflux disease)   . Hypertension   . Intra-abdominal abscess ([place])    [name] 07/13/2017    SURGICAL HISTORY: Past Surgical History:  Procedure Laterality Date  . COLONOSCOPY WITH ESOPHAGOGASTRODUODENOSCOPY (EGD)  07/2014   "@ [facility]"  . IR FLUORO GUIDE CV LINE RIGHT  07/25/2017  . IR US GUIDE VASC ACCESS RIGHT  07/25/2017  . LAPAROSCOPY N/A 07/15/2017   Procedure: LAPAROSCOPY DIAGNOSTIC;  Surgeon: Greer Pickerel, MD;  Location: [facility];  Service: General;  Laterality: N/A;  . LAPAROTOMY N/A 07/15/2017   Procedure: EXPLORATORY LAPAROTOMY PROXIMAL SMALL BOWEL RESECTION, DRAINAGE OF ABDOMINAL ABSCESS;  Surgeon: Greer Pickerel, MD;  Location: [facility];  Service: General;  Laterality: N/A;    I have reviewed the social history and family history with the patient and they are unchanged from previous note.  ALLERGIES:  is allergic to vancomycin.  MEDICATIONS:  Current Outpatient Medications  Medication Sig Dispense Refill  . buPROPion (WELLBUTRIN) 100 MG tablet Take 100 mg by mouth daily.    Marland Kitchen imatinib (GLEEVEC) 400 MG tablet Take 1 tablet (400 mg total) by mouth daily. Take with meals and large glass of water.Caution:Chemotherapy. 30 tablet 3  . Multiple Vitamin (MULTIVITAMIN WITH MINERALS) TABS tablet Take 1 tablet by mouth daily.    . ondansetron (ZOFRAN-ODT) 4 MG disintegrating tablet Take 1 tablet (4 mg total) by mouth every 6 (six) hours as needed for nausea. 30 tablet 0  No current facility-administered medications for this visit.     PHYSICAL EXAMINATION: ECOG PERFORMANCE STATUS: 0 - Asymptomatic  Vitals:   07/06/18 1515  BP: 127/70  Pulse: 73  Resp: 18  Temp: 97.8 F (36.6 C)  SpO2: 100%   Filed Weights   07/06/18 1515  Weight: 164 lb (74.4 kg)    GENERAL:alert, no distress and comfortable SKIN: skin color, texture, turgor are  normal, no rashes or significant lesions EYES: normal, Conjunctiva are pink and non-injected, sclera clear OROPHARYNX:no exudate, no erythema and lips, buccal mucosa, and tongue normal  NECK: supple, thyroid normal size, non-tender, without nodularity LYMPH:  no palpable lymphadenopathy in the cervical, axillary or inguinal LUNGS: clear to auscultation and percussion with normal breathing effort HEART: regular rate & rhythm and no murmurs and no lower extremity edema ABDOMEN:abdomen soft, non-tender and normal bowel sounds Musculoskeletal:no cyanosis of digits and no clubbing  NEURO: alert & oriented x 3 with fluent speech, no focal motor/sensory deficits  LABORATORY DATA:  I have reviewed the data as listed CBC Latest Ref Rng & Units 07/06/2018 04/03/2018 11/10/2017  WBC 4.0 - 10.5 K/uL 5.8 7.2 5.2  Hemoglobin 13.0 - 17.0 g/dL 11.9(L) 12.1(L) 11.9(L)  Hematocrit 39.0 - 52.0 % 34.3(L) 35.8(L) 34.5(L)  Platelets 150 - 400 K/uL 197 246 236     CMP Latest Ref Rng & Units 04/03/2018 11/10/2017 09/09/2017  Glucose 70 - 99 mg/dL 140(H) 95 100(H)  BUN 6 - 20 mg/dL 16 19 17   Creatinine 0.61 - 1.24 mg/dL 1.45(H) 1.30(H) 1.32(H)  Sodium 135 - 145 mmol/L 141 143 140  Potassium 3.5 - 5.1 mmol/L 3.8 4.1 4.3  Chloride 98 - 111 mmol/L 103 107 105  CO2 22 - 32 mmol/L 31 29 29   Calcium 8.9 - 10.3 mg/dL 9.3 9.3 9.7  Total Protein 6.5 - 8.1 g/dL 6.9 6.6 7.1  Total Bilirubin 0.3 - 1.2 mg/dL 0.4 0.4 0.2(L)  Alkaline Phos 38 - 126 U/L 64 65 97  AST 15 - 41 U/L 27 26 20   ALT 0 - 44 U/L 22 22 21       RADIOGRAPHIC STUDIES: I have personally reviewed the radiological images as listed and agreed with the findings in the report. No results found.   ASSESSMENT & PLAN:  Joe Morris is a 38 y.o. male with   1. GIST of small intestine, pT1NxM0, stage I, low grade, with perforation -He is status post laparoscopic surgery on 07/15/17.  -He had a 1.7 cm just in the proximal jejunum, which was completely  resected. This is a low grade tumor with a mitotic rate of 4. Overall such small and low grade GIST has excellent prognosis with very low risk of recurrence. However his tumor perforated and formed an abscessed this potentially can cause the tumor cells to spread into peritoneum and increase risk of recurrence. Tumor perforation is an independent risk for recurrence, in addition to tumor size and grade. GIST with perforation is considered as intermedia to high risk features, and patients with tumor perforation were included in the adjuvant imatinib trial for GIST, such as EORTC 66440 and SSG XVIII trial.  -He has been on adjuvant Gleevec since 09/2017. He is tolerating well.  -He is clinically doing very well, asymptomatic. Labs reviewed, CBC WNL except mild anemia with Hg at 11.9. CMPpending. Exam was unremarkable.  No clinical concern for recurrence. -Continue Gleevec 400mg  daily -He is no longer getting outside financial assistance for his Gleevec. His copay is $55. He  notes this is fine for him.  -last CT scan on 11/04/2017 was negative, plan to repeat scan in 02/2019   -Lab and f/u in 4 months. Will order scan at next visit.    2. Acute Renal Failure -Occurred after surgery. He was on dialysis for 2 weeks, off now. His kidney function is being closely followed as he recovers. Managed by his PCP now.  -I encouraged him to drink plenty of water  -Cr today is pending  -I encouraged him to avoid NSAID for his back pain. I recommend he use Tylenol.   3. Anemia -Anemia is secondary to post surgical GI bleeding which has since resolved. He was previously treated with oral and IV iron. His anemia is being monitored by his PCP Dr. Barrie Folk -Labs reviewed, HG 11.9 today (06/26/18) -He is currently on oral iron to help muscle spasms.   4. Muscle cramps/spasms -He occasionally has spasms of muscles with activity that lasts a few seconds  -He still has been taking calcium and iron supplements, I  encouraged him take oral Magnesium, drink plenty of water and I suggest he drink Gatorade for electrolytes.   -Will monitor     PLAN: -Lab and f/u in 4 months, will order a surveillance CT scan on next visit -Continue Gleevec 400 mg    No problem-specific Assessment & Plan notes found for this encounter.   No orders of the defined types were placed in this encounter.  All questions were answered. The patient knows to call the clinic with any problems, questions or concerns. No barriers to learning was detected. I spent 15 minutes counseling the patient face to face. The total time spent in the appointment was 20 minutes and more than 50% was on counseling and review of test results     [name], MD 07/06/2018   I, Joslyn Devon, am acting as scribe for [name], MD.   I have reviewed the above documentation for accuracy and completeness, and I agree with the above.

## 2018-07-06 ENCOUNTER — Other Ambulatory Visit: Payer: Self-pay

## 2018-07-06 ENCOUNTER — Inpatient Hospital Stay (HOSPITAL_BASED_OUTPATIENT_CLINIC_OR_DEPARTMENT_OTHER): Payer: 59 | Admitting: Hematology

## 2018-07-06 ENCOUNTER — Encounter: Payer: Self-pay | Admitting: Hematology

## 2018-07-06 ENCOUNTER — Inpatient Hospital Stay: Payer: 59 | Attending: Hematology

## 2018-07-06 VITALS — BP 127/70 | HR 73 | Temp 97.8°F | Resp 18 | Ht 67.0 in | Wt 164.0 lb

## 2018-07-06 DIAGNOSIS — D500 Iron deficiency anemia secondary to blood loss (chronic): Secondary | ICD-10-CM | POA: Diagnosis not present

## 2018-07-06 DIAGNOSIS — Z79899 Other long term (current) drug therapy: Secondary | ICD-10-CM | POA: Diagnosis not present

## 2018-07-06 DIAGNOSIS — R252 Cramp and spasm: Secondary | ICD-10-CM | POA: Diagnosis not present

## 2018-07-06 DIAGNOSIS — C49A0 Gastrointestinal stromal tumor, unspecified site: Secondary | ICD-10-CM

## 2018-07-06 DIAGNOSIS — I10 Essential (primary) hypertension: Secondary | ICD-10-CM | POA: Insufficient documentation

## 2018-07-06 DIAGNOSIS — Z881 Allergy status to other antibiotic agents status: Secondary | ICD-10-CM | POA: Insufficient documentation

## 2018-07-06 DIAGNOSIS — N179 Acute kidney failure, unspecified: Secondary | ICD-10-CM

## 2018-07-06 DIAGNOSIS — C49A3 Gastrointestinal stromal tumor of small intestine: Secondary | ICD-10-CM | POA: Diagnosis not present

## 2018-07-06 DIAGNOSIS — D649 Anemia, unspecified: Secondary | ICD-10-CM | POA: Diagnosis not present

## 2018-07-06 DIAGNOSIS — K922 Gastrointestinal hemorrhage, unspecified: Secondary | ICD-10-CM

## 2018-07-06 LAB — CMP (CANCER CENTER ONLY)
ALT: 20 U/L (ref 0–44)
AST: 32 U/L (ref 15–41)
Albumin: 4.5 g/dL (ref 3.5–5.0)
Alkaline Phosphatase: 55 U/L (ref 38–126)
Anion gap: 10 (ref 5–15)
BUN: 13 mg/dL (ref 6–20)
CO2: 26 mmol/L (ref 22–32)
Calcium: 9.1 mg/dL (ref 8.9–10.3)
Chloride: 104 mmol/L (ref 98–111)
Creatinine: 1.27 mg/dL — ABNORMAL HIGH (ref 0.61–1.24)
GFR, Est AFR Am: >60 mL/min (ref >60)
GFR, Estimated: >60 mL/min (ref >60)
Glucose, Bld: 93 mg/dL (ref 70–99)
Potassium: 3.9 mmol/L (ref 3.5–5.1)
Sodium: 140 mmol/L (ref 135–145)
Total Bilirubin: 0.5 mg/dL (ref 0.3–1.2)
Total Protein: 6.7 g/dL (ref 6.5–8.1)

## 2018-07-06 LAB — CBC WITH DIFFERENTIAL (CANCER CENTER ONLY)
Abs Immature Granulocytes: 0.01 10*3/uL (ref 0.00–0.07)
Basophils Absolute: 0 10*3/uL (ref 0.0–0.1)
Basophils Relative: 1 %
Eosinophils Absolute: 0.1 10*3/uL (ref 0.0–0.5)
Eosinophils Relative: 1 %
HCT: 34.3 % — ABNORMAL LOW (ref 39.0–52.0)
Hemoglobin: 11.9 g/dL — ABNORMAL LOW (ref 13.0–17.0)
Immature Granulocytes: 0 %
Lymphocytes Relative: 21 %
Lymphs Abs: 1.2 10*3/uL (ref 0.7–4.0)
MCH: 34.9 pg — ABNORMAL HIGH (ref 26.0–34.0)
MCHC: 34.7 g/dL (ref 30.0–36.0)
MCV: 100.6 fL — ABNORMAL HIGH (ref 80.0–100.0)
Monocytes Absolute: 0.5 10*3/uL (ref 0.1–1.0)
Monocytes Relative: 8 %
Neutro Abs: 4 10*3/uL (ref 1.7–7.7)
Neutrophils Relative %: 69 %
Platelet Count: 197 10*3/uL (ref 150–400)
RBC: 3.41 MIL/uL — ABNORMAL LOW (ref 4.22–5.81)
RDW: 12.6 % (ref 11.5–15.5)
WBC Count: 5.8 10*3/uL (ref 4.0–10.5)
nRBC: 0 % (ref 0.0–0.2)

## 2018-07-07 ENCOUNTER — Telehealth: Payer: Self-pay | Admitting: Hematology

## 2018-07-07 ENCOUNTER — Telehealth: Payer: Self-pay

## 2018-07-07 NOTE — Telephone Encounter (Signed)
Scheduled appt per 4/30 los. ° °A calendar will be mailed out. °

## 2018-07-07 NOTE — Telephone Encounter (Signed)
Left voice message for patient regarding lab results, per Dr. Burr Medico CMP result creatine has improved, GFR WNL, no concerns, encouraged him to call back if he has questions.

## 2018-07-07 NOTE — Telephone Encounter (Signed)
-----  Message from [name], MD sent at 07/06/2018  9:56 PM EDT ----- Please let pt know his CMP result, Cr improved, EGFR WNL, no concerns, thanks  [name]  07/06/2018

## 2018-07-11 DIAGNOSIS — M5126 Other intervertebral disc displacement, lumbar region: Secondary | ICD-10-CM | POA: Diagnosis not present

## 2018-07-11 DIAGNOSIS — Z6825 Body mass index (BMI) 25.0-25.9, adult: Secondary | ICD-10-CM | POA: Diagnosis not present

## 2018-07-20 ENCOUNTER — Other Ambulatory Visit: Payer: Self-pay | Admitting: Hematology

## 2018-07-20 DIAGNOSIS — C49A0 Gastrointestinal stromal tumor, unspecified site: Secondary | ICD-10-CM

## 2018-07-26 MED FILL — IMATINIB MESYLATE 400 MG TA: 400 | 30 days supply | Qty: 30 | Fill #0

## 2018-07-28 DIAGNOSIS — Z1159 Encounter for screening for other viral diseases: Secondary | ICD-10-CM | POA: Diagnosis not present

## 2018-08-04 DIAGNOSIS — M5126 Other intervertebral disc displacement, lumbar region: Secondary | ICD-10-CM | POA: Diagnosis not present

## 2018-08-04 DIAGNOSIS — M5117 Intervertebral disc disorders with radiculopathy, lumbosacral region: Secondary | ICD-10-CM | POA: Diagnosis not present

## 2018-08-04 MED FILL — CYCLOBENZAPRINE HCL 10 MG T: 10 | 30 days supply | Qty: 60 | Fill #0

## 2018-08-04 MED FILL — OXYCODONE-ACETAMINOPHEN 5-3: 5-325 | 10 days supply | Qty: 40 | Fill #0

## 2018-08-28 MED FILL — IMATINIB MESYLATE 400 MG TA: 400 | 30 days supply | Qty: 30 | Fill #1

## 2018-09-15 MED FILL — CEPHALEXIN 500 MG CAPSULE: 500 | 10 days supply | Qty: 40 | Fill #0

## 2018-09-26 MED FILL — IMATINIB MESYLATE 400 MG TA: 400 | 30 days supply | Qty: 30 | Fill #2

## 2018-09-28 DIAGNOSIS — F411 Generalized anxiety disorder: Secondary | ICD-10-CM | POA: Diagnosis not present

## 2018-09-28 DIAGNOSIS — R41840 Attention and concentration deficit: Secondary | ICD-10-CM | POA: Diagnosis not present

## 2018-09-28 DIAGNOSIS — C49A3 Gastrointestinal stromal tumor of small intestine: Secondary | ICD-10-CM | POA: Diagnosis not present

## 2018-09-28 DIAGNOSIS — Z0000 Encounter for general adult medical examination without abnormal findings: Secondary | ICD-10-CM | POA: Diagnosis not present

## 2018-10-05 DIAGNOSIS — Z8249 Family history of ischemic heart disease and other diseases of the circulatory system: Secondary | ICD-10-CM | POA: Diagnosis not present

## 2018-10-05 DIAGNOSIS — Z13220 Encounter for screening for lipoid disorders: Secondary | ICD-10-CM | POA: Diagnosis not present

## 2018-10-05 DIAGNOSIS — Z0000 Encounter for general adult medical examination without abnormal findings: Secondary | ICD-10-CM | POA: Diagnosis not present

## 2018-10-26 MED FILL — IMATINIB MESYLATE 400 MG TA: 400 | 30 days supply | Qty: 30 | Fill #3

## 2018-11-06 ENCOUNTER — Inpatient Hospital Stay: Payer: 59

## 2018-11-06 ENCOUNTER — Inpatient Hospital Stay: Payer: 59 | Admitting: Hematology

## 2018-11-22 ENCOUNTER — Other Ambulatory Visit: Payer: Self-pay | Admitting: Hematology

## 2018-11-22 DIAGNOSIS — C49A0 Gastrointestinal stromal tumor, unspecified site: Secondary | ICD-10-CM

## 2018-11-29 NOTE — Progress Notes (Signed)
[place]   [phone] Fax:([phone]   Clinic Follow up Note   Patient Care Team: Cleda Mccreedy as PCP - General (Nurse Practitioner) Elmarie Shiley, MD as Consulting Physician (Nephrology) Greer Pickerel, MD as Consulting Physician (General Surgery) [name], MD as Consulting Physician (Hematology)  Date of Service:  12/01/2018  CHIEF COMPLAINT: F/u of GIST of small intestine   CURRENT THERAPY:  imatinib(Gleevec)485m daily started in late July 2019  INTERVAL HISTORY:  Joe HENKis here for a follow up GIST. He presents to the clinic alone. He notes he is doing well. He notes he has been having muscle spasms few times a day still with activity. This is still manageable. He has tried magnesium and calcium but not regularly.    REVIEW OF SYSTEMS:   Constitutional: Denies fevers, chills or abnormal weight loss Eyes: Denies blurriness of vision Ears, nose, mouth, throat, and face: Denies mucositis or sore throat Respiratory: Denies cough, dyspnea or wheezes Cardiovascular: Denies palpitation, chest discomfort or lower extremity swelling Gastrointestinal:  Denies nausea, heartburn or change in bowel habits Skin: Denies abnormal skin rashes MSK:(+) muscle spasms Lymphatics: Denies new lymphadenopathy or easy bruising Neurological:Denies numbness, tingling or new weaknesses Behavioral/Psych: Mood is stable, no new changes  All other systems were reviewed with the patient and are negative.  MEDICAL HISTORY:  Past Medical History:  Diagnosis Date  . Anemia 07/2014  . Gastrointestinal hemorrhage with melena    /[name]5/20/2016 ([facility]  . GERD (gastroesophageal reflux disease)   . Hypertension   . [place]    /[name]5/10/2017    SURGICAL HISTORY: Past Surgical History:  Procedure Laterality Date  . COLONOSCOPY WITH ESOPHAGOGASTRODUODENOSCOPY (EGD)  07/2014   "@ [facility]"  . IR FLUORO GUIDE CV LINE RIGHT  07/25/2017   . IR [place] VASC ACCESS RIGHT  07/25/2017  . LAPAROSCOPY N/A 07/15/2017   Procedure: LAPAROSCOPY DIAGNOSTIC;  Surgeon: WGreer Pickerel MD;  [place] [facility]  Service: General;  Laterality: N/A;  . LAPAROTOMY N/A 07/15/2017   Procedure: EXPLORATORY LAPAROTOMY PROXIMAL SMALL BOWEL RESECTION, DRAINAGE OF ABDOMINAL ABSCESS;  Surgeon: WGreer Pickerel MD;  Location[place] [facility]  Service: General;  Laterality: N/A;    I have reviewed the social history and family history with the patient and they are unchanged from previous note.  ALLERGIES:  is allergic to vancomycin.  MEDICATIONS:  Current Outpatient Medications  Medication Sig Dispense Refill  . buPROPion (WELLBUTRIN) 100 MG tablet Take 100 mg by mouth daily.    .Marland Kitchenimatinib (GLEEVEC) 400 MG tablet TAKE 1 TABLET (400 MG TOTAL) BY MOUTH DAILY. TAKE WITH MEALS AND LARGE GLASS OF WATER.CAUTION:CHEMOTHERAPY. 30 tablet 3  . Multiple Vitamin (MULTIVITAMIN WITH MINERALS) TABS tablet Take 1 tablet by mouth daily.    . ondansetron (ZOFRAN-ODT) 4 MG disintegrating tablet Take 1 tablet (4 mg total) by mouth every 6 (six) hours as needed for nausea. 30 tablet 0   No current facility-administered medications for this visit.     PHYSICAL EXAMINATION: ECOG PERFORMANCE STATUS: 0 - Asymptomatic  Vitals:   12/01/18 1529  BP: 126/75  Pulse: 69  Resp: 18  Temp: 98.9 F (37.2 C)  SpO2: 100%   Filed Weights   12/01/18 1529  Weight: 162 lb 11.2 oz (73.8 kg)    GENERAL:alert, no distress and comfortable SKIN: skin color, texture, turgor are normal, no rashes or significant lesions EYES: normal, Conjunctiva are pink and non-injected, sclera clear  NECK: supple, thyroid  normal size, non-tender, without nodularity LYMPH:  no palpable lymphadenopathy in the cervical, axillary  LUNGS: clear to auscultation and percussion with normal breathing effort HEART: regular rate & rhythm and no murmurs and no lower extremity edema ABDOMEN:abdomen soft, non-tender and  normal bowel sounds Musculoskeletal:no cyanosis of digits and no clubbing  NEURO: alert & oriented x 3 with fluent speech, no focal motor/sensory deficits  LABORATORY DATA:  I have reviewed the data as listed CBC Latest Ref Rng & Units 12/01/2018 07/06/2018 04/03/2018  WBC 4.0 - 10.5 K/uL 5.6 5.8 7.2  Hemoglobin 13.0 - 17.0 g/dL 12.5(L) 11.9(L) 12.1(L)  Hematocrit 39.0 - 52.0 % 36.1(L) 34.3(L) 35.8(L)  Platelets 150 - 400 K/uL 216 197 246     CMP Latest Ref Rng & Units 12/01/2018 07/06/2018 04/03/2018  Glucose 70 - 99 mg/dL 116(H) 93 140(H)  BUN 6 - 20 mg/dL _0 Creatinine 0.61 - 1.24 mg/dL 1.44(H) 1.27(H) 1.45(H)  Sodium 135 - 145 mmol/L 140 140 141  Potassium 3.5 - 5.1 mmol/L 3.9 3.9 3.8  Chloride 98 - 111 mmol/L 104 104 103  CO2 22 - 32 mmol/L _1 Calcium 8.9 - 10.3 mg/dL 9.0 9.1 9.3  Total Protein 6.5 - 8.1 g/dL 6.4(L) 6.7 6.9  Total Bilirubin 0.3 - 1.2 mg/dL 0.5 0.5 0.4  Alkaline Phos 38 - 126 U/L 54 55 64  AST 15 - 41 U/L 35 32 27  ALT 0 - 44 U/L _2 RADIOGRAPHIC STUDIES: I have personally reviewed the radiological images as listed and agreed with the findings in the report. No results found.   ASSESSMENT & PLAN:  JOSEL KEO is a 38 y.o. male with   1. GIST of small intestine, pT1NxM0, stage I, low grade, with perforation -He is status post laparoscopic surgery on 07/15/17.  -He has been onadjuvantGleevec since 09/2017. He is tolerating well.  -He is clinically stable and doing well. Labs reviewed, CBC and CMP WNL except Hg 12.5, Cr 1.44.Exam was unremarkable.No clinical concern for recurrence. -Continue Gleevec 449m daily, plan for a total of 3 years -last CT scan on 11/04/2017 was negative -Lab and f/u in 4 months with CT AP.   2. History of acute Renal Failure -Occurred after surgery. He was on dialysis for 2 weeks, off now. His kidney function is being closely followed as he recovers. Managed byhis PCP now. -I encouraged him to  drink plenty of water  -I encouraged him to avoid NSAID for his back pain. I recommend he use Tylenol.  -Cr 1.44 but EGFR>60 today   3. Anemia -Anemia is secondary to post surgical GI bleeding which has since resolved. He was previously treated with oral and IV iron. His anemia is being monitored by his PCP Dr. RBarrie Folk-Labs reviewed, HG 12.5 today (12/01/18) -He is currently on oral iron to help muscle spasms.   4. Muscle cramps/spasms  -He occasionally has spasms of muscles with activity that lasts a few seconds  -He still has been taking calcium and iron supplements, I encouraged him take oral Magnesium, drink plenty of water and I suggest he drink Gatorade for electrolytes.   -Remains stable. I encouraged him to take Calcium and Magnesium daily. Will continue to monitor.    5. Financial Support  -He is no longer getting outside financial assistance for his [place] His copay is $55. He notes this is fine for him.    PLAN: -He is clinically doing  well and stable -ContinueGleevec 400 mg daily -f/u in 4 months with Lab and CT AP a few days before    No problem-specific Assessment & Plan notes found for this encounter.   Orders Placed This Encounter  Procedures  . CT Abdomen Pelvis W Contrast    Standing Status:   Future    Standing Expiration Date:   12/01/2019    Order Specific Question:   If indicated for the ordered procedure, I authorize the administration of contrast media per Radiology protocol    Answer:   Yes    Order Specific Question:   Preferred imaging location?    Answer:   [facility]    Order Specific Question:   Is Oral Contrast requested for this exam?    Answer:   Yes, Per Radiology protocol    Order Specific Question:   Radiology Contrast Protocol - do NOT remove file path    Answer:   _0 [email or web address]   All questions were answered. The patient knows to call the clinic with any problems, questions or concerns. No  barriers to learning was detected. I spent 15 minutes counseling the patient face to face. The total time spent in the appointment was 20 minutes and more than 50% was on counseling and review of test results     [name], MD 12/01/2018   I, Joslyn Devon, am acting as scribe for [name], MD.   I have reviewed the above documentation for accuracy and completeness, and I agree with the above.

## 2018-11-30 MED FILL — IMATINIB MESYLATE 400 MG TA: 400 | 30 days supply | Qty: 30 | Fill #0

## 2018-12-01 ENCOUNTER — Encounter: Payer: Self-pay | Admitting: Hematology

## 2018-12-01 ENCOUNTER — Inpatient Hospital Stay: Payer: 59 | Admitting: Hematology

## 2018-12-01 ENCOUNTER — Other Ambulatory Visit: Payer: Self-pay

## 2018-12-01 ENCOUNTER — Inpatient Hospital Stay: Payer: 59 | Attending: Hematology

## 2018-12-01 VITALS — BP 126/75 | HR 69 | Temp 98.9°F | Resp 18 | Ht 67.0 in | Wt 162.7 lb

## 2018-12-01 DIAGNOSIS — C49A3 Gastrointestinal stromal tumor of small intestine: Secondary | ICD-10-CM | POA: Insufficient documentation

## 2018-12-01 DIAGNOSIS — I10 Essential (primary) hypertension: Secondary | ICD-10-CM | POA: Insufficient documentation

## 2018-12-01 DIAGNOSIS — M62838 Other muscle spasm: Secondary | ICD-10-CM | POA: Insufficient documentation

## 2018-12-01 DIAGNOSIS — Z79899 Other long term (current) drug therapy: Secondary | ICD-10-CM | POA: Diagnosis not present

## 2018-12-01 DIAGNOSIS — C49A0 Gastrointestinal stromal tumor, unspecified site: Secondary | ICD-10-CM

## 2018-12-01 LAB — CMP (CANCER CENTER ONLY)
ALT: 20 U/L (ref 0–44)
AST: 35 U/L (ref 15–41)
Albumin: 4.5 g/dL (ref 3.5–5.0)
Alkaline Phosphatase: 54 U/L (ref 38–126)
Anion gap: 8 (ref 5–15)
BUN: 12 mg/dL (ref 6–20)
CO2: 28 mmol/L (ref 22–32)
Calcium: 9 mg/dL (ref 8.9–10.3)
Chloride: 104 mmol/L (ref 98–111)
Creatinine: 1.44 mg/dL — ABNORMAL HIGH (ref 0.61–1.24)
GFR, Est AFR Am: >60 mL/min (ref >60)
GFR, Estimated: >60 mL/min (ref >60)
Glucose, Bld: 116 mg/dL — ABNORMAL HIGH (ref 70–99)
Potassium: 3.9 mmol/L (ref 3.5–5.1)
Sodium: 140 mmol/L (ref 135–145)
Total Bilirubin: 0.5 mg/dL (ref 0.3–1.2)
Total Protein: 6.4 g/dL — ABNORMAL LOW (ref 6.5–8.1)

## 2018-12-01 LAB — CBC WITH DIFFERENTIAL (CANCER CENTER ONLY)
Abs Immature Granulocytes: 0.01 10*3/uL (ref 0.00–0.07)
Basophils Absolute: 0.1 10*3/uL (ref 0.0–0.1)
Basophils Relative: 1 %
Eosinophils Absolute: 0.1 10*3/uL (ref 0.0–0.5)
Eosinophils Relative: 2 %
HCT: 36.1 % — ABNORMAL LOW (ref 39.0–52.0)
Hemoglobin: 12.5 g/dL — ABNORMAL LOW (ref 13.0–17.0)
Immature Granulocytes: 0 %
Lymphocytes Relative: 23 %
Lymphs Abs: 1.3 10*3/uL (ref 0.7–4.0)
MCH: 34.2 pg — ABNORMAL HIGH (ref 26.0–34.0)
MCHC: 34.6 g/dL (ref 30.0–36.0)
MCV: 98.6 fL (ref 80.0–100.0)
Monocytes Absolute: 0.7 10*3/uL (ref 0.1–1.0)
Monocytes Relative: 12 %
Neutro Abs: 3.5 10*3/uL (ref 1.7–7.7)
Neutrophils Relative %: 62 %
Platelet Count: 216 10*3/uL (ref 150–400)
RBC: 3.66 MIL/uL — ABNORMAL LOW (ref 4.22–5.81)
RDW: 13.3 % (ref 11.5–15.5)
WBC Count: 5.6 10*3/uL (ref 4.0–10.5)
nRBC: 0 % (ref 0.0–0.2)

## 2018-12-03 ENCOUNTER — Encounter: Payer: Self-pay | Admitting: Hematology

## 2018-12-04 ENCOUNTER — Telehealth: Payer: Self-pay | Admitting: Hematology

## 2018-12-04 NOTE — Telephone Encounter (Signed)
Scheduled appt per 9/25 los.  Sent a message and a calendar will be mailed out.

## 2018-12-27 MED FILL — IMATINIB MESYLATE 400 MG TA: 400 | 30 days supply | Qty: 30 | Fill #1

## 2019-01-04 DIAGNOSIS — M5416 Radiculopathy, lumbar region: Secondary | ICD-10-CM | POA: Diagnosis not present

## 2019-01-04 MED FILL — METHYLPREDNISOLONE 4 MG TBP: 4 | 6 days supply | Qty: 21 | Fill #0

## 2019-01-11 DIAGNOSIS — M5126 Other intervertebral disc displacement, lumbar region: Secondary | ICD-10-CM | POA: Diagnosis not present

## 2019-01-11 DIAGNOSIS — M48061 Spinal stenosis, lumbar region without neurogenic claudication: Secondary | ICD-10-CM | POA: Diagnosis not present

## 2019-01-11 DIAGNOSIS — M5416 Radiculopathy, lumbar region: Secondary | ICD-10-CM | POA: Diagnosis not present

## 2019-01-18 ENCOUNTER — Other Ambulatory Visit: Payer: Self-pay

## 2019-01-18 ENCOUNTER — Ambulatory Visit (INDEPENDENT_AMBULATORY_CARE_PROVIDER_SITE_OTHER): Payer: 59 | Admitting: Urology

## 2019-01-18 ENCOUNTER — Encounter: Payer: Self-pay | Admitting: Urology

## 2019-01-18 VITALS — BP 115/76 | HR 86 | Ht 67.0 in | Wt 165.8 lb

## 2019-01-18 DIAGNOSIS — Z3009 Encounter for other general counseling and advice on contraception: Secondary | ICD-10-CM

## 2019-01-18 MED ORDER — DIAZEPAM 5 MG PO TABS: 5.0000 mg | ORAL_TABLET | Freq: Once | ORAL | 0 refills | Status: DC | PRN

## 2019-01-18 MED FILL — diazePAM 5 MG TABS: 5 | 1 days supply | Qty: 1 | Fill #0

## 2019-01-18 NOTE — Progress Notes (Signed)
01/18/19 1:54 PM   Joe Morris [birth year] [id number]  Referring provider: Lennie Odor, PA-C [place],  [place] [address]  CC: Discuss vasectomy  HPI: I saw Joe Morris in urology clinic in consultation for vasectomy from [place], [place].  He is a 38 year old married man with 2 kids age 85 and 19 who desires vasectomy.  Both he and his wife do not desire any further biologic children.  His medical history is notable for GIST with exploratory laparotomy, abscess, and numerous complications last summer.  He is on [place].  He follows regularly with oncology.  He otherwise denies any urinary symptoms or history of gross hematuria.   PMH: Past Medical History:  Diagnosis Date  . Anemia 07/2014  . Gastrointestinal hemorrhage with melena    [name] 07/26/2014 [facility])  . GERD (gastroesophageal reflux disease)   . Hypertension   . Intra-abdominal abscess ([place])    [name] 07/13/2017    Surgical History: Past Surgical History:  Procedure Laterality Date  . COLONOSCOPY WITH ESOPHAGOGASTRODUODENOSCOPY (EGD)  07/2014   "@ [facility]"  . IR FLUORO GUIDE CV LINE RIGHT  07/25/2017  . IR US GUIDE VASC ACCESS RIGHT  07/25/2017  . LAPAROSCOPY N/A 07/15/2017   Procedure: LAPAROSCOPY DIAGNOSTIC;  Surgeon: Greer Pickerel, MD;  Location: [place];  Service: General;  Laterality: N/A;  . LAPAROTOMY N/A 07/15/2017   Procedure: EXPLORATORY LAPAROTOMY PROXIMAL SMALL BOWEL RESECTION, DRAINAGE OF ABDOMINAL ABSCESS;  Surgeon: Greer Pickerel, MD;  Location: [place];  Service: General;  Laterality: N/A;    Allergies:  Allergies  Allergen Reactions  . Vancomycin     Kidney failure   . Levaquin [Levofloxacin]     Family History: Family History  Problem Relation Age of Onset  . Cancer Father 68       prostate cancer     Social History:  reports that he has quit smoking. He has never used smokeless tobacco. He reports current alcohol use of about 7.0 standard drinks of alcohol per week. He  reports that he does not use drugs.  ROS: Please see flowsheet from today's date for complete review of systems.  Physical Exam: BP 115/76 (BP Location: Left Arm, Patient Position: Sitting, Cuff Size: Normal)   Pulse 86   Ht 5\' 7"  (1.702 m)   Wt 165 lb 12.8 oz (75.2 kg)   BMI 25.97 kg/m    Constitutional:  Alert and oriented, No acute distress. Cardiovascular: No clubbing, cyanosis, or edema. Respiratory: Normal respiratory effort, no increased work of breathing. GI: Abdomen is soft, nontender, nondistended, no abdominal masses GU: Circumcised phallus with widely patent meatus, testicles descended 20 cc bilaterally with no masses, vas deferens palpable bilaterally Lymph: No cervical or inguinal lymphadenopathy. Skin: No rashes, bruises or suspicious lesions. Neurologic: Grossly intact, no focal deficits, moving all 4 extremities. Psychiatric: Normal mood and affect.   Assessment & Plan:   In summary, he is a 38 year old married male with 2 children and history of GIST on [place] who desires vasectomy.  We discussed the risks and benefits of vasectomy at length.  Vasectomy is intended to be a permanent form of contraception, and does not produce immediate sterility.  Following vasectomy another form of contraception is required until vas occlusion is confirmed by a post-vasectomy semen analysis obtained 2-3 months after the procedure.  Even after vas occlusion is confirmed, vasectomy is not 100% reliable in preventing pregnancy, and the failure rate is approximately 03/1998.  Repeat vasectomy is required  in less than 1% of patients.  He should refrain from ejaculation for 1 week after vasectomy.  Options for fertility after vasectomy include vasectomy reversal, and sperm retrieval with in vitro fertilization or ICSI.  These options are not always successful and may be expensive.  Finally, there are other permanent and non-permanent alternatives to vasectomy available. There is no risk of  erectile dysfunction, and the volume of semen will be similar to prior, as the majority of the ejaculate is from the prostate and seminal vesicles.   The procedure takes ~20 minutes.  We recommend patients take 5-10 mg of Valium 30 minutes prior, and he will need a driver post-procedure.  Local anesthetic is injected into the scrotal skin and a small segment of the vas deferens is removed, and the ends occluded. The complication rate is approximately 1-2%, and includes bleeding, infection, and development of chronic scrotal pain.  PLAN: Pending insurance approval, will schedule for vasectomy at his convenience   A total of 40 minutes were spent face-to-face with the patient, greater than 50% was spent in patient education, counseling, and coordination of care regarding risks, benefits, and alternatives to vasectomy.   Billey Co, [facility] [address] [place], [place] [address] [phone]

## 2019-01-18 NOTE — Patient Instructions (Signed)
Valium will be sent to your pharmacy, please take 26min prior to procedure

## 2019-01-30 MED FILL — IMATINIB MESYLATE 400 MG TA: 400 | 30 days supply | Qty: 30 | Fill #2

## 2019-02-20 ENCOUNTER — Telehealth: Payer: Self-pay | Admitting: Hematology

## 2019-02-20 NOTE — Telephone Encounter (Signed)
YF Call day 1/27 fu to AM. Left message.schedule mailed. Other appointments remain the same.

## 2019-02-28 MED FILL — IMATINIB MESYLATE 400 MG TA: 400 | 30 days supply | Qty: 30 | Fill #3

## 2019-03-06 ENCOUNTER — Other Ambulatory Visit: Payer: Self-pay

## 2019-03-06 ENCOUNTER — Encounter: Payer: Self-pay | Admitting: Urology

## 2019-03-06 ENCOUNTER — Ambulatory Visit (INDEPENDENT_AMBULATORY_CARE_PROVIDER_SITE_OTHER): Payer: 59 | Admitting: Urology

## 2019-03-06 VITALS — BP 152/99 | HR 119 | Ht 67.0 in | Wt 165.0 lb

## 2019-03-06 DIAGNOSIS — Z308 Encounter for other contraceptive management: Secondary | ICD-10-CM

## 2019-03-06 DIAGNOSIS — Z3009 Encounter for other general counseling and advice on contraception: Secondary | ICD-10-CM

## 2019-03-06 NOTE — Progress Notes (Signed)
VASECTOMY PROCEDURE NOTE:  The patient was taken to the minor procedure room and placed in the supine position. His genitals were prepped and draped in the usual sterile fashion. The right vas deferens was brought up to the skin of the right upper scrotum. The skin overlying it was anesthetized with 1% lidocaine without epinephrine, anesthetic was also injected alongside the vas deferens in the direction of the inguinal canal. The no scalpel vasectomy instrument was used to make a small perforation in the scrotal skin. The vasectomy clamp was used to grasp the vas deferens. It was carefully dissected free from surrounding structures. A 1cm segment of the vas was removed, and the cut ends of the mucosa were cauterized. A figure of eight suture was used to perform fascial interposition. No significant bleeding was noted. The vas deferens was returned to the scrotum. The skin incision was closed with a simple interrupted stitch of 4-0 chromic.  Attention was then turned to the left side. The left vasectomy was performed in the same exact fashion. Sterile dressings were placed over each incision. The patient tolerated the procedure well.  IMPRESSION/DIAGNOSIS: The patient is a 38 year old gentleman who underwent a vasectomy today. Post-procedure instructions were reviewed. I stressed the importance of continuing to use birth control until he provides a semen specimen more than 2 months from now that demonstrates azoospermia.  We discussed return precautions including fever over 101, significant bleeding or hematoma, or uncontrolled pain. I also stressed the importance of avoiding strenuous activity for one week, no sexual activity or ejaculations for 5 days, intermittent icing over the next 48 hours, and scrotal support.   PLAN: The patient will be advised of his semen analysis results when available.   Nickolas Madrid, MD 03/06/2019

## 2019-03-28 ENCOUNTER — Other Ambulatory Visit: Payer: Self-pay | Admitting: Hematology

## 2019-03-28 DIAGNOSIS — C49A0 Gastrointestinal stromal tumor, unspecified site: Secondary | ICD-10-CM

## 2019-03-28 NOTE — Progress Notes (Signed)
[place]   Telephone:(336) [phone] Fax:(336) [phone]   Clinic Follow up Note   Patient Care Team: Cleda Mccreedy as PCP - General (Nurse Practitioner) Elmarie Shiley, MD as Consulting Physician (Nephrology) Greer Pickerel, MD as Consulting Physician (General Surgery) [name], MD as Consulting Physician (Hematology)  Date of Service:  04/04/2019  CHIEF COMPLAINT: F/u of GIST of small intestine   CURRENT THERAPY:  imatinib(Gleevec)400mg  daily started in late July 2019  INTERVAL HISTORY:  [name] is here for a follow up of Joe Morris. He presents to the clinic alone. He notes he is doing well. He notes he has been taking Gleevec in the AM lately but had nausea from this. He notes he returned to taking it at night. He has forgotten some doses due to recent family stress. He recently separated from his wife. He is wanting to know if Gleevec can effect testosterone level. He notes normal BMs, no GI bleeding. He notes he has seen his other physicians and had recent Vasectomy.    REVIEW OF SYSTEMS:   Constitutional: Denies fevers, chills or abnormal weight loss Eyes: Denies blurriness of vision Ears, nose, mouth, throat, and face: Denies mucositis or sore throat Respiratory: Denies cough, dyspnea or wheezes Cardiovascular: Denies palpitation, chest discomfort or lower extremity swelling Gastrointestinal:  Denies nausea, heartburn or change in bowel habits Skin: Denies abnormal skin rashes Lymphatics: Denies new lymphadenopathy or easy bruising Neurological:Denies numbness, tingling or new weaknesses Behavioral/Psych: Mood is stable, no new changes  All other systems were reviewed with the patient and are negative.  MEDICAL HISTORY:  Past Medical History:  Diagnosis Date  . Anemia 07/2014  . Gastrointestinal hemorrhage with melena    [name] 07/26/2014 [facility])  . GERD (gastroesophageal reflux disease)   . gist dx'd 07/2017  . Hypertension   .  Intra-abdominal abscess ([place])    [name] 07/13/2017    SURGICAL HISTORY: Past Surgical History:  Procedure Laterality Date  . COLONOSCOPY WITH ESOPHAGOGASTRODUODENOSCOPY (EGD)  07/2014   "@ [facility]"  . IR FLUORO GUIDE CV LINE RIGHT  07/25/2017  . IR US GUIDE VASC ACCESS RIGHT  07/25/2017  . LAPAROSCOPY N/A 07/15/2017   Procedure: LAPAROSCOPY DIAGNOSTIC;  Surgeon: Greer Pickerel, MD;  Location: [facility];  Service: General;  Laterality: N/A;  . LAPAROTOMY N/A 07/15/2017   Procedure: EXPLORATORY LAPAROTOMY PROXIMAL SMALL BOWEL RESECTION, DRAINAGE OF ABDOMINAL ABSCESS;  Surgeon: Greer Pickerel, MD;  Location: [place];  Service: General;  Laterality: N/A;    I have reviewed the social history and family history with the patient and they are unchanged from previous note.  ALLERGIES:  is allergic to vancomycin and levaquin [levofloxacin].  MEDICATIONS:  Current Outpatient Medications  Medication Sig Dispense Refill  . imatinib (GLEEVEC) 400 MG tablet TAKE 1 TABLET (400 MG TOTAL) BY MOUTH DAILY. TAKE WITH MEALS AND LARGE GLASS OF WATER.CAUTION:CHEMOTHERAPY. 30 tablet 3  . Multiple Vitamin (MULTIVITAMIN WITH MINERALS) TABS tablet Take 1 tablet by mouth daily.     No current facility-administered medications for this visit.    PHYSICAL EXAMINATION: ECOG PERFORMANCE STATUS: 0 - Asymptomatic  Vitals:   04/04/19 0953  BP: 126/83  Pulse: 85  Resp: 17  Temp: 97.8 F (36.6 C)  SpO2: 100%   Filed Weights   04/04/19 0953  Weight: 169 lb 11.2 oz (77 kg)    Due to COVID19 we will limit examination to appearance. Patient had no complaints.  GENERAL:alert, no distress and comfortable SKIN: skin color normal, no  rashes or significant lesions EYES: normal, Conjunctiva are pink and non-injected, sclera clear  NEURO: alert & oriented x 3 with fluent speech   LABORATORY DATA:  I have reviewed the data as listed CBC Latest Ref Rng & Units 04/02/2019 12/01/2018 07/06/2018  WBC 4.0 - 10.5 K/uL 6.6 5.6 5.8   Hemoglobin 13.0 - 17.0 g/dL 13.2 12.5(L) 11.9(L)  Hematocrit 39.0 - 52.0 % 38.2(L) 36.1(L) 34.3(L)  Platelets 150 - 400 K/uL 306 216 197     CMP Latest Ref Rng & Units 04/02/2019 12/01/2018 07/06/2018  Glucose 70 - 99 mg/dL 94 116(H) 93  BUN 6 - 20 mg/dL 11 12 13   Creatinine 0.61 - 1.24 mg/dL 1.15 1.44(H) 1.27(H)  Sodium 135 - 145 mmol/L 140 140 140  Potassium 3.5 - 5.1 mmol/L 4.0 3.9 3.9  Chloride 98 - 111 mmol/L 103 104 104  CO2 22 - 32 mmol/L 28 28 26   Calcium 8.9 - 10.3 mg/dL 8.7(L) 9.0 9.1  Total Protein 6.5 - 8.1 g/dL 6.7 6.4(L) 6.7  Total Bilirubin 0.3 - 1.2 mg/dL 0.7 0.5 0.5  Alkaline Phos 38 - 126 U/L 52 54 55  AST 15 - 41 U/L 33 35 32  ALT 0 - 44 U/L 21 20 20     CT AP W Contrast 04/02/19 IMPRESSION: 1. Stable examination status post partial small bowel resection. 2. No evidence of local recurrence, metastatic disease or acute process.   RADIOGRAPHIC STUDIES: I have personally reviewed the radiological images as listed and agreed with the findings in the report. No results found.   ASSESSMENT & PLAN:  Joe Morris is a 39 y.o. male with    1. GIST of small intestine, pT1NxM0, stage I, low grade, with perforation -He is status post laparoscopic surgery on 07/15/17.  -He has been onadjuvantGleevec since 09/2017. He is tolerating well.He has nausea from this if taken in the AM so he takes in the PM.  -We discussed his CT AP from 04/02/19 shows no evidence of recurrence or metastatic disease.  -Continue surveillance. Continue Gleevec 400mg  daily. Plan for a total of 3 years  -Per pt request will test his testosterone level while on Gleevec. I reviewed literature, and there are some clinical data support imatinib cause hypotestosteronemia (J Oncol Pharm Pract. 2014 Aug;20(4):243-8) -F/u in 6 months. Will repeat labs at [facility] in interim.    2. History of acute Renal Failure -Occurred after surgery. He was on dialysis for 2 weeks, off now. His kidney function is  being closely followed as he recovers. Managed byhis PCP now. -I encouraged him to drink plenty of waterand encouraged him to avoid NSAID for his back pain. I recommend he use Tylenol. -Has resolved.   3. Anemia -Anemia is secondary to post surgical GI bleeding which has since resolved. He was previously treated with oral and IV iron. His anemia is being monitored by his PCP Dr. Barrie Folk -He is currently on oral iron to help muscle spasms. -His anemia has resolved (04/04/19). MCV elevation is from [place].   4. Muscle cramps/spasms  -He occasionally has spasms of muscles with activity that lasts a few seconds  -Hestillhas been taking calciumand ironsupplements, I encouraged himtake oral Magnesium, drink plenty of water and I suggest he drink Gatorade for electrolytes.  -Remains stable. I again encouraged him to take Calcium, at 8.7 today (04/04/19), he can continue Vit D. Will continue to monitor.    5. Financial Support, Social Support  -He is no longer getting outside financial assistance  for his [place]. His copay is $55. He notes this is fine for him. -Will contact his financial advocate to see if he is eligible for any other funds.  -He has been more stressed lately which is effecting his sleep. He has recently separated from his wife. He feels he is coping with this well.    PLAN: -Lab in 3 months at [facility] and F/u in 6 months  -ContinueGleevec 400 mg daily   No problem-specific Assessment & Plan notes found for this encounter.   Orders Placed This Encounter  Procedures  . Testosterone    Standing Status:   Future    Standing Expiration Date:   04/03/2020   All questions were answered. The patient knows to call the clinic with any problems, questions or concerns. No barriers to learning was detected. The total time spent in the appointment was 20 minutes.     [name], MD 04/04/2019   I, Joslyn Devon, am acting as scribe for [name], MD.    I have reviewed the above documentation for accuracy and completeness, and I agree with the above.

## 2019-04-02 ENCOUNTER — Other Ambulatory Visit: Payer: Self-pay

## 2019-04-02 ENCOUNTER — Ambulatory Visit (HOSPITAL_COMMUNITY)
Admission: RE | Admit: 2019-04-02 | Discharge: 2019-04-02 | Disposition: A | Discharge status: Home / Self Care | Payer: 59 | Source: Ambulatory Visit | Attending: Hematology | Admitting: Hematology

## 2019-04-02 ENCOUNTER — Inpatient Hospital Stay: Payer: 59 | Attending: Hematology

## 2019-04-02 ENCOUNTER — Inpatient Hospital Stay: Payer: 59

## 2019-04-02 ENCOUNTER — Encounter (HOSPITAL_COMMUNITY): Payer: Self-pay

## 2019-04-02 DIAGNOSIS — C49A3 Gastrointestinal stromal tumor of small intestine: Secondary | ICD-10-CM | POA: Insufficient documentation

## 2019-04-02 DIAGNOSIS — C49A0 Gastrointestinal stromal tumor, unspecified site: Secondary | ICD-10-CM | POA: Diagnosis not present

## 2019-04-02 HISTORY — DX: Malignant (primary) neoplasm, unspecified: C80.1

## 2019-04-02 LAB — CMP (CANCER CENTER ONLY)
ALT: 21 U/L (ref 0–44)
AST: 33 U/L (ref 15–41)
Albumin: 4.5 g/dL (ref 3.5–5.0)
Alkaline Phosphatase: 52 U/L (ref 38–126)
Anion gap: 9 (ref 5–15)
BUN: 11 mg/dL (ref 6–20)
CO2: 28 mmol/L (ref 22–32)
Calcium: 8.7 mg/dL — ABNORMAL LOW (ref 8.9–10.3)
Chloride: 103 mmol/L (ref 98–111)
Creatinine: 1.15 mg/dL (ref 0.61–1.24)
GFR, Est AFR Am: >60 mL/min (ref >60)
GFR, Estimated: >60 mL/min (ref >60)
Glucose, Bld: 94 mg/dL (ref 70–99)
Potassium: 4 mmol/L (ref 3.5–5.1)
Sodium: 140 mmol/L (ref 135–145)
Total Bilirubin: 0.7 mg/dL (ref 0.3–1.2)
Total Protein: 6.7 g/dL (ref 6.5–8.1)

## 2019-04-02 LAB — CBC WITH DIFFERENTIAL (CANCER CENTER ONLY)
Abs Immature Granulocytes: 0.03 10*3/uL (ref 0.00–0.07)
Basophils Absolute: 0.1 10*3/uL (ref 0.0–0.1)
Basophils Relative: 1 %
Eosinophils Absolute: 0.1 10*3/uL (ref 0.0–0.5)
Eosinophils Relative: 1 %
HCT: 38.2 % — ABNORMAL LOW (ref 39.0–52.0)
Hemoglobin: 13.2 g/dL (ref 13.0–17.0)
Immature Granulocytes: 1 %
Lymphocytes Relative: 18 %
Lymphs Abs: 1.2 10*3/uL (ref 0.7–4.0)
MCH: 34.7 pg — ABNORMAL HIGH (ref 26.0–34.0)
MCHC: 34.6 g/dL (ref 30.0–36.0)
MCV: 100.5 fL — ABNORMAL HIGH (ref 80.0–100.0)
Monocytes Absolute: 0.7 10*3/uL (ref 0.1–1.0)
Monocytes Relative: 11 %
Neutro Abs: 4.5 10*3/uL (ref 1.7–7.7)
Neutrophils Relative %: 68 %
Platelet Count: 306 10*3/uL (ref 150–400)
RBC: 3.8 MIL/uL — ABNORMAL LOW (ref 4.22–5.81)
RDW: 13.2 % (ref 11.5–15.5)
WBC Count: 6.6 10*3/uL (ref 4.0–10.5)
nRBC: 0 % (ref 0.0–0.2)

## 2019-04-02 MED ORDER — SODIUM CHLORIDE 0.9% FLUSH
10.0000 mL | INTRAVENOUS | Status: DC | PRN
  Filled 2019-04-02: qty 10

## 2019-04-02 MED ORDER — HEPARIN SOD (PORK) LOCK FLUSH 100 UNIT/ML IV SOLN
500.0000 [IU] | Freq: Once | INTRAVENOUS | Status: DC
  Filled 2019-04-02: qty 5

## 2019-04-02 MED ORDER — SODIUM CHLORIDE (PF) 0.9 % IJ SOLN
INTRAMUSCULAR | Status: AC
  Filled 2019-04-02: qty 50

## 2019-04-02 MED ORDER — IOHEXOL 300 MG/ML  SOLN
75.0000 mL | Freq: Once | INTRAMUSCULAR | Status: AC | PRN
  Administered 2019-04-02: 75 mL via INTRAVENOUS

## 2019-04-02 MED FILL — IMATINIB MESYLATE 400 MG TA: 400 | 30 days supply | Qty: 30 | Fill #0

## 2019-04-02 NOTE — Progress Notes (Signed)
Labs drawn already and was arrived to CT scan

## 2019-04-02 NOTE — Patient Instructions (Signed)

## 2019-04-04 ENCOUNTER — Encounter: Payer: Self-pay | Admitting: Hematology

## 2019-04-04 ENCOUNTER — Other Ambulatory Visit: Payer: Self-pay

## 2019-04-04 ENCOUNTER — Ambulatory Visit: Payer: 59 | Admitting: Hematology

## 2019-04-04 ENCOUNTER — Inpatient Hospital Stay (HOSPITAL_BASED_OUTPATIENT_CLINIC_OR_DEPARTMENT_OTHER): Payer: 59 | Admitting: Hematology

## 2019-04-04 VITALS — BP 126/83 | HR 85 | Temp 97.8°F | Resp 17 | Ht 67.0 in | Wt 169.7 lb

## 2019-04-04 DIAGNOSIS — Z79899 Other long term (current) drug therapy: Secondary | ICD-10-CM | POA: Insufficient documentation

## 2019-04-04 DIAGNOSIS — M62838 Other muscle spasm: Secondary | ICD-10-CM | POA: Insufficient documentation

## 2019-04-04 DIAGNOSIS — R252 Cramp and spasm: Secondary | ICD-10-CM | POA: Diagnosis not present

## 2019-04-04 DIAGNOSIS — C49A3 Gastrointestinal stromal tumor of small intestine: Secondary | ICD-10-CM | POA: Diagnosis not present

## 2019-04-04 DIAGNOSIS — I10 Essential (primary) hypertension: Secondary | ICD-10-CM | POA: Diagnosis not present

## 2019-04-04 DIAGNOSIS — R718 Other abnormality of red blood cells: Secondary | ICD-10-CM | POA: Insufficient documentation

## 2019-04-04 NOTE — Progress Notes (Signed)
Called patient referred by dr for assistance with [place].  Introduced myself as Arboriculturist and advised of one-time [employer] which could assist with medication cost. Based on verbal income guidelines, patient is over income. He verbalized understanding and appreciated me checking.

## 2019-04-05 ENCOUNTER — Telehealth: Payer: Self-pay | Admitting: Hematology

## 2019-04-05 NOTE — Telephone Encounter (Signed)
Scheduled appt per 1/27 los.  Sent a message to HIM pool to have a calendar mailed out. 

## 2019-05-03 DIAGNOSIS — M5126 Other intervertebral disc displacement, lumbar region: Secondary | ICD-10-CM | POA: Diagnosis not present

## 2019-05-03 MED FILL — IMATINIB MESYLATE 400 MG TA: 400 | 30 days supply | Qty: 30 | Fill #1

## 2019-05-03 MED FILL — METHYLPREDNISOLONE 4 MG TBP: 4 | 6 days supply | Qty: 21 | Fill #0

## 2019-06-04 ENCOUNTER — Other Ambulatory Visit: Payer: 59

## 2019-06-04 ENCOUNTER — Other Ambulatory Visit: Payer: Self-pay

## 2019-06-04 DIAGNOSIS — Z308 Encounter for other contraceptive management: Secondary | ICD-10-CM | POA: Diagnosis not present

## 2019-06-05 ENCOUNTER — Telehealth: Payer: Self-pay

## 2019-06-05 LAB — POST-VAS SPERM EVALUATION,QUAL: Volume: 1.5 mL

## 2019-06-05 NOTE — Telephone Encounter (Signed)
mychart notification

## 2019-06-05 NOTE — Telephone Encounter (Signed)
-----   Message from Billey Co, MD sent at 06/05/2019  2:18 PM EDT ----- No sperm seen, ok to discontinue contraception  Nickolas Madrid, MD 06/05/2019

## 2019-06-07 MED FILL — IMATINIB MESYLATE 400 MG TA: 400 | 30 days supply | Qty: 30 | Fill #2

## 2019-06-18 MED FILL — OXYCODONE-APAP 5-325MG: 5-325 | 10 days supply | Qty: 40 | Fill #0

## 2019-06-20 DIAGNOSIS — Z1152 Encounter for screening for COVID-19: Secondary | ICD-10-CM | POA: Diagnosis not present

## 2019-06-25 DIAGNOSIS — M5127 Other intervertebral disc displacement, lumbosacral region: Secondary | ICD-10-CM | POA: Diagnosis not present

## 2019-06-25 DIAGNOSIS — M5117 Intervertebral disc disorders with radiculopathy, lumbosacral region: Secondary | ICD-10-CM | POA: Diagnosis not present

## 2019-06-25 DIAGNOSIS — M5126 Other intervertebral disc displacement, lumbar region: Secondary | ICD-10-CM | POA: Diagnosis not present

## 2019-06-25 DIAGNOSIS — Z9889 Other specified postprocedural states: Secondary | ICD-10-CM | POA: Diagnosis not present

## 2019-06-25 MED FILL — CYCLOBENZAPRINE HCL 10 MG T: 10 | 30 days supply | Qty: 60 | Fill #0

## 2019-06-26 MED FILL — OXYCODONE-APAP 5-325MG: 5-325 | 10 days supply | Qty: 40 | Fill #0

## 2019-06-29 DIAGNOSIS — R5383 Other fatigue: Secondary | ICD-10-CM | POA: Diagnosis not present

## 2019-06-29 DIAGNOSIS — K219 Gastro-esophageal reflux disease without esophagitis: Secondary | ICD-10-CM | POA: Diagnosis not present

## 2019-06-29 DIAGNOSIS — R1900 Intra-abdominal and pelvic swelling, mass and lump, unspecified site: Secondary | ICD-10-CM | POA: Diagnosis not present

## 2019-06-29 DIAGNOSIS — K5900 Constipation, unspecified: Secondary | ICD-10-CM | POA: Diagnosis not present

## 2019-07-02 DIAGNOSIS — R5383 Other fatigue: Secondary | ICD-10-CM | POA: Diagnosis not present

## 2019-07-04 ENCOUNTER — Other Ambulatory Visit: Payer: Self-pay | Admitting: Physician Assistant

## 2019-07-04 DIAGNOSIS — R1900 Intra-abdominal and pelvic swelling, mass and lump, unspecified site: Secondary | ICD-10-CM

## 2019-07-05 ENCOUNTER — Other Ambulatory Visit: Payer: Self-pay | Admitting: Physician Assistant

## 2019-07-05 ENCOUNTER — Ambulatory Visit
Admission: RE | Admit: 2019-07-05 | Discharge: 2019-07-05 | Disposition: A | Discharge status: Home / Self Care | Payer: 59 | Source: Ambulatory Visit | Attending: Physician Assistant | Admitting: Physician Assistant

## 2019-07-05 DIAGNOSIS — R1900 Intra-abdominal and pelvic swelling, mass and lump, unspecified site: Secondary | ICD-10-CM

## 2019-07-05 DIAGNOSIS — R1030 Lower abdominal pain, unspecified: Secondary | ICD-10-CM | POA: Diagnosis not present

## 2019-07-09 ENCOUNTER — Other Ambulatory Visit: Payer: 59

## 2019-07-11 MED FILL — IMATINIB MESYLATE 400 MG TA: 400 | 30 days supply | Qty: 30 | Fill #3

## 2019-08-02 ENCOUNTER — Other Ambulatory Visit: Payer: Self-pay | Admitting: Nurse Practitioner

## 2019-08-02 DIAGNOSIS — C49A0 Gastrointestinal stromal tumor, unspecified site: Secondary | ICD-10-CM

## 2019-08-02 DIAGNOSIS — R1900 Intra-abdominal and pelvic swelling, mass and lump, unspecified site: Secondary | ICD-10-CM | POA: Diagnosis not present

## 2019-08-03 ENCOUNTER — Other Ambulatory Visit: Payer: Self-pay

## 2019-08-03 DIAGNOSIS — C49A0 Gastrointestinal stromal tumor, unspecified site: Secondary | ICD-10-CM

## 2019-08-08 MED FILL — IMATINIB MESYLATE 400 MG TA: 400 | 30 days supply | Qty: 30 | Fill #0

## 2019-09-12 MED FILL — IMATINIB MESYLATE 400 MG TA: 400 | 30 days supply | Qty: 30 | Fill #1

## 2019-09-27 NOTE — Progress Notes (Signed)
Joe Morris   Telephone:(336) [phone] [phone]) [phone]   Clinic Follow up Note   Patient Care Team: Lennie Odor, [place] as PCP - General (Nurse Practitioner) Elmarie Shiley, MD as Consulting Physician (Nephrology) Greer Pickerel, MD as Consulting Physician (General Surgery) [name], MD as Consulting Physician (Hematology)  Date of Service:  10/03/2019  CHIEF COMPLAINT: F/u of GIST of small intestine  CURRENT THERAPY:  imatinib(Gleevec)400mg  daily started in late July 2019  INTERVAL HISTORY:  Joe Morris is here for a follow up of GIST. He was last seen by me 6 months ago. He presents to the clinic alone. He notes he is doing well. He had lumbar spine surgery in 06/2019. Back pain resolved after surgery. He note she has small LLQ abdominal bulge with discomfort and intermittent sharp pain. This can be exacerbated by elastic bands over this location. He has 06/2019 [place] the night before his spinal surgery. Scan showed no definite abdominal wall hernia. He still has this pain, stable.  He notes he is tolerating Gleevec well. He note she has gained weight since back surgery and decreased activity.    REVIEW OF SYSTEMS:   Constitutional: Denies fevers, chills or abnormal weight loss Eyes: Denies blurriness of vision Ears, nose, mouth, throat, and face: Denies mucositis or sore throat Respiratory: Denies cough, dyspnea or wheezes Cardiovascular: Denies palpitation, chest discomfort or lower extremity swelling Gastrointestinal:  Denies nausea, heartburn or change in bowel habits (+) LLQ bulge with discomfort and intermittent sharp pain Skin: Denies abnormal skin rashes Lymphatics: Denies new lymphadenopathy or easy bruising Neurological:Denies numbness, tingling or new weaknesses Behavioral/Psych: Mood is stable, no new changes  All other systems were reviewed with the patient and are negative.  MEDICAL HISTORY:  Past Medical History:  Diagnosis Date  . Anemia 07/2014    . Gastrointestinal hemorrhage with melena    [name] 07/26/2014 [facility])  . GERD (gastroesophageal reflux disease)   . gist dx'd 07/2017  . Hypertension   . Intra-abdominal abscess ([place])    [name] 07/13/2017    SURGICAL HISTORY: Past Surgical History:  Procedure Laterality Date  . COLONOSCOPY WITH ESOPHAGOGASTRODUODENOSCOPY (EGD)  07/2014   "@ [facility]"  . IR FLUORO GUIDE CV LINE RIGHT  07/25/2017  . IR US GUIDE VASC ACCESS RIGHT  07/25/2017  . LAPAROSCOPY N/A 07/15/2017   Procedure: LAPAROSCOPY DIAGNOSTIC;  Surgeon: Greer Pickerel, MD;  Location: [facility];  Service: General;  Laterality: N/A;  . LAPAROTOMY N/A 07/15/2017   Procedure: EXPLORATORY LAPAROTOMY PROXIMAL SMALL BOWEL RESECTION, DRAINAGE OF ABDOMINAL ABSCESS;  Surgeon: Greer Pickerel, MD;  Location: [facility];  Service: General;  Laterality: N/A;    I have reviewed the social history and family history with the patient and they are unchanged from previous note.  ALLERGIES:  is allergic to vancomycin and levaquin [levofloxacin].  MEDICATIONS:  Current Outpatient Medications  Medication Sig Dispense Refill  . buPROPion (WELLBUTRIN XL) 150 MG 24 hr tablet Take 150 mg by mouth at bedtime.    Marland Kitchen imatinib (GLEEVEC) 400 MG tablet Take 1 tablet (400 mg total) by mouth daily. Take with meals and large glass of water.Caution:Chemotherapy. 30 tablet 5  . Multiple Vitamin (MULTIVITAMIN WITH MINERALS) TABS tablet Take 1 tablet by mouth daily.    . pantoprazole (PROTONIX) 40 MG tablet Take 40 mg by mouth once.     No current facility-administered medications for this visit.    PHYSICAL EXAMINATION: ECOG PERFORMANCE STATUS: 0 - Asymptomatic  Vitals:   10/03/19 1022  BP: 116/73  Pulse: 56  Resp: 17  Temp: 97.7 F (36.5 C)  SpO2: 100%   Filed Weights   10/03/19 1022  Weight: 175 lb 4.8 oz (79.5 kg)    GENERAL:alert, no distress and comfortable SKIN: skin color, texture, turgor are normal, no rashes or significant lesions EYES: normal,  Conjunctiva are pink and non-injected, sclera clear  NECK: supple, thyroid normal size, non-tender, without nodularity LYMPH:  no palpable lymphadenopathy in the cervical, axillary  LUNGS: clear to auscultation and percussion with normal breathing effort HEART: regular rate & rhythm and no murmurs and no lower extremity edema ABDOMEN:abdomen soft, non-tender and normal bowel sounds (+) Mild pronounced LLQ abdomen while standing  Musculoskeletal:no cyanosis of digits and no clubbing  NEURO: alert & oriented x 3 with fluent speech, no focal motor/sensory deficits  LABORATORY DATA:  I have reviewed the data as listed CBC Latest Ref Rng & Units 10/03/2019 04/02/2019 12/01/2018  WBC 4.0 - 10.5 K/uL 5.0 6.6 5.6  Hemoglobin 13.0 - 17.0 g/dL 14.0 13.2 12.5(L)  Hematocrit 39 - 52 % 40.5 38.2(L) 36.1(L)  Platelets 150 - 400 K/uL 256 306 216     CMP Latest Ref Rng & Units 10/03/2019 04/02/2019 12/01/2018  Glucose 70 - 99 mg/dL 92 94 116(H)  BUN 6 - 20 mg/dL 10 11 12   Creatinine 0.61 - 1.24 mg/dL 1.19 1.15 1.44(H)  Sodium 135 - 145 mmol/L 140 140 140  Potassium 3.5 - 5.1 mmol/L 4.4 4.0 3.9  Chloride 98 - 111 mmol/L 106 103 104  CO2 22 - 32 mmol/L 29 28 28   Calcium 8.9 - 10.3 mg/dL 9.7 8.7(L) 9.0  Total Protein 6.5 - 8.1 g/dL 6.8 6.7 6.4(L)  Total Bilirubin 0.3 - 1.2 mg/dL 0.7 0.7 0.5  Alkaline Phos 38 - 126 U/L 63 52 54  AST 15 - 41 U/L 29 33 35  ALT 0 - 44 U/L 20 21 20       RADIOGRAPHIC STUDIES: I have personally reviewed the radiological images as listed and agreed with the findings in the report. No results found.   ASSESSMENT & PLAN:  Joe Morris is a 39 y.o. male with    1. GIST of small intestine, pT1NxM0, stage I, low grade, with perforation -He is status post laparoscopic surgery on 07/15/17.  -He has been onadjuvantGleevec since 09/2017. He is tolerating well.He has nausea from this if taken in the AM so he takes in the PM.  -Since 06/2019 he has LLQ abdominal bulge with  discomfort and intermittent sharp pain. No true hernia seen on 07/05/19 US Pelvis.  -his symptoms have persisted but stable. Bulge is present on exam today when standing but otherwise exam was negative. I recommend CT AP for further evaluation in 2 weeks given his history of GIST. If negative, this is likely abdominal wall hernia and he can f/u with his surgeon Dr Redmond Pulling. He is agreeable.  -He is otherwise clinically doing well. Labs reviewed, CBC and CMP WNL. Overall adequate to continued Gleevec 400mg  daily. Plan for a total of 3 years  -f/u in 3 months    2.History of acute Renal Failure after surgery. Required 2 weeks of Dialysis. Now resolved.    PLAN: -CT AP w contrast in 2 weeks at [facility]. I will call him with results.  -Lab and F/u in 3 months if CT negative. See Dr. Redmond Pulling if needed   -ContinueGleevec 400 mgdaily, refilled today    No problem-specific Assessment & Plan notes  found for this encounter.   Orders Placed This Encounter  Procedures  . CT Abdomen Pelvis W Contrast    Recent intermittent mid left abdominal pain, rule out recurrence of GIST    Standing Status:   Future    Standing Expiration Date:   10/02/2020    Order Specific Question:   If indicated for the ordered procedure, I authorize the administration of contrast media per Radiology protocol    Answer:   Yes    Order Specific Question:   Preferred imaging location?    Answer:   [facility]    Order Specific Question:   Release to patient    Answer:   Immediate    Order Specific Question:   Is Oral Contrast requested for this exam?    Answer:   Yes, Per Radiology protocol    Order Specific Question:   Radiology Contrast Protocol - do NOT remove file path    Answer:   [email or web address]   All questions were answered. The patient knows to call the clinic with any problems, questions or concerns. No barriers to learning was detected.      [name],  MD 10/03/2019   I, Joslyn Devon, am acting as scribe for [name], MD.   I have reviewed the above documentation for accuracy and completeness, and I agree with the above.

## 2019-10-03 ENCOUNTER — Inpatient Hospital Stay: Payer: 59

## 2019-10-03 ENCOUNTER — Other Ambulatory Visit: Payer: Self-pay

## 2019-10-03 ENCOUNTER — Encounter: Payer: Self-pay | Admitting: Hematology

## 2019-10-03 ENCOUNTER — Other Ambulatory Visit: Payer: Self-pay | Admitting: Hematology

## 2019-10-03 ENCOUNTER — Inpatient Hospital Stay: Payer: 59 | Attending: Hematology | Admitting: Hematology

## 2019-10-03 VITALS — BP 116/73 | HR 56 | Temp 97.7°F | Resp 17 | Ht 67.0 in | Wt 175.3 lb

## 2019-10-03 DIAGNOSIS — Z79899 Other long term (current) drug therapy: Secondary | ICD-10-CM | POA: Insufficient documentation

## 2019-10-03 DIAGNOSIS — C49A3 Gastrointestinal stromal tumor of small intestine: Secondary | ICD-10-CM

## 2019-10-03 DIAGNOSIS — C49A0 Gastrointestinal stromal tumor, unspecified site: Secondary | ICD-10-CM

## 2019-10-03 DIAGNOSIS — I10 Essential (primary) hypertension: Secondary | ICD-10-CM | POA: Diagnosis not present

## 2019-10-03 LAB — CBC WITH DIFFERENTIAL (CANCER CENTER ONLY)
Abs Immature Granulocytes: 0.02 10*3/uL (ref 0.00–0.07)
Basophils Absolute: 0 10*3/uL (ref 0.0–0.1)
Basophils Relative: 1 %
Eosinophils Absolute: 0.1 10*3/uL (ref 0.0–0.5)
Eosinophils Relative: 3 %
HCT: 40.5 % (ref 39.0–52.0)
Hemoglobin: 14 g/dL (ref 13.0–17.0)
Immature Granulocytes: 0 %
Lymphocytes Relative: 30 %
Lymphs Abs: 1.5 10*3/uL (ref 0.7–4.0)
MCH: 33.4 pg (ref 26.0–34.0)
MCHC: 34.6 g/dL (ref 30.0–36.0)
MCV: 96.7 fL (ref 80.0–100.0)
Monocytes Absolute: 0.5 10*3/uL (ref 0.1–1.0)
Monocytes Relative: 11 %
Neutro Abs: 2.8 10*3/uL (ref 1.7–7.7)
Neutrophils Relative %: 55 %
Platelet Count: 256 10*3/uL (ref 150–400)
RBC: 4.19 MIL/uL — ABNORMAL LOW (ref 4.22–5.81)
RDW: 12.7 % (ref 11.5–15.5)
WBC Count: 5 10*3/uL (ref 4.0–10.5)
nRBC: 0 % (ref 0.0–0.2)

## 2019-10-03 LAB — CMP (CANCER CENTER ONLY)
ALT: 20 U/L (ref 0–44)
AST: 29 U/L (ref 15–41)
Albumin: 4.5 g/dL (ref 3.5–5.0)
Alkaline Phosphatase: 63 U/L (ref 38–126)
Anion gap: 5 (ref 5–15)
BUN: 10 mg/dL (ref 6–20)
CO2: 29 mmol/L (ref 22–32)
Calcium: 9.7 mg/dL (ref 8.9–10.3)
Chloride: 106 mmol/L (ref 98–111)
Creatinine: 1.19 mg/dL (ref 0.61–1.24)
GFR, Est AFR Am: >60 mL/min (ref >60)
GFR, Estimated: >60 mL/min (ref >60)
Glucose, Bld: 92 mg/dL (ref 70–99)
Potassium: 4.4 mmol/L (ref 3.5–5.1)
Sodium: 140 mmol/L (ref 135–145)
Total Bilirubin: 0.7 mg/dL (ref 0.3–1.2)
Total Protein: 6.8 g/dL (ref 6.5–8.1)

## 2019-10-03 MED ORDER — IMATINIB MESYLATE 400 MG PO TABS: 400.0000 mg | ORAL_TABLET | Freq: Every day | ORAL | 5 refills | Status: DC

## 2019-10-04 ENCOUNTER — Telehealth: Payer: Self-pay | Admitting: Hematology

## 2019-10-04 LAB — TESTOSTERONE: Testosterone: 391 ng/dL (ref 264–916)

## 2019-10-04 NOTE — Telephone Encounter (Signed)
Scheduled per 7/29 los. Unable to reach pt. Left voicemail with appt time and date.

## 2019-10-25 DIAGNOSIS — F411 Generalized anxiety disorder: Secondary | ICD-10-CM | POA: Diagnosis not present

## 2019-10-25 DIAGNOSIS — K219 Gastro-esophageal reflux disease without esophagitis: Secondary | ICD-10-CM | POA: Diagnosis not present

## 2019-10-25 DIAGNOSIS — C49A3 Gastrointestinal stromal tumor of small intestine: Secondary | ICD-10-CM | POA: Diagnosis not present

## 2019-10-25 DIAGNOSIS — Z13220 Encounter for screening for lipoid disorders: Secondary | ICD-10-CM | POA: Diagnosis not present

## 2019-10-25 DIAGNOSIS — Z0000 Encounter for general adult medical examination without abnormal findings: Secondary | ICD-10-CM | POA: Diagnosis not present

## 2019-11-05 MED FILL — IMATINIB MESYLATE 400 MG TA: 400 | 30 days supply | Qty: 30 | Fill #0

## 2019-12-06 MED FILL — IMATINIB MESYLATE 400 MG TA: 400 | 30 days supply | Qty: 30 | Fill #1

## 2019-12-10 IMAGING — CT CT ABD-PELV W/ CM
2 of 5 series · 15 of 46 positions shown, 17 images · IV contrast (APPLIED)
Comparison: None.

CLINICAL DATA: 37-year-old male with abdominal pain. Initial
encounter.

EXAM:
CT ABDOMEN AND PELVIS WITH CONTRAST
TECHNIQUE: Multidetector CT imaging of the abdomen and pelvis was performed
using the standard protocol following bolus administration of
intravenous contrast.
CONTRAST:  100mL OMNIPAQUE IOHEXOL 300 MG/ML  SOLN

[Series 3: abdomen 5.0 · axial · 0.70mm/px · z∈[+948,+1348]mm · 12 of 94 slices shown, 14 images]
[im 7/94  soft-tissue]
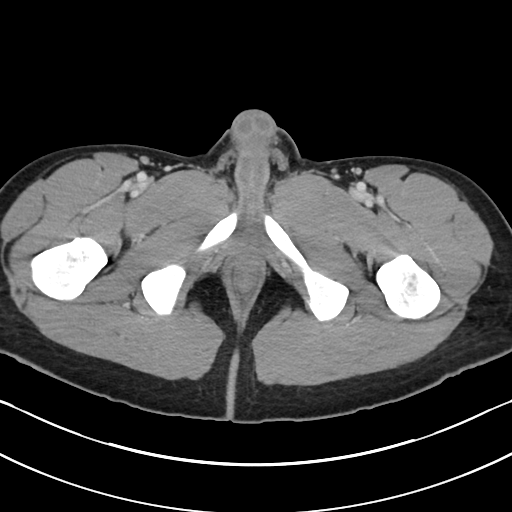
[im 7/94  bone]
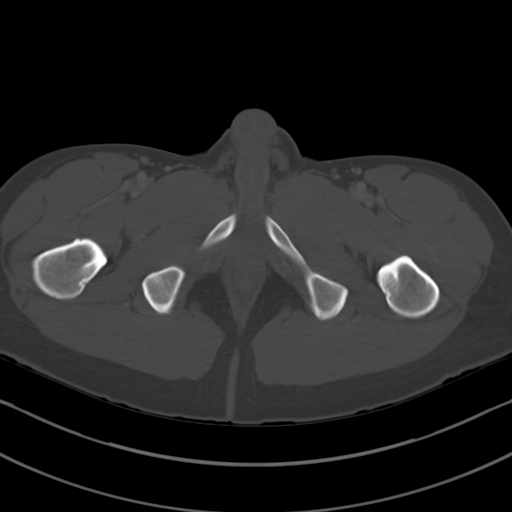
[im 14/94  soft-tissue]
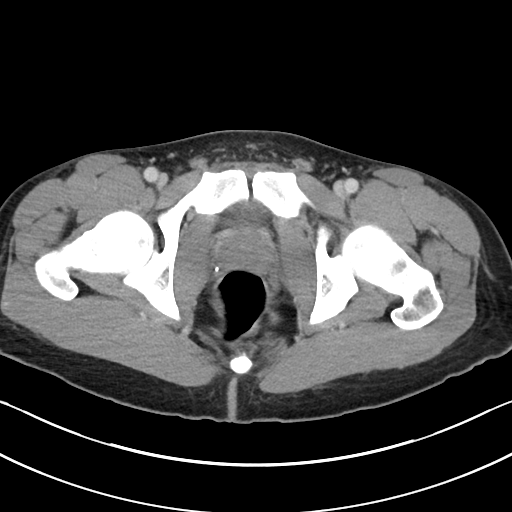
[im 20/94  soft-tissue]
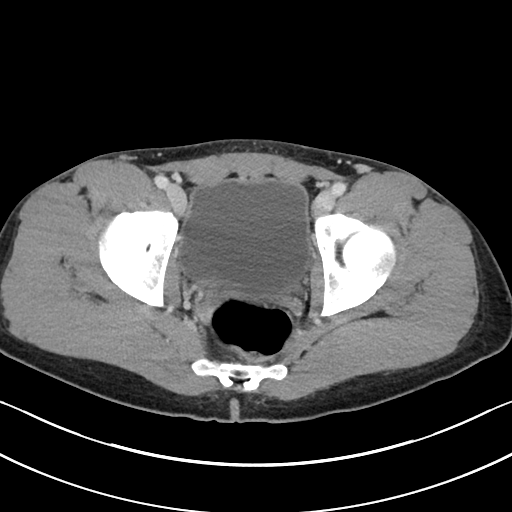
[im 27/94  soft-tissue]
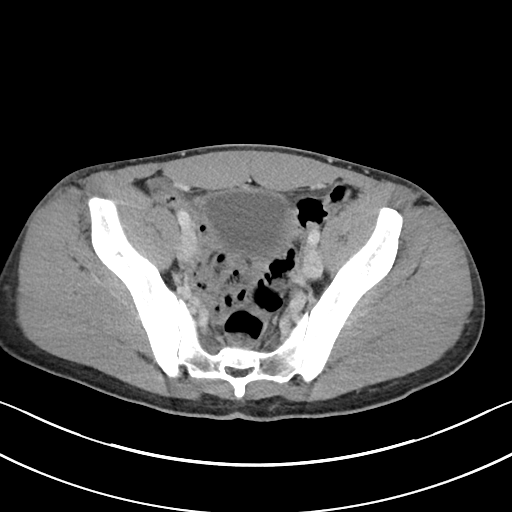
[im 34/94  soft-tissue]
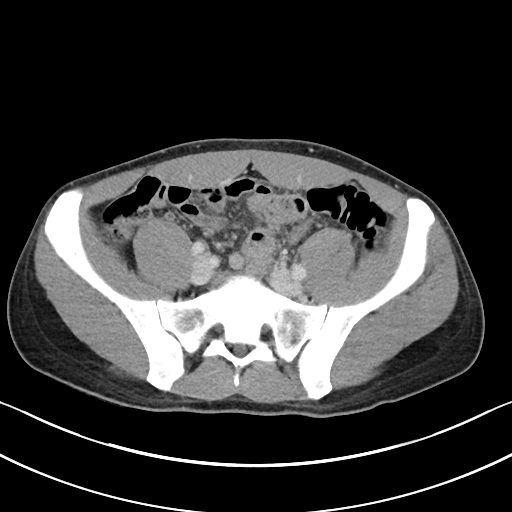
[im 40/94  soft-tissue]
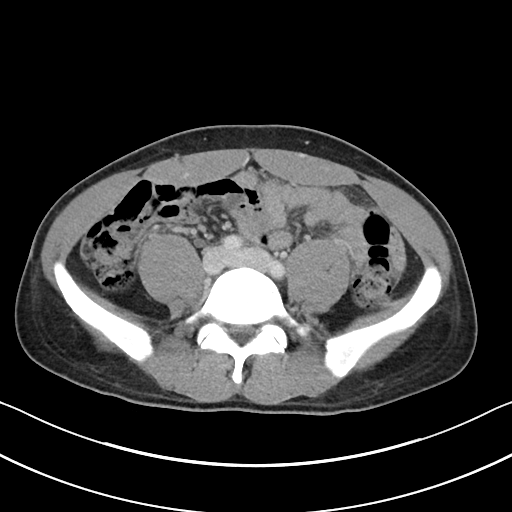
[im 54/94  soft-tissue]
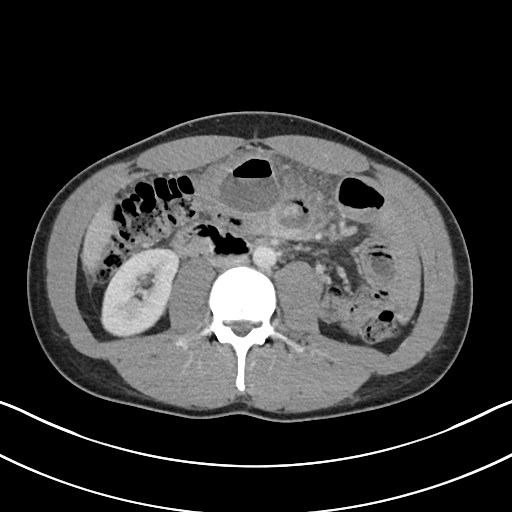
[im 60/94  soft-tissue]
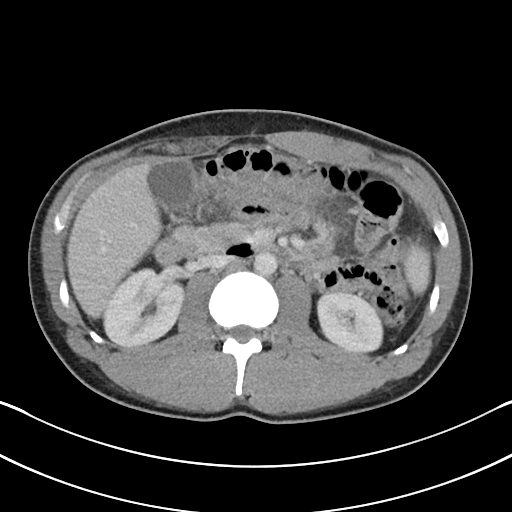
[im 67/94  soft-tissue]
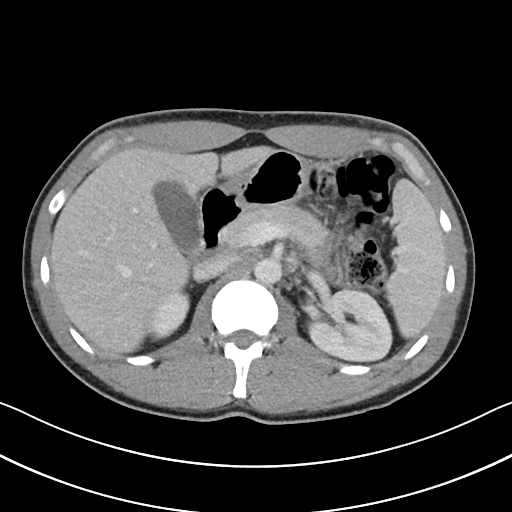
[im 67/94  bone]
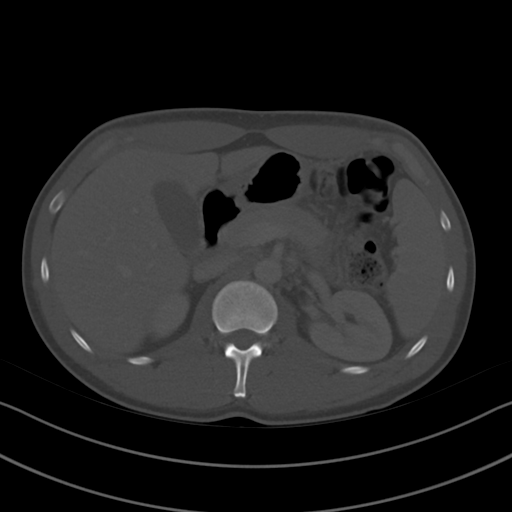
[im 74/94  soft-tissue]
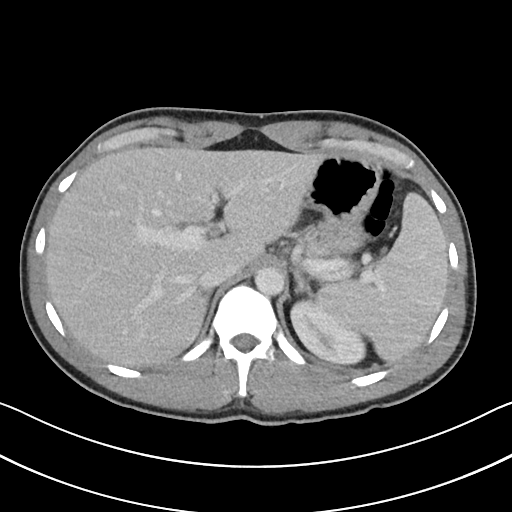
[im 80/94  soft-tissue]
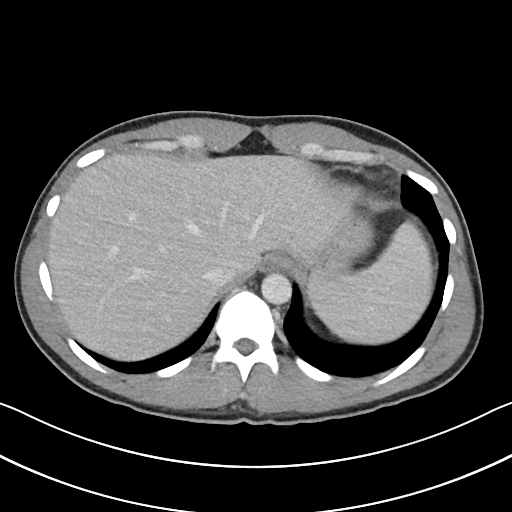
[im 87/94  soft-tissue]
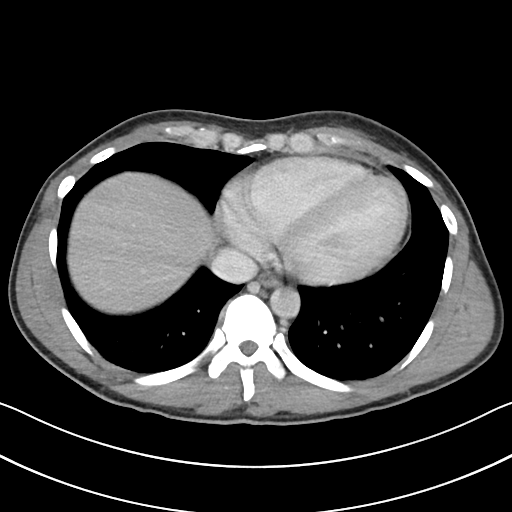

[Series 6: abdomen 3.0 mpr cor · coronal · 0.67mm/px · 3 of 80 slices shown]
[im 27/80  soft-tissue]
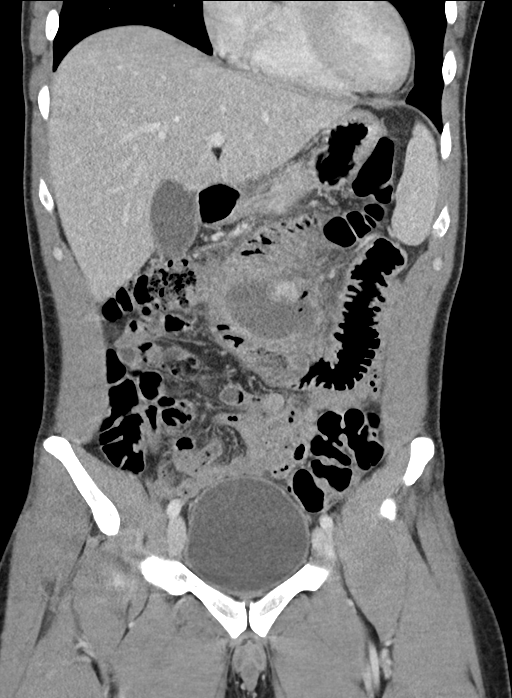
[im 36/80  soft-tissue]
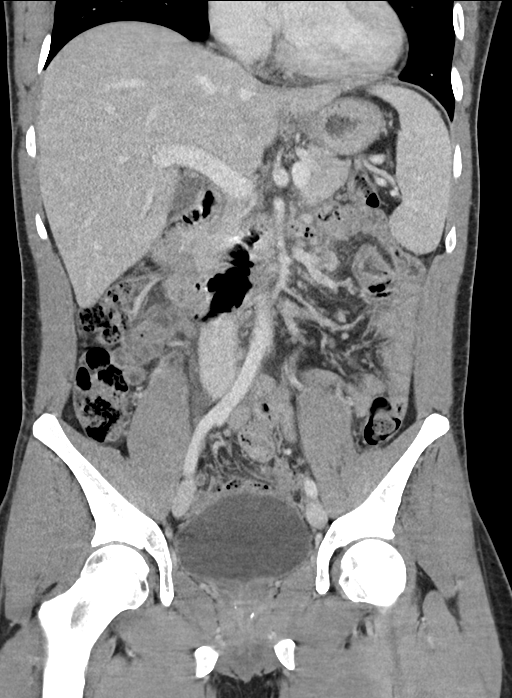
[im 44/80  soft-tissue]
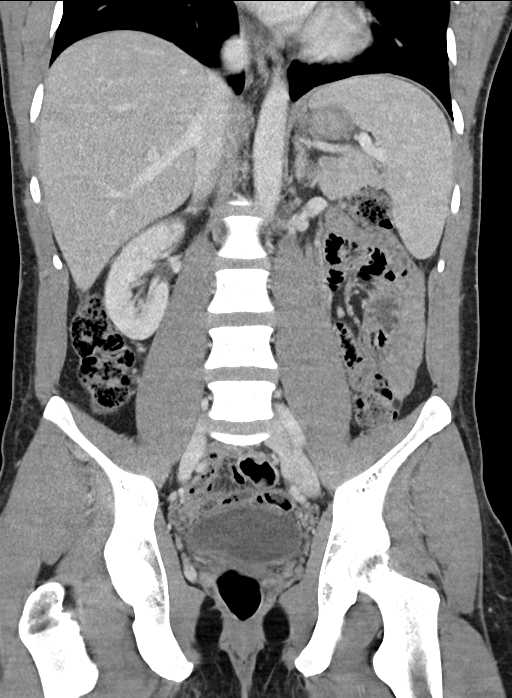

[15 of 46 positions shown; findings below may reference images not displayed]

FINDINGS: Lower chest: No lung base abnormality.  Heart size top-normal.

Hepatobiliary: Enlarged liver spanning over 19.2 cm. No focal
hepatic lesion. No calcified gallstone or common bile duct stone.

Pancreas: No pancreatic mass or primary pancreatic inflammation.

Spleen: Spleen top-normal size.  No splenic mass.

Adrenals/Urinary Tract: No obstructing stone or hydronephrosis. No
worrisome renal or adrenal mass. Noncontrast

Stomach/Bowel: Complex 7.4 x 5.1 x 4.3 cm abscess central/left
paracentral aspect of the abdomen displacing proximal small bowel
loops and reaching the undersurface of the transverse colon.
Prominent vascular blush along the posterior superior aspect.
Significant inflammation of surrounding fat planes. Etiology
indeterminate. The most abnormal appearing bowel is the proximal
small bowel. This abscess may reflect small bowel perforation,
possibly from ingested material, underlying inflammatory process and
less likely (although not excluded), underlying mass. I suspect
involvement of the undersurface of the transverse colon is
secondary. Adjacent lymph nodes probably reactive in origin.

Mass effect upon the superior mesenteric vein which is narrowed.

Appendix partially fluid-filled and separate from the abscess. No
inflammation surrounds the appendix.

Vascular/Lymphatic: No abdominal aortic aneurysm or large arterial
vessel occlusion.

Lymph nodes as noted above.

Reproductive: Negative

Other: No free air separate from abscess.

Musculoskeletal: No worrisome osseous lesion.
IMPRESSION: Complex 7.4 x 5.1 x 4.3 cm abscess central/left paracentral aspect
of the abdomen displacing proximal small bowel loops and reaching
the undersurface of the transverse colon. Prominent vascular blush
along the posterior superior aspect. Significant inflammation of
surrounding fat planes. Etiology indeterminate. The most abnormal
appearing bowel is the proximal small bowel. This abscess may
reflect small bowel perforation, possibly from ingested material,
underlying inflammatory process and less likely (although not
excluded), underlying mass. I suspect involvement of the
undersurface of the transverse colon is secondary. Adjacent lymph
nodes probably reactive in origin.

Mass effect upon the superior mesenteric vein which is narrowed.

Enlarged liver spanning over 19.2 cm.

These results were called by telephone at the time of interpretation
on 07/13/2017 at [DATE] to Dr. RYOMA SAE , who verbally
acknowledged these results. Request for surgical consultation.

## 2019-12-31 NOTE — Progress Notes (Signed)
[facility]   Telephone:(336) [phone] Fax:(336) [phone]   Clinic Follow up Note   Patient Care Team: Lennie Odor, [place] as PCP - General (Nurse Practitioner) Elmarie Shiley, MD as Consulting Physician (Nephrology) Greer Pickerel, MD as Consulting Physician (General Surgery) [name], MD as Consulting Physician (Hematology)  Date of Service:  01/03/2020  CHIEF COMPLAINT:  F/u of GIST of small intestine  CURRENT THERAPY:  imatinib(Gleevec)400mg  daily started in late July 2019  INTERVAL HISTORY:  Joe Morris is here for a follow up of GIST. He presents to the clinic alone.  He is clinically doing very well.  His left-sided abdominal pain resolved after his last visit with me, so he did not pursue the CT scan due to high out-of-pocket cost.  He is tolerating Gleevec very well, no noticeable side effects, except mild cramps in hands and legs, for which he takes over-the-counter potassium and magnesium.  He states he is probably not drinking fluids adequately due to busy work.  His appetite and energy level are normal, no other concerns.  All other systems were reviewed with the patient and are negative.  MEDICAL HISTORY:  Past Medical History:  Diagnosis Date  . Anemia 07/2014  . Gastrointestinal hemorrhage with melena    [name] 07/26/2014 [facility])  . GERD (gastroesophageal reflux disease)   . gist dx'd 07/2017  . Hypertension   . Intra-abdominal abscess ([place])    [name] 07/13/2017    SURGICAL HISTORY: Past Surgical History:  Procedure Laterality Date  . COLONOSCOPY WITH ESOPHAGOGASTRODUODENOSCOPY (EGD)  07/2014   "@ [facility]"  . IR FLUORO GUIDE CV LINE RIGHT  07/25/2017  . IR US GUIDE VASC ACCESS RIGHT  07/25/2017  . LAPAROSCOPY N/A 07/15/2017   Procedure: LAPAROSCOPY DIAGNOSTIC;  Surgeon: Greer Pickerel, MD;  Location: [facility];  Service: General;  Laterality: N/A;  . LAPAROTOMY N/A 07/15/2017   Procedure: EXPLORATORY LAPAROTOMY PROXIMAL SMALL BOWEL RESECTION, DRAINAGE OF  ABDOMINAL ABSCESS;  Surgeon: Greer Pickerel, MD;  Location: [facility];  Service: General;  Laterality: N/A;    I have reviewed the social history and family history with the patient and they are unchanged from previous note.  ALLERGIES:  is allergic to vancomycin and levaquin [levofloxacin].  MEDICATIONS:  Current Outpatient Medications  Medication Sig Dispense Refill  . buPROPion (WELLBUTRIN XL) 150 MG 24 hr tablet Take 150 mg by mouth at bedtime.    Marland Kitchen imatinib (GLEEVEC) 400 MG tablet Take 1 tablet (400 mg total) by mouth daily. Take with meals and large glass of water.Caution:Chemotherapy. 30 tablet 5  . Multiple Vitamin (MULTIVITAMIN WITH MINERALS) TABS tablet Take 1 tablet by mouth daily.    . pantoprazole (PROTONIX) 40 MG tablet Take 40 mg by mouth once.     No current facility-administered medications for this visit.    PHYSICAL EXAMINATION: ECOG PERFORMANCE STATUS: 0 - Asymptomatic  Vitals:   01/03/20 1054  BP: 112/65  Pulse: 66  Resp: 18  Temp: 97.8 F (36.6 C)  SpO2: 99%   Filed Weights   01/03/20 1054  Weight: 176 lb 1.6 oz (79.9 kg)    GENERAL:alert, no distress and comfortable SKIN: skin color, texture, turgor are normal, no rashes or significant lesions EYES: normal, Conjunctiva are pink and non-injected, sclera clear NECK: supple, thyroid normal size, non-tender, without nodularity LYMPH:  no palpable lymphadenopathy in the cervical, axillary  LUNGS: clear to auscultation and percussion with normal breathing effort HEART: regular rate & rhythm and no murmurs and no lower extremity  edema ABDOMEN:abdomen soft, non-tender and normal bowel sounds Musculoskeletal:no cyanosis of digits and no clubbing  NEURO: alert & oriented x 3 with fluent speech, no focal motor/sensory deficits  LABORATORY DATA:  I have reviewed the data as listed CBC Latest Ref Rng & Units 01/03/2020 10/03/2019 04/02/2019  WBC 4.0 - 10.5 K/uL 4.4 5.0 6.6  Hemoglobin 13.0 - 17.0 g/dL 12.6(L) 14.0  13.2  Hematocrit 39 - 52 % 36.2(L) 40.5 38.2(L)  Platelets 150 - 400 K/uL 222 256 306     CMP Latest Ref Rng & Units 01/03/2020 10/03/2019 04/02/2019  Glucose 70 - 99 mg/dL 76 92 94  BUN 6 - 20 mg/dL 16 10 11   Creatinine 0.61 - 1.24 mg/dL 1.26(H) 1.19 1.15  Sodium 135 - 145 mmol/L 141 140 140  Potassium 3.5 - 5.1 mmol/L 4.3 4.4 4.0  Chloride 98 - 111 mmol/L 105 106 103  CO2 22 - 32 mmol/L 29 29 28   Calcium 8.9 - 10.3 mg/dL 9.4 9.7 8.7(L)  Total Protein 6.5 - 8.1 g/dL 6.4(L) 6.8 6.7  Total Bilirubin 0.3 - 1.2 mg/dL 0.6 0.7 0.7  Alkaline Phos 38 - 126 U/L 70 63 52  AST 15 - 41 U/L 33 29 33  ALT 0 - 44 U/L 23 20 21       RADIOGRAPHIC STUDIES: I have personally reviewed the radiological images as listed and agreed with the findings in the report. No results found.   ASSESSMENT & PLAN:  Joe Morris is a 39 y.o. male with    1. GIST of small intestine, pT1NxM0, stage I, low grade, with perforation -He is status post laparoscopic surgery on 07/15/17.  -He has been onadjuvantGleevec since 09/2017. He is tolerating well.He has nausea from this if taken in the AM so he takes in the PM.  -Since 06/2019 he has LLQ abdominal bulge with discomfort and intermittent sharp pain. No true hernia seen on 07/05/19 US Pelvis.   This resolved spontaneously. -He is clinically doing very well, asymptomatic, reviewed, mildly elevated creatinine probably related to inadequate hydration.  Exam was unremarkable -Follow-up in 4 months with a surveillance CT abdomen pelvis with contrast. -He will complete 3 years of Gleevec in July 2022. -Follow-up every 6 months after next visit.   2.History of acute Renal Failure after surgery. Required 2 weeks of Dialysis. Now resolved.    PLAN: -He is clinically doing very well, continue Gleevec 400 mg daily -lab and Follow-up in 4 months, with CT abdomen pelvis with contrast in [facility] a few days before.    No problem-specific  Assessment & Plan notes found for this encounter.   Orders Placed This Encounter  Procedures  . CT Abdomen Pelvis W Contrast    Standing Status:   Future    Standing Expiration Date:   01/02/2021    Order Specific Question:   If indicated for the ordered procedure, I authorize the administration of contrast media per Radiology protocol    Answer:   Yes    Order Specific Question:   Preferred imaging location?    Answer:   [facility]    Order Specific Question:   Release to patient    Answer:   Immediate    Order Specific Question:   Is Oral Contrast requested for this exam?    Answer:   Yes, Per Radiology protocol   All questions were answered. The patient knows to call the clinic with any problems, questions or concerns. No barriers to learning was  detected.      [name], MD 01/03/2020   I, Joslyn Devon, am acting as scribe for [name], MD.   I have reviewed the above documentation for accuracy and completeness, and I agree with the above.

## 2020-01-03 ENCOUNTER — Encounter: Payer: Self-pay | Admitting: Hematology

## 2020-01-03 ENCOUNTER — Inpatient Hospital Stay: Payer: 59 | Attending: Hematology | Admitting: Hematology

## 2020-01-03 ENCOUNTER — Inpatient Hospital Stay: Payer: 59

## 2020-01-03 ENCOUNTER — Other Ambulatory Visit: Payer: Self-pay

## 2020-01-03 VITALS — BP 112/65 | HR 66 | Temp 97.8°F | Resp 18 | Ht 67.0 in | Wt 176.1 lb

## 2020-01-03 DIAGNOSIS — C49A3 Gastrointestinal stromal tumor of small intestine: Secondary | ICD-10-CM | POA: Diagnosis not present

## 2020-01-03 DIAGNOSIS — C49A0 Gastrointestinal stromal tumor, unspecified site: Secondary | ICD-10-CM

## 2020-01-03 DIAGNOSIS — Z79899 Other long term (current) drug therapy: Secondary | ICD-10-CM | POA: Diagnosis not present

## 2020-01-03 LAB — CBC WITH DIFFERENTIAL (CANCER CENTER ONLY)
Abs Immature Granulocytes: 0.01 10*3/uL (ref 0.00–0.07)
Basophils Absolute: 0 10*3/uL (ref 0.0–0.1)
Basophils Relative: 1 %
Eosinophils Absolute: 0.1 10*3/uL (ref 0.0–0.5)
Eosinophils Relative: 2 %
HCT: 36.2 % — ABNORMAL LOW (ref 39.0–52.0)
Hemoglobin: 12.6 g/dL — ABNORMAL LOW (ref 13.0–17.0)
Immature Granulocytes: 0 %
Lymphocytes Relative: 20 %
Lymphs Abs: 0.9 10*3/uL (ref 0.7–4.0)
MCH: 34 pg (ref 26.0–34.0)
MCHC: 34.8 g/dL (ref 30.0–36.0)
MCV: 97.6 fL (ref 80.0–100.0)
Monocytes Absolute: 0.5 10*3/uL (ref 0.1–1.0)
Monocytes Relative: 11 %
Neutro Abs: 2.9 10*3/uL (ref 1.7–7.7)
Neutrophils Relative %: 66 %
Platelet Count: 222 10*3/uL (ref 150–400)
RBC: 3.71 MIL/uL — ABNORMAL LOW (ref 4.22–5.81)
RDW: 12.8 % (ref 11.5–15.5)
WBC Count: 4.4 10*3/uL (ref 4.0–10.5)
nRBC: 0 % (ref 0.0–0.2)

## 2020-01-03 LAB — CMP (CANCER CENTER ONLY)
ALT: 23 U/L (ref 0–44)
AST: 33 U/L (ref 15–41)
Albumin: 4.3 g/dL (ref 3.5–5.0)
Alkaline Phosphatase: 70 U/L (ref 38–126)
Anion gap: 7 (ref 5–15)
BUN: 16 mg/dL (ref 6–20)
CO2: 29 mmol/L (ref 22–32)
Calcium: 9.4 mg/dL (ref 8.9–10.3)
Chloride: 105 mmol/L (ref 98–111)
Creatinine: 1.26 mg/dL — ABNORMAL HIGH (ref 0.61–1.24)
GFR, Estimated: >60 mL/min (ref >60)
Glucose, Bld: 76 mg/dL (ref 70–99)
Potassium: 4.3 mmol/L (ref 3.5–5.1)
Sodium: 141 mmol/L (ref 135–145)
Total Bilirubin: 0.6 mg/dL (ref 0.3–1.2)
Total Protein: 6.4 g/dL — ABNORMAL LOW (ref 6.5–8.1)

## 2020-01-04 ENCOUNTER — Telehealth: Payer: Self-pay | Admitting: Hematology

## 2020-01-04 NOTE — Telephone Encounter (Signed)
Scheduled per 10/28 los. Unable to reach pt. Left voicemail with appt times and date.

## 2020-01-07 MED FILL — IMATINIB MESYLATE 400 MG TA: 400 | 30 days supply | Qty: 30 | Fill #2

## 2020-02-06 MED FILL — IMATINIB MESYLATE 400 MG TA: 400 | 30 days supply | Qty: 30 | Fill #3

## 2020-03-04 MED FILL — IMATINIB MESYLATE 400 MG TA: 400 | 30 days supply | Qty: 30 | Fill #4

## 2020-04-07 MED FILL — IMATINIB MESYLATE 400 MG TA: 400 | 30 days supply | Qty: 30 | Fill #5

## 2020-04-23 ENCOUNTER — Encounter: Payer: Self-pay | Admitting: Hematology

## 2020-05-02 NOTE — Progress Notes (Signed)
Joe Morris   Telephone:(336) [phone] Fax:(336) [phone]   Clinic Follow up Note   Patient Care Team: Lennie Odor, [place] as PCP - General (Nurse Practitioner) Elmarie Shiley, MD as Consulting Physician (Nephrology) Greer Pickerel, MD as Consulting Physician (General Surgery) [name], MD as Consulting Physician (Hematology)  Date of Service:  05/05/2020  CHIEF COMPLAINT: F/u of GIST of small intestine   PREVIOUS THERAPY:  imatinib(Gleevec)400mg  daily started in late July 2019. Stopped 05/05/20 due to GI side effects.   CURRENT THERAPY:  Surveillance    INTERVAL HISTORY:  Joe Morris is here for a follow up of GIST. He presents to the clinic alone. He notes he is doing well. He notes he is ready to stop Gleevec. He takes this at night and for the last few nights he will have severe gas and mild diarrhea that wakes him up. If he takes it in the morning, this will lead to nausea.     REVIEW OF SYSTEMS:   Constitutional: Denies fevers, chills or abnormal weight loss Eyes: Denies blurriness of vision Ears, nose, mouth, throat, and face: Denies mucositis or sore throat Respiratory: Denies cough, dyspnea or wheezes Cardiovascular: Denies palpitation, chest discomfort or lower extremity swelling Gastrointestinal:  Denies nausea, heartburn or change in bowel habits (+) gas (+) mild diarrhea  Skin: Denies abnormal skin rashes Lymphatics: Denies new lymphadenopathy or easy bruising Neurological:Denies numbness, tingling or new weaknesses Behavioral/Psych: Mood is stable, no new changes  All other systems were reviewed with the patient and are negative.  MEDICAL HISTORY:  Past Medical History:  Diagnosis Date  . Anemia 07/2014  . Gastrointestinal hemorrhage with melena    [name] 07/26/2014 [facility])  . GERD (gastroesophageal reflux disease)   . gist dx'd 07/2017  . Hypertension   . Intra-abdominal abscess ([place])    [name] 07/13/2017    SURGICAL HISTORY: Past Surgical  History:  Procedure Laterality Date  . COLONOSCOPY WITH ESOPHAGOGASTRODUODENOSCOPY (EGD)  07/2014   "@ [facility]"  . IR FLUORO GUIDE CV LINE RIGHT  07/25/2017  . IR US GUIDE VASC ACCESS RIGHT  07/25/2017  . LAPAROSCOPY N/A 07/15/2017   Procedure: LAPAROSCOPY DIAGNOSTIC;  Surgeon: Greer Pickerel, MD;  Location: [place];  Service: General;  Laterality: N/A;  . LAPAROTOMY N/A 07/15/2017   Procedure: EXPLORATORY LAPAROTOMY PROXIMAL SMALL BOWEL RESECTION, DRAINAGE OF ABDOMINAL ABSCESS;  Surgeon: Greer Pickerel, MD;  Location: [place];  Service: General;  Laterality: N/A;    I have reviewed the social history and family history with the patient and they are unchanged from previous note.  ALLERGIES:  is allergic to vancomycin and levaquin [levofloxacin].  MEDICATIONS:  Current Outpatient Medications  Medication Sig Dispense Refill  . buPROPion (WELLBUTRIN XL) 150 MG 24 hr tablet Take 150 mg by mouth at bedtime.    Marland Kitchen imatinib (GLEEVEC) 400 MG tablet Take 1 tablet (400 mg total) by mouth daily. Take with meals and large glass of water.Caution:Chemotherapy. 30 tablet 5  . Multiple Vitamin (MULTIVITAMIN WITH MINERALS) TABS tablet Take 1 tablet by mouth daily.    . pantoprazole (PROTONIX) 40 MG tablet Take 40 mg by mouth once.     No current facility-administered medications for this visit.    PHYSICAL EXAMINATION: ECOG PERFORMANCE STATUS: 1 - Symptomatic but completely ambulatory  Vitals:   05/05/20 1301  BP: 123/72  Pulse: 60  Resp: 13  Temp: (!) 97.5 F (36.4 C)  SpO2: 100%   Filed Weights   05/05/20 1301  Weight: 180  lb 8 oz (81.9 kg)    GENERAL:alert, no distress and comfortable SKIN: skin color, texture, turgor are normal, no rashes or significant lesions EYES: normal, Conjunctiva are pink and non-injected, sclera clear  NECK: supple, thyroid normal size, non-tender, without nodularity LYMPH:  no palpable lymphadenopathy in the cervical, axillary  LUNGS: clear to auscultation and percussion  with normal breathing effort HEART: regular rate & rhythm and no murmurs and no lower extremity edema ABDOMEN:abdomen soft, non-tender and normal bowel sounds Musculoskeletal:no cyanosis of digits and no clubbing  NEURO: alert & oriented x 3 with fluent speech, no focal motor/sensory deficits  LABORATORY DATA:  I have reviewed the data as listed CBC Latest Ref Rng & Units 05/05/2020 01/03/2020 10/03/2019  WBC 4.0 - 10.5 K/uL 5.8 4.4 5.0  Hemoglobin 13.0 - 17.0 g/dL 13.6 12.6(L) 14.0  Hematocrit 39.0 - 52.0 % 38.0(L) 36.2(L) 40.5  Platelets 150 - 400 K/uL 260 222 256     CMP Latest Ref Rng & Units 05/05/2020 01/03/2020 10/03/2019  Glucose 70 - 99 mg/dL 66(L) 76 92  BUN 6 - 20 mg/dL 11 16 10   Creatinine 0.61 - 1.24 mg/dL 1.30(H) 1.26(H) 1.19  Sodium 135 - 145 mmol/L 140 141 140  Potassium 3.5 - 5.1 mmol/L 4.4 4.3 4.4  Chloride 98 - 111 mmol/L 106 105 106  CO2 22 - 32 mmol/L 29 29 29   Calcium 8.9 - 10.3 mg/dL 9.2 9.4 9.7  Total Protein 6.5 - 8.1 g/dL 6.9 6.4(L) 6.8  Total Bilirubin 0.3 - 1.2 mg/dL 0.7 0.6 0.7  Alkaline Phos 38 - 126 U/L 68 70 63  AST 15 - 41 U/L 32 33 29  ALT 0 - 44 U/L 23 23 20       RADIOGRAPHIC STUDIES: I have personally reviewed the radiological images as listed and agreed with the findings in the report. No results found.   ASSESSMENT & PLAN:  Joe Morris is a 40 y.o. male with    1. GIST of small intestine, pT1NxM0, stage I, low grade, with perforation -He is status post laparoscopic surgery on 07/15/17.  -He has been onadjuvantGleevec since 09/2017. He is tolerating moderately well.He has nausea from this if taken in the AM so he takes in the PM. However, in the PM he has had severe gas and mild diarrhea. He would like to stop now  -I discussed switching back to AM dose and reducing to 300mg  daily for 5 more months. We discussed the stand adjuvant therapy is 3 years, which has showed lower risk of recurrence compared to 2 years of imatinib. After a  lengthy discussion, pt would like to stop now.  -I discussed proceeding with 5 year Surveillance plan. His next CT AP is on 05/08/20. Will scan every 1.5 years for 5 years. F/u every 4 months with lab and exam.  -Labs reviewed today. Physical exam unremarkable.  -he is scheduled for surveillance CT scan later this week.  I will call him with result  -Follow-up in 4 months with lab.   2.History of acute Renal Failureafter surgery. Required 2 weeks of Dialysis. Now resolved.   PLAN: -He is clinically doing very well -Given poor toleration, will stop Gleevec per pt's request  -CT CAP on 05/08/20, will call him with results  -lab and Follow-up in 4 months   No problem-specific Assessment & Plan notes found for this encounter.   No orders of the defined types were placed in this encounter.  All questions were answered. The patient  knows to call the clinic with any problems, questions or concerns. No barriers to learning was detected. The total time spent in the appointment was 30 minutes.     [name], MD 05/05/2020   I, Joslyn Devon, am acting as scribe for [name], MD.   I have reviewed the above documentation for accuracy and completeness, and I agree with the above.

## 2020-05-05 ENCOUNTER — Encounter: Payer: Self-pay | Admitting: Hematology

## 2020-05-05 ENCOUNTER — Other Ambulatory Visit: Payer: Self-pay

## 2020-05-05 ENCOUNTER — Inpatient Hospital Stay: Payer: 59

## 2020-05-05 ENCOUNTER — Inpatient Hospital Stay: Payer: 59 | Attending: Hematology | Admitting: Hematology

## 2020-05-05 VITALS — BP 123/72 | HR 60 | Temp 97.5°F | Resp 13 | Ht 67.0 in | Wt 180.5 lb

## 2020-05-05 DIAGNOSIS — C49A3 Gastrointestinal stromal tumor of small intestine: Secondary | ICD-10-CM | POA: Insufficient documentation

## 2020-05-05 DIAGNOSIS — R197 Diarrhea, unspecified: Secondary | ICD-10-CM | POA: Insufficient documentation

## 2020-05-05 DIAGNOSIS — R110 Nausea: Secondary | ICD-10-CM | POA: Diagnosis not present

## 2020-05-05 DIAGNOSIS — C49A0 Gastrointestinal stromal tumor, unspecified site: Secondary | ICD-10-CM

## 2020-05-05 DIAGNOSIS — Z79899 Other long term (current) drug therapy: Secondary | ICD-10-CM | POA: Insufficient documentation

## 2020-05-05 LAB — CBC WITH DIFFERENTIAL (CANCER CENTER ONLY)
Abs Immature Granulocytes: 0.02 10*3/uL (ref 0.00–0.07)
Basophils Absolute: 0.1 10*3/uL (ref 0.0–0.1)
Basophils Relative: 1 %
Eosinophils Absolute: 0.2 10*3/uL (ref 0.0–0.5)
Eosinophils Relative: 3 %
HCT: 38 % — ABNORMAL LOW (ref 39.0–52.0)
Hemoglobin: 13.6 g/dL (ref 13.0–17.0)
Immature Granulocytes: 0 %
Lymphocytes Relative: 25 %
Lymphs Abs: 1.5 10*3/uL (ref 0.7–4.0)
MCH: 35.3 pg — ABNORMAL HIGH (ref 26.0–34.0)
MCHC: 35.8 g/dL (ref 30.0–36.0)
MCV: 98.7 fL (ref 80.0–100.0)
Monocytes Absolute: 0.5 10*3/uL (ref 0.1–1.0)
Monocytes Relative: 9 %
Neutro Abs: 3.6 10*3/uL (ref 1.7–7.7)
Neutrophils Relative %: 62 %
Platelet Count: 260 10*3/uL (ref 150–400)
RBC: 3.85 MIL/uL — ABNORMAL LOW (ref 4.22–5.81)
RDW: 13 % (ref 11.5–15.5)
WBC Count: 5.8 10*3/uL (ref 4.0–10.5)
nRBC: 0 % (ref 0.0–0.2)

## 2020-05-05 LAB — CMP (CANCER CENTER ONLY)
ALT: 23 U/L (ref 0–44)
AST: 32 U/L (ref 15–41)
Albumin: 4.5 g/dL (ref 3.5–5.0)
Alkaline Phosphatase: 68 U/L (ref 38–126)
Anion gap: 5 (ref 5–15)
BUN: 11 mg/dL (ref 6–20)
CO2: 29 mmol/L (ref 22–32)
Calcium: 9.2 mg/dL (ref 8.9–10.3)
Chloride: 106 mmol/L (ref 98–111)
Creatinine: 1.3 mg/dL — ABNORMAL HIGH (ref 0.61–1.24)
GFR, Estimated: >60 mL/min (ref >60)
Glucose, Bld: 66 mg/dL — ABNORMAL LOW (ref 70–99)
Potassium: 4.4 mmol/L (ref 3.5–5.1)
Sodium: 140 mmol/L (ref 135–145)
Total Bilirubin: 0.7 mg/dL (ref 0.3–1.2)
Total Protein: 6.9 g/dL (ref 6.5–8.1)

## 2020-05-08 ENCOUNTER — Ambulatory Visit
Admission: RE | Admit: 2020-05-08 | Discharge: 2020-05-08 | Disposition: A | Discharge status: Home / Self Care | Payer: 59 | Source: Ambulatory Visit | Attending: Hematology | Admitting: Hematology

## 2020-05-08 ENCOUNTER — Other Ambulatory Visit: Payer: Self-pay

## 2020-05-08 DIAGNOSIS — C49A3 Gastrointestinal stromal tumor of small intestine: Secondary | ICD-10-CM | POA: Insufficient documentation

## 2020-05-08 DIAGNOSIS — Z8509 Personal history of malignant neoplasm of other digestive organs: Secondary | ICD-10-CM | POA: Diagnosis not present

## 2020-05-08 MED ORDER — IOHEXOL 300 MG/ML  SOLN
100.0000 mL | Freq: Once | INTRAMUSCULAR | Status: AC | PRN
  Administered 2020-05-08: 100 mL via INTRAVENOUS

## 2020-05-10 ENCOUNTER — Encounter: Payer: Self-pay | Admitting: Hematology

## 2020-05-27 ENCOUNTER — Other Ambulatory Visit (HOSPITAL_BASED_OUTPATIENT_CLINIC_OR_DEPARTMENT_OTHER): Payer: Self-pay

## 2020-07-01 ENCOUNTER — Other Ambulatory Visit: Payer: Self-pay

## 2020-07-01 MED ORDER — PANTOPRAZOLE SODIUM 40 MG PO TBEC
DELAYED_RELEASE_TABLET | ORAL | 1 refills | Status: DC
  Filled 2020-07-01: qty 90, 90d supply, fill #0

## 2020-08-26 ENCOUNTER — Telehealth: Payer: Self-pay | Admitting: Hematology

## 2020-08-26 NOTE — Telephone Encounter (Signed)
Left message with rescheduled upcoming appointment due to provider's PAL. 

## 2020-09-10 ENCOUNTER — Ambulatory Visit: Payer: 59 | Admitting: Hematology

## 2020-09-10 ENCOUNTER — Other Ambulatory Visit: Payer: 59

## 2020-09-19 ENCOUNTER — Ambulatory Visit: Payer: 59 | Admitting: Hematology

## 2020-09-19 ENCOUNTER — Other Ambulatory Visit: Payer: 59

## 2020-09-23 ENCOUNTER — Ambulatory Visit: Payer: 59 | Admitting: Hematology

## 2020-09-23 ENCOUNTER — Inpatient Hospital Stay: Payer: 59 | Attending: Physician Assistant

## 2020-10-08 ENCOUNTER — Other Ambulatory Visit: Payer: Self-pay

## 2020-10-08 MED ORDER — PANTOPRAZOLE SODIUM 40 MG PO TBEC
DELAYED_RELEASE_TABLET | ORAL | 1 refills | Status: DC
  Filled 2020-10-08: qty 90, 90d supply, fill #0

## 2020-10-09 ENCOUNTER — Inpatient Hospital Stay: Payer: 59

## 2020-10-09 ENCOUNTER — Encounter: Payer: Self-pay | Admitting: Hematology

## 2020-10-09 ENCOUNTER — Other Ambulatory Visit: Payer: Self-pay

## 2020-10-09 ENCOUNTER — Inpatient Hospital Stay: Payer: 59 | Attending: Hematology | Admitting: Hematology

## 2020-10-09 VITALS — BP 119/76 | HR 60 | Temp 98.2°F | Resp 18 | Ht 67.0 in | Wt 179.9 lb

## 2020-10-09 DIAGNOSIS — C49A0 Gastrointestinal stromal tumor, unspecified site: Secondary | ICD-10-CM

## 2020-10-09 DIAGNOSIS — C49A3 Gastrointestinal stromal tumor of small intestine: Secondary | ICD-10-CM | POA: Diagnosis not present

## 2020-10-09 LAB — CMP (CANCER CENTER ONLY)
ALT: 19 U/L (ref 0–44)
AST: 23 U/L (ref 15–41)
Albumin: 4.3 g/dL (ref 3.5–5.0)
Alkaline Phosphatase: 60 U/L (ref 38–126)
Anion gap: 9 (ref 5–15)
BUN: 15 mg/dL (ref 6–20)
CO2: 29 mmol/L (ref 22–32)
Calcium: 9.9 mg/dL (ref 8.9–10.3)
Chloride: 104 mmol/L (ref 98–111)
Creatinine: 1.2 mg/dL (ref 0.61–1.24)
GFR, Estimated: >60 mL/min (ref >60)
Glucose, Bld: 80 mg/dL (ref 70–99)
Potassium: 4.6 mmol/L (ref 3.5–5.1)
Sodium: 142 mmol/L (ref 135–145)
Total Bilirubin: 0.7 mg/dL (ref 0.3–1.2)
Total Protein: 7.1 g/dL (ref 6.5–8.1)

## 2020-10-09 LAB — CBC WITH DIFFERENTIAL (CANCER CENTER ONLY)
Abs Immature Granulocytes: 0.03 10*3/uL (ref 0.00–0.07)
Basophils Absolute: 0.1 10*3/uL (ref 0.0–0.1)
Basophils Relative: 1 %
Eosinophils Absolute: 0.1 10*3/uL (ref 0.0–0.5)
Eosinophils Relative: 1 %
HCT: 42.5 % (ref 39.0–52.0)
Hemoglobin: 15 g/dL (ref 13.0–17.0)
Immature Granulocytes: 0 %
Lymphocytes Relative: 19 %
Lymphs Abs: 1.4 10*3/uL (ref 0.7–4.0)
MCH: 32.9 pg (ref 26.0–34.0)
MCHC: 35.3 g/dL (ref 30.0–36.0)
MCV: 93.2 fL (ref 80.0–100.0)
Monocytes Absolute: 0.6 10*3/uL (ref 0.1–1.0)
Monocytes Relative: 9 %
Neutro Abs: 4.9 10*3/uL (ref 1.7–7.7)
Neutrophils Relative %: 70 %
Platelet Count: 283 10*3/uL (ref 150–400)
RBC: 4.56 MIL/uL (ref 4.22–5.81)
RDW: 12.5 % (ref 11.5–15.5)
WBC Count: 7 10*3/uL (ref 4.0–10.5)
nRBC: 0 % (ref 0.0–0.2)

## 2020-10-09 NOTE — Progress Notes (Signed)
[place]   Telephone:(336) [phone] Fax:(336) [phone]   Clinic Follow up Note   Patient Care Team: Lennie Odor, [place] as PCP - General (Nurse Practitioner) Elmarie Shiley, MD as Consulting Physician (Nephrology) Greer Pickerel, MD as Consulting Physician (General Surgery) [name], MD as Consulting Physician (Hematology)  Date of Service:  10/09/2020  CHIEF COMPLAINT: f/u of GIST of small intestine  PREVIOUS THERAPY:  imatinib (Gleevec) '400mg'$  daily started in late July 2019. Stopped 05/05/20 due to GI side effects.  CURRENT THERAPY: Surveillance    INTERVAL HISTORY:  Joe Morris is here for a follow up of GIST. He was last seen by me on 05/05/20. He presents to the clinic alone. He reports he is back to normal since stopping the Gleevec.   All other systems were reviewed with the patient and are negative.  MEDICAL HISTORY:  Past Medical History:  Diagnosis Date   Anemia 07/2014   Gastrointestinal hemorrhage with melena    /notes 07/26/2014 [facility] [place] [facility])   GERD (gastroesophageal reflux disease)    gist dx'd 07/2017   Hypertension    Intra-abdominal abscess ([place])    [name] 07/13/2017    SURGICAL HISTORY: Past Surgical History:  Procedure Laterality Date   COLONOSCOPY WITH ESOPHAGOGASTRODUODENOSCOPY (EGD)  07/2014   "@ [facility]"   IR FLUORO GUIDE CV LINE RIGHT  07/25/2017   IR US GUIDE VASC ACCESS RIGHT  07/25/2017   LAPAROSCOPY N/A 07/15/2017   Procedure: LAPAROSCOPY DIAGNOSTIC;  Surgeon: Greer Pickerel, MD;  Location: [place];  Service: General;  Laterality: N/A;   LAPAROTOMY N/A 07/15/2017   Procedure: EXPLORATORY LAPAROTOMY PROXIMAL SMALL BOWEL RESECTION, DRAINAGE OF ABDOMINAL ABSCESS;  Surgeon: Greer Pickerel, MD;  Location: [place];  Service: General;  Laterality: N/A;    I have reviewed the social history and family history with the patient and they are unchanged from previous note.  ALLERGIES:  is allergic to vancomycin and levaquin [levofloxacin].  MEDICATIONS:   Current Outpatient Medications  Medication Sig Dispense Refill   Multiple Vitamin (MULTIVITAMIN WITH MINERALS) TABS tablet Take 1 tablet by mouth daily.     pantoprazole (PROTONIX) 40 MG tablet Take 40 mg by mouth once.     pantoprazole (PROTONIX) 40 MG tablet 1 tablet Orally Once a day 90 days 90 tablet 1   pantoprazole (PROTONIX) 40 MG tablet 1 tablet Orally Once a day 90 days 90 tablet 1   No current facility-administered medications for this visit.    PHYSICAL EXAMINATION: ECOG PERFORMANCE STATUS: 0 - Asymptomatic  Vitals:   10/09/20 1010  BP: 119/76  Pulse: 60  Resp: 18  Temp: 98.2 F (36.8 C)  SpO2: 100%   Wt Readings from Last 3 Encounters:  10/09/20 179 lb 14.4 oz (81.6 kg)  05/05/20 180 lb 8 oz (81.9 kg)  01/03/20 176 lb 1.6 oz (79.9 kg)     GENERAL:alert, no distress and comfortable SKIN: skin color, texture, turgor are normal, no rashes or significant lesions EYES: normal, Conjunctiva are pink and non-injected, sclera clear NECK: supple, thyroid normal size, non-tender, without nodularity LYMPH:  no palpable lymphadenopathy in the cervical, axillary  LUNGS: clear to auscultation and percussion with normal breathing effort HEART: regular rate & rhythm and no murmurs and no lower extremity edema ABDOMEN:abdomen soft, non-tender and normal bowel sounds Musculoskeletal:no cyanosis of digits and no clubbing  NEURO: alert & oriented x 3 with fluent speech, no focal motor/sensory deficits  LABORATORY DATA:  I have reviewed the data as listed CBC Latest  Ref Rng & Units 10/09/2020 05/05/2020 01/03/2020  WBC 4.0 - 10.5 K/uL 7.0 5.8 4.4  Hemoglobin 13.0 - 17.0 g/dL 15.0 13.6 12.6(L)  Hematocrit 39.0 - 52.0 % 42.5 38.0(L) 36.2(L)  Platelets 150 - 400 K/uL 283 260 222     CMP Latest Ref Rng & Units 10/09/2020 05/05/2020 01/03/2020  Glucose 70 - 99 mg/dL 80 66(L) 76  BUN 6 - 20 mg/dL '15 11 16  '$ Creatinine 0.61 - 1.24 mg/dL 1.20 1.30(H) 1.26(H)  Sodium 135 - 145 mmol/L 142  140 141  Potassium 3.5 - 5.1 mmol/L 4.6 4.4 4.3  Chloride 98 - 111 mmol/L 104 106 105  CO2 22 - 32 mmol/L '29 29 29  '$ Calcium 8.9 - 10.3 mg/dL 9.9 9.2 9.4  Total Protein 6.5 - 8.1 g/dL 7.1 6.9 6.4(L)  Total Bilirubin 0.3 - 1.2 mg/dL 0.7 0.7 0.6  Alkaline Phos 38 - 126 U/L 60 68 70  AST 15 - 41 U/L 23 32 33  ALT 0 - 44 U/L '19 23 23      '$ RADIOGRAPHIC STUDIES: I have personally reviewed the radiological images as listed and agreed with the findings in the report. No results found.   ASSESSMENT & PLAN:  Joe Morris is a 40 y.o. male with   1. GIST of small intestine, pT1NxM0, stage I, low grade, with perforation  -He is status post laparoscopic surgery on 07/15/17. -He started adjuvant Gleevec 09/2017. He stopped on 05/05/20 due to intolerable GI side effects. He has recovered well since stopping. -He is now under observation. We will plan for surveillance scans once a year due to his high copay (he notes 05/2020 scan was $800 out of pocket). He will contact us with any new symptoms, and we can move up the scan. We discussed what to look out for and when to contact us. -He will continue to see his PCP once a year, next scheduled for later this month. I recommended he space out his appointments with [place] and his PCP so he is seen about every 6 months. -He will return in 05/2021 with CT scan prior to visit.   2. History of acute Renal Failure after surgery. Required 2 weeks of Dialysis. Now resolved.      PLAN:  -lab and f/u in 05/2021 with CT AP prior to visit   No problem-specific Assessment & Plan notes found for this encounter.   Orders Placed This Encounter  Procedures   CT Abdomen Pelvis W Contrast    Standing Status:   Future    Standing Expiration Date:   10/09/2021    Order Specific Question:   If indicated for the ordered procedure, I authorize the administration of contrast media per Radiology protocol    Answer:   Yes    Order Specific Question:   Preferred imaging location?     Answer:   [facility]    Order Specific Question:   Release to patient    Answer:   Immediate    Order Specific Question:   Is Oral Contrast requested for this exam?    Answer:   Yes, Per Radiology protocol   All questions were answered. The patient knows to call the clinic with any problems, questions or concerns. No barriers to learning was detected. The total time spent in the appointment was 20 minutes.     [name], MD 10/09/2020   I, Wilburn Mylar, am acting as scribe for [name], MD.   I have reviewed the  above documentation for accuracy and completeness, and I agree with the above.     

## 2020-12-19 ENCOUNTER — Ambulatory Visit: Payer: 59 | Attending: Internal Medicine

## 2020-12-19 ENCOUNTER — Other Ambulatory Visit: Payer: Self-pay

## 2020-12-19 DIAGNOSIS — Z23 Encounter for immunization: Secondary | ICD-10-CM

## 2020-12-19 MED ORDER — PFIZER COVID-19 VAC BIVALENT 30 MCG/0.3ML IM SUSP
INTRAMUSCULAR | 0 refills | Status: AC
  Filled 2020-12-19: qty 0.3, 1d supply, fill #0

## 2020-12-19 NOTE — Progress Notes (Signed)
   Covid-19 Vaccination Clinic  Name:  Joe Morris    MRN: [id number] DOB: 1980/09/10  12/19/2020  [name] was observed post Covid-19 immunization for 15 minutes without incident. He was provided with Vaccine Information Sheet and instruction to access the V-Safe system.   [name] was instructed to call 911 with any severe reactions post vaccine: Difficulty breathing  Swelling of face and throat  A fast heartbeat  A bad rash all over body  Dizziness and weakness   Lu Duffel, PharmD, MBA Clinical Acute Care Pharmacist

## 2021-01-15 ENCOUNTER — Other Ambulatory Visit: Payer: Self-pay

## 2021-01-15 MED ORDER — PANTOPRAZOLE SODIUM 40 MG PO TBEC
DELAYED_RELEASE_TABLET | ORAL | 0 refills | Status: DC
  Filled 2021-01-15: qty 90, 90d supply, fill #0

## 2021-01-15 MED ORDER — BUPROPION HCL ER (XL) 150 MG PO TB24
ORAL_TABLET | ORAL | 0 refills | Status: AC
  Filled 2021-01-15: qty 90, 90d supply, fill #0

## 2021-04-20 ENCOUNTER — Other Ambulatory Visit: Payer: Self-pay

## 2021-04-20 ENCOUNTER — Ambulatory Visit
Admission: RE | Admit: 2021-04-20 | Discharge: 2021-04-20 | Disposition: A | Discharge status: Home / Self Care | Payer: No Typology Code available for payment source | Source: Ambulatory Visit | Attending: Hematology | Admitting: Hematology

## 2021-04-20 DIAGNOSIS — C49A3 Gastrointestinal stromal tumor of small intestine: Secondary | ICD-10-CM | POA: Diagnosis present

## 2021-04-20 MED ORDER — IOHEXOL 300 MG/ML  SOLN
100.0000 mL | Freq: Once | INTRAMUSCULAR | Status: AC | PRN
Start: 2021-04-20 — End: 2021-04-20
  Administered 2021-04-20: 100 mL via INTRAVENOUS

## 2021-04-21 ENCOUNTER — Other Ambulatory Visit: Payer: Self-pay

## 2021-04-21 MED ORDER — PANTOPRAZOLE SODIUM 40 MG PO TBEC
DELAYED_RELEASE_TABLET | ORAL | 0 refills | Status: DC
  Filled 2021-04-21: qty 90, 90d supply, fill #0

## 2021-04-23 ENCOUNTER — Telehealth: Payer: Self-pay | Admitting: Hematology

## 2021-04-23 NOTE — Telephone Encounter (Signed)
Sch per 2/16 inbasket,left msg

## 2021-04-27 ENCOUNTER — Inpatient Hospital Stay (HOSPITAL_BASED_OUTPATIENT_CLINIC_OR_DEPARTMENT_OTHER): Payer: No Typology Code available for payment source | Admitting: Hematology

## 2021-04-27 ENCOUNTER — Inpatient Hospital Stay: Payer: No Typology Code available for payment source | Attending: Hematology

## 2021-04-27 ENCOUNTER — Other Ambulatory Visit: Payer: Self-pay

## 2021-04-27 ENCOUNTER — Encounter: Payer: Self-pay | Admitting: Hematology

## 2021-04-27 VITALS — BP 137/85 | HR 71 | Temp 99.2°F | Resp 16 | Ht 67.0 in | Wt 185.9 lb

## 2021-04-27 DIAGNOSIS — C49A3 Gastrointestinal stromal tumor of small intestine: Secondary | ICD-10-CM | POA: Diagnosis not present

## 2021-04-27 DIAGNOSIS — Z79899 Other long term (current) drug therapy: Secondary | ICD-10-CM | POA: Insufficient documentation

## 2021-04-27 DIAGNOSIS — K219 Gastro-esophageal reflux disease without esophagitis: Secondary | ICD-10-CM | POA: Diagnosis not present

## 2021-04-27 DIAGNOSIS — I10 Essential (primary) hypertension: Secondary | ICD-10-CM | POA: Diagnosis not present

## 2021-04-27 DIAGNOSIS — C49A0 Gastrointestinal stromal tumor, unspecified site: Secondary | ICD-10-CM

## 2021-04-27 LAB — CMP (CANCER CENTER ONLY)
ALT: 20 U/L (ref 0–44)
AST: 28 U/L (ref 15–41)
Albumin: 4.6 g/dL (ref 3.5–5.0)
Alkaline Phosphatase: 72 U/L (ref 38–126)
Anion gap: 5 (ref 5–15)
BUN: 16 mg/dL (ref 6–20)
CO2: 30 mmol/L (ref 22–32)
Calcium: 9.3 mg/dL (ref 8.9–10.3)
Chloride: 104 mmol/L (ref 98–111)
Creatinine: 1.28 mg/dL — ABNORMAL HIGH (ref 0.61–1.24)
GFR, Estimated: >60 mL/min (ref >60)
Glucose, Bld: 85 mg/dL (ref 70–99)
Potassium: 3.8 mmol/L (ref 3.5–5.1)
Sodium: 139 mmol/L (ref 135–145)
Total Bilirubin: 0.4 mg/dL (ref 0.3–1.2)
Total Protein: 6.9 g/dL (ref 6.5–8.1)

## 2021-04-27 LAB — CBC WITH DIFFERENTIAL (CANCER CENTER ONLY)
Abs Immature Granulocytes: 0.03 10*3/uL (ref 0.00–0.07)
Basophils Absolute: 0.1 10*3/uL (ref 0.0–0.1)
Basophils Relative: 1 %
Eosinophils Absolute: 0.1 10*3/uL (ref 0.0–0.5)
Eosinophils Relative: 2 %
HCT: 39.5 % (ref 39.0–52.0)
Hemoglobin: 14 g/dL (ref 13.0–17.0)
Immature Granulocytes: 0 %
Lymphocytes Relative: 26 %
Lymphs Abs: 1.7 10*3/uL (ref 0.7–4.0)
MCH: 32.6 pg (ref 26.0–34.0)
MCHC: 35.4 g/dL (ref 30.0–36.0)
MCV: 91.9 fL (ref 80.0–100.0)
Monocytes Absolute: 0.6 10*3/uL (ref 0.1–1.0)
Monocytes Relative: 9 %
Neutro Abs: 4.3 10*3/uL (ref 1.7–7.7)
Neutrophils Relative %: 62 %
Platelet Count: 298 10*3/uL (ref 150–400)
RBC: 4.3 MIL/uL (ref 4.22–5.81)
RDW: 13 % (ref 11.5–15.5)
WBC Count: 6.8 10*3/uL (ref 4.0–10.5)
nRBC: 0 % (ref 0.0–0.2)

## 2021-04-27 NOTE — Progress Notes (Signed)
[place]   Telephone:(336) [phone] Fax:(336) [phone]   Clinic Follow up Note   Patient Care Team: Lennie Odor, [place] as PCP - General (Nurse Practitioner) Elmarie Shiley, MD as Consulting Physician (Nephrology) Greer Pickerel, MD as Consulting Physician (General Surgery) [name], MD as Consulting Physician (Hematology)  Date of Service:  04/27/2021  CHIEF COMPLAINT: f/u of GIST of small intestine  CURRENT THERAPY:  Surveillance  ASSESSMENT & PLAN:  Joe Morris is a 41 y.o. male with   1. GIST of small intestine, pT1NxM0, stage I, low grade, with perforation  -He is s/p laparoscopic surgery on 07/15/17 by Dr. Redmond Pulling. Pathology showed 1.7 cm GIST of proximal jejunum. -He started adjuvant Gleevec 09/2017. He stopped on 05/05/20 due to intolerable GI side effects. He has recovered well since stopping. He is now under surveillance  -surveillance CT AP on 04/20/21 showed some wall thickening along anastomotic staple line in area of prior tumor. I reviewed the results with him today.  -he is clinically doing well except worsening GERD symptoms  -I will reach out to GI to discuss his case and referral for endoscopy. He has not been seen by GI here in [place]. Will review his CT in our GI conference  -I will see him back in 3 months, sooner if needed   2. New GERD -he is on protonix 40mg  daily but symptoms not controlled  -will refer him to GI   2. 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 -he has been on insurance through his job with [employer], but he is looking into coverage through the [facility] because of his prior exposure.   PLAN:  -lab and f/u in 3 months -will refer him to [place] for endoscopy -will review his recent CT scan in out tumor board next week    No problem-specific Assessment & Plan notes found for this encounter.   INTERVAL HISTORY:  Joe Morris is here for a follow up of GIST. He was last seen by me on 10/09/20. He presents to the  clinic alone. He came here after finishing work. He reports he is doing well overall. He denies any bowel concerns. He does note several days of intermittent abdominal pain that resolved on its own without intervention.   All other systems were reviewed with the patient and are negative.  MEDICAL HISTORY:  Past Medical History:  Diagnosis Date   Anemia 07/2014   Gastrointestinal hemorrhage with melena    /notes 07/26/2014 [facility])   GERD (gastroesophageal reflux disease)    gist dx'd 07/2017   Hypertension    Intra-abdominal abscess ([place])    [name] 07/13/2017    SURGICAL HISTORY: Past Surgical History:  Procedure Laterality Date   COLONOSCOPY WITH ESOPHAGOGASTRODUODENOSCOPY (EGD)  07/2014   "@ [facility]"   IR FLUORO GUIDE CV LINE RIGHT  07/25/2017   IR US GUIDE VASC ACCESS RIGHT  07/25/2017   LAPAROSCOPY N/A 07/15/2017   Procedure: LAPAROSCOPY DIAGNOSTIC;  Surgeon: Greer Pickerel, MD;  Location: [place];  Service: General;  Laterality: N/A;   LAPAROTOMY N/A 07/15/2017   Procedure: EXPLORATORY LAPAROTOMY PROXIMAL SMALL BOWEL RESECTION, DRAINAGE OF ABDOMINAL ABSCESS;  Surgeon: Greer Pickerel, MD;  Location: [place];  Service: General;  Laterality: N/A;    I have reviewed the social history and family history with the patient and they are unchanged from previous note.  ALLERGIES:  is allergic to vancomycin and levaquin [levofloxacin].  MEDICATIONS:  Current Outpatient Medications  Medication Sig  Dispense Refill   buPROPion (WELLBUTRIN XL) 150 MG 24 hr tablet 1 tablet in the morning Orally Once a day 90 days 90 tablet 0   COVID-19 mRNA bivalent vaccine, Pfizer, (PFIZER COVID-19 VAC BIVALENT) injection Inject into the muscle. 0.3 mL 0   Multiple Vitamin (MULTIVITAMIN WITH MINERALS) TABS tablet Take 1 tablet by mouth daily.     pantoprazole (PROTONIX) 40 MG tablet Take 40 mg by mouth once.     pantoprazole (PROTONIX) 40 MG tablet 1 tablet Orally Once a day 90 days 90 tablet 1   pantoprazole (PROTONIX)  40 MG tablet 1 tablet Orally Once a day 90 days 90 tablet 1   pantoprazole (PROTONIX) 40 MG tablet 1 tablet Orally Once a day 90 days 90 tablet 0   pantoprazole (PROTONIX) 40 MG tablet 1 tablet Orally Once a day 90 days 90 tablet 0   No current facility-administered medications for this visit.    PHYSICAL EXAMINATION: ECOG PERFORMANCE STATUS: 1 - Symptomatic but completely ambulatory  Vitals:   04/27/21 1520  BP: 137/85  Pulse: 71  Resp: 16  Temp: 99.2 F (37.3 C)  SpO2: 100%   Wt Readings from Last 3 Encounters:  04/27/21 185 lb 14.4 oz (84.3 kg)  10/09/20 179 lb 14.4 oz (81.6 kg)  05/05/20 180 lb 8 oz (81.9 kg)    GENERAL:alert, no distress and comfortable SKIN: skin color normal, no rashes or significant lesions EYES: normal, Conjunctiva are pink and non-injected, sclera clear  NEURO: alert & oriented x 3 with fluent speech  LABORATORY DATA:  I have reviewed the data as listed CBC Latest Ref Rng & Units 04/27/2021 10/09/2020 05/05/2020  WBC 4.0 - 10.5 K/uL 6.8 7.0 5.8  Hemoglobin 13.0 - 17.0 g/dL 14.0 15.0 13.6  Hematocrit 39.0 - 52.0 % 39.5 42.5 38.0(L)  Platelets 150 - 400 K/uL 298 283 260     CMP Latest Ref Rng & Units 04/27/2021 10/09/2020 05/05/2020  Glucose 70 - 99 mg/dL 85 80 66(L)  BUN 6 - 20 mg/dL 16 15 11   Creatinine 0.61 - 1.24 mg/dL 1.28(H) 1.20 1.30(H)  Sodium 135 - 145 mmol/L 139 142 140  Potassium 3.5 - 5.1 mmol/L 3.8 4.6 4.4  Chloride 98 - 111 mmol/L 104 104 106  CO2 22 - 32 mmol/L 30 29 29   Calcium 8.9 - 10.3 mg/dL 9.3 9.9 9.2  Total Protein 6.5 - 8.1 g/dL 6.9 7.1 6.9  Total Bilirubin 0.3 - 1.2 mg/dL 0.4 0.7 0.7  Alkaline Phos 38 - 126 U/L 72 60 68  AST 15 - 41 U/L 28 23 32  ALT 0 - 44 U/L 20 19 23       RADIOGRAPHIC STUDIES: I have personally reviewed the radiological images as listed and agreed with the findings in the report. No results found.    Orders Placed This Encounter  Procedures   Ambulatory referral to Gastroenterology     Referral Priority:   Routine    Referral Type:   Consultation    Referral Reason:   Specialty Services Required    Number of Visits Requested:   1   All questions were answered. The patient knows to call the clinic with any problems, questions or concerns. No barriers to learning was detected. The total time spent in the appointment was 30 minutes.     [name], MD 04/27/2021   I, Wilburn Mylar, am acting as scribe for [name], MD.   I have reviewed the above documentation for accuracy and  completeness, and I agree with the above. °  ° ° °

## 2021-05-01 ENCOUNTER — Ambulatory Visit: Payer: No Typology Code available for payment source | Admitting: Hematology

## 2021-05-01 ENCOUNTER — Other Ambulatory Visit: Payer: No Typology Code available for payment source

## 2021-05-06 ENCOUNTER — Telehealth: Payer: Self-pay | Admitting: Hematology

## 2021-05-06 ENCOUNTER — Telehealth: Payer: Self-pay

## 2021-05-06 ENCOUNTER — Other Ambulatory Visit: Payer: Self-pay

## 2021-05-06 NOTE — Telephone Encounter (Signed)
Mansouraty, Telford Nab., MD  [name], MD; Milus Banister, MD; Timothy Lasso, RN ?YF, we will try.  ? ?Jeron Grahn,  ?Please place on my hospital week block at the end of this month on Monday or Wednesday or Thursday.  Enteroscopy 45 minute case.  Thanks.  ? ?GM   ?  ?   ?Previous Messages ?  ?----- Message -----  ?From: [name], MD  ?Sent: 05/06/2021   2:47 PM EST  ?To: Milus Banister, MD, Timothy Lasso, RN, *  ? ?Thanks much Linna Hoff, appreciate it.  ? ?Chester Holstein, do you have any open slot to do push enteroscopy earlier than April 20?   ?

## 2021-05-06 NOTE — Telephone Encounter (Signed)
-----   Message from Milus Banister, MD sent at 05/06/2021 11:44 AM EST ----- ?Krista Blue, ?I am happy to help him as long as he is aware that my first opening for this type of thing would be April 20.   ? ?Antwaine Boomhower, can you please contact him and offer him "push enteroscopy" for personal history of GIST tumor and recent abnormal CT scan.  April 20 looks to be my first opening. ? 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 ? ? ? ?----- Message ----- ?From: [name], MD ?Sent: 05/06/2021  11:03 AM EST ?To: Milus Banister, MD ? ?Linna Hoff, ? ?This is the guy we reviewed in conference this morning, history of jejunal GIST s/p resection in 2019, now has small bowel wall thickening at surgical site.  He has EGD at [facility] before, but he prefer to have EGD in your office now (he is a PA at [employer]), could you get him in for push EGD? ? ?THANKS  ? ?Krista Blue  ? ? ?

## 2021-05-06 NOTE — Telephone Encounter (Signed)
Case reviewed in GI tumor board this morning, recommend pushing EGD/EUS to review the thickened proximal jejunal wall, to rule out recurrence. I called pt and he agrees, he would prefer to get it done in [facility], referral made to Dr. Ardis Hughs who reviewed his case in conference this morning.  ? ?[name]  ?05/06/2021  ?

## 2021-05-06 NOTE — Progress Notes (Signed)
The proposed treatment discussed in conference is for discussion purpose only and is not a binding recommendation.  The patients have not been physically examined, or presented with their treatment options.  Therefore, final treatment plans cannot be decided.  

## 2021-05-06 NOTE — Telephone Encounter (Signed)
Next available is actually 4/27 ok to schedule that far out? ?

## 2021-05-07 ENCOUNTER — Other Ambulatory Visit: Payer: Self-pay

## 2021-05-07 DIAGNOSIS — D214 Benign neoplasm of connective and other soft tissue of abdomen: Secondary | ICD-10-CM

## 2021-05-07 DIAGNOSIS — R9389 Abnormal findings on diagnostic imaging of other specified body structures: Secondary | ICD-10-CM

## 2021-05-07 NOTE — Telephone Encounter (Signed)
EGD Enteroscopy has been scheduled for 06/04/21 at [facility] at 730 am with GM  ?

## 2021-05-07 NOTE — Telephone Encounter (Signed)
EGD scheduled, pt instructed and medications reviewed.  Patient instructions mailed to home and sent to My Chart .  Patient to call with any questions or concerns. ? ?

## 2021-05-08 ENCOUNTER — Inpatient Hospital Stay: Payer: No Typology Code available for payment source

## 2021-05-08 ENCOUNTER — Telehealth: Payer: Self-pay

## 2021-05-08 NOTE — Telephone Encounter (Signed)
Spoke with pt via telephone regarding his appt on 05/15/2021.  Dr. Burr Medico sent a staff message requesting that the pt's appt be canceled until after he gets his endoscopy completed at [facility].  Pt stated his endoscopy is scheduled on 06/04/2021.  Informed pt that I will have a follow-up appt w/Dr. Burr Medico scheduled a few days after his endoscopy.  Pt verbalized understanding and was in agreement with canceling the 05/15/2021 appt.  Sent scheduling message to have pt rescheduled for Dr. Burr Medico and lab 2 to 3 days after 06/04/2021. ?

## 2021-05-15 ENCOUNTER — Other Ambulatory Visit: Payer: 59

## 2021-05-18 ENCOUNTER — Ambulatory Visit: Payer: 59 | Admitting: Hematology

## 2021-05-27 ENCOUNTER — Encounter (HOSPITAL_COMMUNITY): Payer: Self-pay | Admitting: Gastroenterology

## 2021-06-04 ENCOUNTER — Ambulatory Visit (HOSPITAL_BASED_OUTPATIENT_CLINIC_OR_DEPARTMENT_OTHER): Payer: No Typology Code available for payment source | Admitting: Anesthesiology

## 2021-06-04 ENCOUNTER — Other Ambulatory Visit: Payer: Self-pay

## 2021-06-04 ENCOUNTER — Encounter (HOSPITAL_COMMUNITY)
Admission: RE | Disposition: A | Discharge status: Home / Self Care | Payer: Self-pay | Source: Home / Self Care | Attending: Gastroenterology

## 2021-06-04 ENCOUNTER — Other Ambulatory Visit (HOSPITAL_COMMUNITY): Payer: Self-pay

## 2021-06-04 ENCOUNTER — Encounter (HOSPITAL_COMMUNITY): Payer: Self-pay | Admitting: Gastroenterology

## 2021-06-04 ENCOUNTER — Ambulatory Visit (HOSPITAL_COMMUNITY)
Admission: RE | Admit: 2021-06-04 | Discharge: 2021-06-04 | Disposition: A | Discharge status: Home / Self Care | Payer: No Typology Code available for payment source | Attending: Gastroenterology | Admitting: Gastroenterology

## 2021-06-04 ENCOUNTER — Ambulatory Visit (HOSPITAL_COMMUNITY): Payer: No Typology Code available for payment source | Admitting: Anesthesiology

## 2021-06-04 DIAGNOSIS — D214 Benign neoplasm of connective and other soft tissue of abdomen: Secondary | ICD-10-CM

## 2021-06-04 DIAGNOSIS — K2289 Other specified disease of esophagus: Secondary | ICD-10-CM

## 2021-06-04 DIAGNOSIS — R933 Abnormal findings on diagnostic imaging of other parts of digestive tract: Secondary | ICD-10-CM | POA: Insufficient documentation

## 2021-06-04 DIAGNOSIS — D649 Anemia, unspecified: Secondary | ICD-10-CM

## 2021-06-04 DIAGNOSIS — I10 Essential (primary) hypertension: Secondary | ICD-10-CM

## 2021-06-04 DIAGNOSIS — K3189 Other diseases of stomach and duodenum: Secondary | ICD-10-CM | POA: Insufficient documentation

## 2021-06-04 DIAGNOSIS — R9389 Abnormal findings on diagnostic imaging of other specified body structures: Secondary | ICD-10-CM

## 2021-06-04 DIAGNOSIS — K219 Gastro-esophageal reflux disease without esophagitis: Secondary | ICD-10-CM

## 2021-06-04 DIAGNOSIS — Z980 Intestinal bypass and anastomosis status: Secondary | ICD-10-CM | POA: Insufficient documentation

## 2021-06-04 DIAGNOSIS — K2100 Gastro-esophageal reflux disease with esophagitis, without bleeding: Secondary | ICD-10-CM | POA: Insufficient documentation

## 2021-06-04 DIAGNOSIS — Z87891 Personal history of nicotine dependence: Secondary | ICD-10-CM | POA: Insufficient documentation

## 2021-06-04 HISTORY — PX: ENTEROSCOPY: SHX5533

## 2021-06-04 HISTORY — PX: BIOPSY: SHX5522

## 2021-06-04 SURGERY — ENTEROSCOPY
Anesthesia: Monitor Anesthesia Care

## 2021-06-04 MED ORDER — PROPOFOL 10 MG/ML IV BOLUS
INTRAVENOUS | Status: DC | PRN
  Administered 2021-06-04: 30 mg via INTRAVENOUS
  Administered 2021-06-04 (×2): 20 mg via INTRAVENOUS
  Administered 2021-06-04: 10 mg via INTRAVENOUS

## 2021-06-04 MED ORDER — LACTATED RINGERS IV SOLN: INTRAVENOUS | Status: DC

## 2021-06-04 MED ORDER — DEXAMETHASONE SODIUM PHOSPHATE 10 MG/ML IJ SOLN
INTRAMUSCULAR | Status: DC | PRN
  Administered 2021-06-04: 4 mg via INTRAVENOUS

## 2021-06-04 MED ORDER — ONDANSETRON HCL 4 MG/2ML IJ SOLN
INTRAMUSCULAR | Status: DC | PRN
  Administered 2021-06-04: 4 mg via INTRAVENOUS

## 2021-06-04 MED ORDER — SODIUM CHLORIDE 0.9 % IV SOLN: INTRAVENOUS | Status: DC

## 2021-06-04 MED ORDER — LIDOCAINE 2% (20 MG/ML) 5 ML SYRINGE
INTRAMUSCULAR | Status: DC | PRN
  Administered 2021-06-04: 40 mg via INTRAVENOUS

## 2021-06-04 MED ORDER — PROPOFOL 500 MG/50ML IV EMUL
INTRAVENOUS | Status: DC | PRN
  Administered 2021-06-04: 150 ug/kg/min via INTRAVENOUS

## 2021-06-04 MED ORDER — MIDAZOLAM HCL 2 MG/2ML IJ SOLN
INTRAMUSCULAR | Status: DC | PRN
  Administered 2021-06-04: 2 mg via INTRAVENOUS

## 2021-06-04 MED ORDER — PANTOPRAZOLE SODIUM 40 MG PO TBEC
40.0000 mg | DELAYED_RELEASE_TABLET | Freq: Two times a day (BID) | ORAL | 12 refills | Status: AC
Start: 2021-06-04 — End: ?
  Filled 2021-06-04: qty 180, 90d supply, fill #0

## 2021-06-04 SURGICAL SUPPLY — 15 items

## 2021-06-04 NOTE — H&P (Signed)
? ?GASTROENTEROLOGY PROCEDURE H&P NOTE  ? ?Primary Care Physician: ?Lennie Odor, PA ? ?HPI: ?Joe Morris is a 41 y.o. male who presents for F/U GIST s/p excision years ago with CT imaging concerning for thickening at anastomosis.  Will attempt enteroscopy to reach there.  Will evaluate GERD symptoms that are persistent with therapy. ? ?Past Medical History:  ?Diagnosis Date  ? Anemia 07/2014  ? Gastrointestinal hemorrhage with melena   ? [name] 07/26/2014 [facility])  ? GERD (gastroesophageal reflux disease)   ? gist dx'd 07/2017  ? Hypertension   ? Intra-abdominal abscess ([place])   ? [name] 07/13/2017  ? ?Past Surgical History:  ?Procedure Laterality Date  ? COLONOSCOPY WITH ESOPHAGOGASTRODUODENOSCOPY (EGD)  07/2014  ? [facility]"  ? IR FLUORO GUIDE CV LINE RIGHT  07/25/2017  ? IR US GUIDE VASC ACCESS RIGHT  07/25/2017  ? LAPAROSCOPY N/A 07/15/2017  ? Procedure: LAPAROSCOPY DIAGNOSTIC;  Surgeon: Greer Pickerel, MD;  Location: [place];  Service: General;  Laterality: N/A;  ? LAPAROTOMY N/A 07/15/2017  ? Procedure: EXPLORATORY LAPAROTOMY PROXIMAL SMALL BOWEL RESECTION, DRAINAGE OF ABDOMINAL ABSCESS;  Surgeon: Greer Pickerel, MD;  Location: [place];  Service: General;  Laterality: N/A;  ? ?No current facility-administered medications for this encounter.  ? ?No current facility-administered medications for this encounter. ?Allergies  ?Allergen Reactions  ? Vancomycin   ?  Kidney failure ?  ? Levaquin [Levofloxacin] Nausea And Vomiting  ? ?Family History  ?Problem Relation Age of Onset  ? Cancer Father 55  ?     prostate cancer   ? ?Social History  ? ?Socioeconomic History  ? Marital status: Divorced  ?  Spouse name: Not on file  ? Number of children: Not on file  ? Years of education: Not on file  ? Highest education level: Not on file  ?Occupational History  ? Not on file  ?Tobacco Use  ? Smoking status: Former  ? Smokeless tobacco: Never  ? Tobacco comments:  ?  "smoked for a couple months in the [employer]"  ?Vaping Use  ? Vaping Use: Never  used  ?Substance and Sexual Activity  ? Alcohol use: Yes  ?  Alcohol/week: 7.0 standard drinks  ?  Types: 7 Cans of beer per week  ? Drug use: Never  ? Sexual activity: Yes  ?Other Topics Concern  ? Not on file  ?Social History Narrative  ? Not on file  ? ?Social Determinants of Health  ? ?Financial Resource Strain: Not on file  ?Food Insecurity: Not on file  ?Transportation Needs: Not on file  ?Physical Activity: Not on file  ?Stress: Not on file  ?Social Connections: Not on file  ?Intimate Partner Violence: Not on file  ? ? ?Physical Exam: ?There were no vitals filed for this visit. ?There is no height or weight on file to calculate BMI. ?GEN: NAD ?EYE: Sclerae anicteric ?ENT: MMM ?CV: Non-tachycardic ?GI: Soft, NT/ND ?NEURO:  Alert & Oriented x 3 ? ?Lab Results: ?No results for input(s): WBC, HGB, HCT, PLT in the last 72 hours. ?BMET ?No results for input(s): NA, K, CL, CO2, GLUCOSE, BUN, CREATININE, CALCIUM in the last 72 hours. ?LFT ?No results for input(s): PROT, ALBUMIN, AST, ALT, ALKPHOS, BILITOT, BILIDIR, IBILI in the last 72 hours. ?PT/INR ?No results for input(s): LABPROT, INR in the last 72 hours. ? ? ?Impression / Plan: ?This is a 41 y.o.male who presents for F/U GIST s/p excision years ago with CT imaging concerning for thickening at anastomosis.  Will  attempt enteroscopy to reach there.  Will evaluate GERD symptoms that are persistent with therapy. ? ?The risks and benefits of endoscopic evaluation/treatment were discussed with the patient and/or family; these include but are not limited to the risk of perforation, infection, bleeding, missed lesions, lack of diagnosis, severe illness requiring hospitalization, as well as anesthesia and sedation related illnesses.  The patient's history has been reviewed, patient examined, no change in status, and deemed stable for procedure.  The patient and/or family is agreeable to proceed.  ? ? ?Justice Britain, MD ?[facility] ?Advanced  Endoscopy ?Office # [phone] ? ?

## 2021-06-04 NOTE — Transfer of Care (Signed)
Immediate Anesthesia Transfer of Care Note ? ?Patient: Joe Morris ? ?Procedure(s) Performed: ENTEROSCOPY ?ESOPHAGOGASTRODUODENOSCOPY (EGD) WITH PROPOFOL ?BIOPSY ? ?Patient Location: PACU ? ?Anesthesia Type:MAC ? ?Level of Consciousness: drowsy and patient cooperative ? ?Airway & Oxygen Therapy: Patient Spontanous Breathing and Patient connected to nasal cannula oxygen ? ?Post-op Assessment: Report given to RN and Post -op Vital signs reviewed and stable ? ?Post vital signs: Reviewed and stable ? ?Last Vitals:  ?Vitals Value Taken Time  ?BP 103/56 06/04/21 0821  ?Temp    ?Pulse 61 06/04/21 0822  ?Resp 16 06/04/21 0822  ?SpO2 99 % 06/04/21 0822  ?Vitals shown include unvalidated device data. ? ?Last Pain:  ?Vitals:  ? 06/04/21 0729  ?TempSrc: Temporal  ?PainSc: 0-No pain  ?   ? ?  ? ?Complications: No notable events documented. ?

## 2021-06-04 NOTE — Op Note (Signed)
[facility] ?Patient Name: Joe Morris ?Procedure Date : 06/04/2021 ?MRN: [id number] ?Attending MD: Justice Britain , MD ?Date of Birth: 12/17/1980 ?CSN: [id number] ?Age: 41 ?Admit Type: Outpatient ?Procedure:                Small bowel enteroscopy ?Indications:              Abnormal abdominal CT, Esophageal reflux symptoms  ?                          that recur despite appropriate therapy ?Providers:                Justice Britain, MD, Burtis Junes, RN, Primitivo Gauze  ?                          [place], Technician, [employer],  ?                          Technician ?Referring MD:             [name], Leighton Ruff. Redmond Pulling MD, MD ?Medicines:                Monitored Anesthesia Care ?Complications:            No immediate complications. ?Estimated Blood Loss:     Estimated blood loss was minimal. ?Procedure:                Pre-Anesthesia Assessment: ?                          - Prior to the procedure, a History and Physical  ?                          was performed, and patient medications and  ?                          allergies were reviewed. The patient's tolerance of  ?                          previous anesthesia was also reviewed. The risks  ?                          and benefits of the procedure and the sedation  ?                          options and risks were discussed with the patient.  ?                          All questions were answered, and informed consent  ?                          was obtained. Prior Anticoagulants: The patient has  ?                          taken no previous anticoagulant or antiplatelet  ?  agents. ASA Grade Assessment: II - A patient with  ?                          mild systemic disease. After reviewing the risks  ?                          and benefits, the patient was deemed in  ?                          satisfactory condition to undergo the procedure. ?                          After obtaining informed consent, the endoscope was   ?                          passed under direct vision. Throughout the  ?                          procedure, the patient's blood pressure, pulse, and  ?                          oxygen saturations were monitored continuously. The  ?                          PCF-190TL ([id number]) Olympus colonoscope was  ?                          introduced through the mouth and advanced to the  ?                          proximal jejunum. The small bowel enteroscopy was  ?                          accomplished without difficulty. The patient  ?                          tolerated the procedure. ?Scope In: ?Scope Out: ?Findings: ?     No gross lesions were noted in the entire esophagus. Biopsies were taken  ?     with a cold forceps for histology to rule out EoE/LoE. ?     The Z-line was irregular and was found 41 cm from the incisors. ?     Striped mildly erythematous mucosa without bleeding was found in the  ?     gastric antrum. ?     No other gross lesions were noted in the entire examined stomach.  ?     Biopsies were taken with a cold forceps for histology and Helicobacter  ?     pylori testing. ?     Normal mucosa was found in the entire duodenum. ?     Normal mucosa was found in the very proximal jejunum. ?     There was evidence of a patent enteroenterostomy in the proximal  ?     jejunum. This was characterized by mostly healthy appearing mucosa.  ?     There was one small area of nodularity in the distal portion  of the  ?     anastomosis - most likely granulation but it was removed with a cold  ?     snare for histology purposes. ?Impression:               - No gross lesions in esophagus. Biopsied. ?                          - Z-line irregular, 41 cm from the incisors. ?                          - Erythematous mucosa in the antrum. No other gross  ?                          lesions in the stomach. Biopsied. ?                          - Normal mucosa was found in the entire examined  ?                          duodenum. ?                           - Normal mucosa was found in the proximal jejunum. ?                          - Patent enteroenterostomy noted in proximal  ?                          jejunum, characterized by mostly healthy appearing  ?                          mucosa. Small area of nodularity noted at  ?                          anastomosis - most likely granulation but this was  ?                          removed for histology purposes. ?Recommendation:           - The patient will be observed post-procedure,  ?                          until all discharge criteria are met. ?                          - Discharge patient to home. ?                          - Patient has a contact number available for  ?                          emergencies. The signs and symptoms of potential  ?                          delayed complications were discussed with the  ?  patient. Return to normal activities tomorrow.  ?                          Written discharge instructions were provided to the  ?                          patient. ?                          - Resume previous diet. ?                          - Await pathology results. ?                          - Consider increasing PPI to 40 mg twice daily to  ?                          see if that makes a difference in GERD symptoms. ?                          - GERD Lifestyle modifications ?                          --Eat meals >3 hours before bedtime ?                          --Do not lay down or lie flat immediately after  ?                          eating for at least 2-hours ?                          --Do not overeat (decrease portion size and/or eat  ?                          more frequent smaller meals) ?                          --Eat slowly ?                          --Wear Loose-fitting clothes ?                          --Raise Head of Bed (Head & Chest > Feet with bed  ?                          blocks) ?                          --Avoid foods that  trigger the symptoms (Onions,  ?                          Chocolate, Caffeine, Spicy, and Fatty) ?                          --  Stop Tobacco Use ?                          --Avoid Alcohol ?                          --Maintain Heartburn Diary/Log ?                          - May follow up in clinic to discuss next steps in  ?                          further workup/management of GERD if desired. ?                          - Surveillance of region as per Oncology (CT vs  ?                          continued monitoring via Enteroscopy can be  ?                          considered if necessary). However, if the  ?                          thickening that was noted is subserosal, as I  ?                          expect, I cannot evaluate that fully and cannot get  ?                          an EUS scope to this region unfortunately. ?                          - The findings and recommendations were discussed  ?                          with the patient. ?                          - The findings and recommendations were discussed  ?                          with the patient's family. ?Procedure Code(s):        --- Professional --- ?                          [phone], Small intestinal endoscopy, enteroscopy  ?                          beyond second portion of duodenum, not including  ?                          ileum; with removal of tumor(s), polyp(s), or other  ?  lesion(s) by snare technique ?Diagnosis Code(s):        --- Professional --- ?                          K22.8, Other specified diseases of esophagus ?                          K31.89, Other diseases of stomach and duodenum ?                          Z98.0, Intestinal bypass and anastomosis status ?                          K21.9, Gastro-esophageal reflux disease without  ?                          esophagitis ?                          R93.3, Abnormal findings on diagnostic imaging of  ?                          other parts of digestive  tract ?CPT copyright 2019 American Medical Association. All rights reserved. ?The codes documented in this report are preliminary and upon coder review may  ?be revised to meet current compliance requirements

## 2021-06-04 NOTE — Anesthesia Procedure Notes (Signed)
Procedure Name: [place] ?Date/Time: 06/04/2021 7:47 AM ?Performed by: Leonor Liv, CRNA ?Pre-anesthesia Checklist: Patient identified, Emergency Drugs available, Suction available, Patient being monitored and Timeout performed ?Oxygen Delivery Method: Nasal cannula ?Airway Equipment and Method: Bite block ?Placement Confirmation: positive ETCO2 ?Dental Injury: Teeth and Oropharynx as per pre-operative assessment  ? ? ? ? ?

## 2021-06-04 NOTE — Anesthesia Preprocedure Evaluation (Addendum)
Anesthesia Evaluation  ?Patient identified by MRN, date of birth, ID band ?Patient awake ? ? ? ?Reviewed: ?Allergy & Precautions, NPO status , Patient's Chart, lab work & pertinent test results ? ?Airway ?Mallampati: I ? ?TM Distance: >3 FB ?Neck ROM: Full ? ? ? Dental ?no notable dental hx. ? ?  ?Pulmonary ?neg pulmonary ROS, former smoker,  ?  ?Pulmonary exam normal ? ? ? ? ? ? ? Cardiovascular ?hypertension, negative cardio ROS ?Normal cardiovascular exam ? ? ?  ?Neuro/Psych ?negative neurological ROS ? negative psych ROS  ? GI/Hepatic ?Neg liver ROS, GERD  Medicated,  ?Endo/Other  ?negative endocrine ROS ? Renal/GU ?  ?negative genitourinary ?  ?Musculoskeletal ?negative musculoskeletal ROS ?(+)  ? Abdominal ?Normal abdominal exam  (+)   ?Peds ?negative pediatric ROS ?(+)  Hematology ?negative hematology ROS ?(+) Blood dyscrasia, anemia ,   ?Anesthesia Other Findings ? ? Reproductive/Obstetrics ?negative OB ROS ? ?  ? ? ? ? ? ? ? ? ? ? ? ? ? ?  ?  ? ? ? ? ? ? ? ? ?Anesthesia Physical ? ?Anesthesia Plan ? ?ASA: II ? ?Anesthesia Plan: MAC  ? ?Post-op Pain Management:   ? ?Induction: Intravenous ? ?PONV Risk Score and Plan: Treatment may vary due to age or medical condition, Ondansetron, Dexamethasone, TIVA and Propofol infusion ? ?Airway Management Planned: Natural Airway and Mask ? ?Additional Equipment: None ? ?Intra-op Plan:  ? ?Post-operative Plan: Extubation in OR ? ?Informed Consent: I have reviewed the patients History and Physical, chart, labs and discussed the procedure including the risks, benefits and alternatives for the proposed anesthesia with the patient or authorized representative who has indicated his/her understanding and acceptance.  ? ? ? ?Dental advisory given ? ?Plan Discussed with: CRNA ? ?Anesthesia Plan Comments:   ? ? ? ? ? ? ?Anesthesia Quick Evaluation ? ?

## 2021-06-04 NOTE — Anesthesia Postprocedure Evaluation (Signed)
Anesthesia Post Note ? ?Patient: EDMUND HOLCOMB ? ?Procedure(s) Performed: ENTEROSCOPY ?ESOPHAGOGASTRODUODENOSCOPY (EGD) WITH PROPOFOL ?BIOPSY ? ?  ? ?Patient location during evaluation: Phase II ?Anesthesia Type: MAC ?Level of consciousness: awake ?Pain management: pain level controlled ?Vital Signs Assessment: post-procedure vital signs reviewed and stable ?Respiratory status: spontaneous breathing ?Cardiovascular status: stable ?Postop Assessment: no apparent nausea or vomiting ?Anesthetic complications: no ? ? ?No notable events documented. ? ?Last Vitals:  ?Vitals:  ? 06/04/21 0821 06/04/21 0836  ?BP: (!) 103/56 113/69  ?Pulse: 63 74  ?Resp: (!) 8 17  ?Temp: 36.5 ?C   ?SpO2: 99% 100%  ?  ?Last Pain:  ?Vitals:  ? 06/04/21 0821  ?TempSrc:   ?PainSc: Asleep  ? ? ?  ?  ?  ?  ?  ?  ? ?Huston Foley ? ? ? ? ?

## 2021-06-05 ENCOUNTER — Encounter (HOSPITAL_COMMUNITY): Payer: Self-pay | Admitting: Gastroenterology

## 2021-06-08 ENCOUNTER — Inpatient Hospital Stay: Payer: No Typology Code available for payment source | Attending: Hematology

## 2021-06-10 ENCOUNTER — Encounter: Payer: Self-pay | Admitting: Gastroenterology

## 2021-06-10 LAB — SURGICAL PATHOLOGY

## 2021-07-08 ENCOUNTER — Telehealth: Payer: Self-pay | Admitting: Hematology

## 2021-07-08 NOTE — Telephone Encounter (Signed)
Left message with rescheduled upcoming appointment due to provider's template change. 

## 2021-07-21 ENCOUNTER — Inpatient Hospital Stay (HOSPITAL_BASED_OUTPATIENT_CLINIC_OR_DEPARTMENT_OTHER): Payer: No Typology Code available for payment source | Admitting: Hematology

## 2021-07-21 ENCOUNTER — Inpatient Hospital Stay: Payer: No Typology Code available for payment source | Attending: Hematology

## 2021-07-21 ENCOUNTER — Other Ambulatory Visit: Payer: Self-pay

## 2021-07-21 ENCOUNTER — Encounter: Payer: Self-pay | Admitting: Hematology

## 2021-07-21 VITALS — BP 122/83 | HR 51 | Temp 98.3°F | Resp 18 | Ht 67.0 in | Wt 179.5 lb

## 2021-07-21 DIAGNOSIS — C49A0 Gastrointestinal stromal tumor, unspecified site: Secondary | ICD-10-CM

## 2021-07-21 DIAGNOSIS — I10 Essential (primary) hypertension: Secondary | ICD-10-CM | POA: Insufficient documentation

## 2021-07-21 DIAGNOSIS — C49A3 Gastrointestinal stromal tumor of small intestine: Secondary | ICD-10-CM

## 2021-07-21 DIAGNOSIS — Z79899 Other long term (current) drug therapy: Secondary | ICD-10-CM | POA: Insufficient documentation

## 2021-07-21 LAB — CBC WITH DIFFERENTIAL (CANCER CENTER ONLY)
Abs Immature Granulocytes: 0.02 10*3/uL (ref 0.00–0.07)
Basophils Absolute: 0.1 10*3/uL (ref 0.0–0.1)
Basophils Relative: 1 %
Eosinophils Absolute: 0.1 10*3/uL (ref 0.0–0.5)
Eosinophils Relative: 2 %
HCT: 40.7 % (ref 39.0–52.0)
Hemoglobin: 14.5 g/dL (ref 13.0–17.0)
Immature Granulocytes: 0 %
Lymphocytes Relative: 25 %
Lymphs Abs: 1.5 10*3/uL (ref 0.7–4.0)
MCH: 33.3 pg (ref 26.0–34.0)
MCHC: 35.6 g/dL (ref 30.0–36.0)
MCV: 93.3 fL (ref 80.0–100.0)
Monocytes Absolute: 0.6 10*3/uL (ref 0.1–1.0)
Monocytes Relative: 10 %
Neutro Abs: 3.6 10*3/uL (ref 1.7–7.7)
Neutrophils Relative %: 62 %
Platelet Count: 261 10*3/uL (ref 150–400)
RBC: 4.36 MIL/uL (ref 4.22–5.81)
RDW: 12.8 % (ref 11.5–15.5)
WBC Count: 5.9 10*3/uL (ref 4.0–10.5)
nRBC: 0 % (ref 0.0–0.2)

## 2021-07-21 LAB — CMP (CANCER CENTER ONLY)
ALT: 18 U/L (ref 0–44)
AST: 23 U/L (ref 15–41)
Albumin: 4.6 g/dL (ref 3.5–5.0)
Alkaline Phosphatase: 65 U/L (ref 38–126)
Anion gap: 5 (ref 5–15)
BUN: 16 mg/dL (ref 6–20)
CO2: 31 mmol/L (ref 22–32)
Calcium: 9.7 mg/dL (ref 8.9–10.3)
Chloride: 104 mmol/L (ref 98–111)
Creatinine: 1.26 mg/dL — ABNORMAL HIGH (ref 0.61–1.24)
GFR, Estimated: >60 mL/min (ref >60)
Glucose, Bld: 99 mg/dL (ref 70–99)
Potassium: 4.6 mmol/L (ref 3.5–5.1)
Sodium: 140 mmol/L (ref 135–145)
Total Bilirubin: 0.7 mg/dL (ref 0.3–1.2)
Total Protein: 6.9 g/dL (ref 6.5–8.1)

## 2021-07-21 NOTE — Progress Notes (Signed)
?[place]   ?[phone] [phone]   ?Clinic Follow up Note  ? ?Patient Care Team: ?Lennie Odor, PA as PCP - General (Nurse Practitioner) ?Elmarie Shiley, MD as Consulting Physician (Nephrology) ?Greer Pickerel, MD as Consulting Physician (General Surgery) ?[name], MD as Consulting Physician (Hematology) ? ?Date of Service:  07/21/2021 ? ?CHIEF COMPLAINT: f/u of GIST of small intestine ? ?CURRENT THERAPY:  ?Surveillance ? ?ASSESSMENT & PLAN:  ?Joe Morris is a 41 y.o. male with  ? ?1. GIST of small intestine, pT1NxM0, stage I, low grade, with perforation  ?-He is s/p laparoscopic surgery on 07/15/17 by Dr. Redmond Pulling. Pathology showed 1.7 cm GIST of proximal jejunum. ?-He started adjuvant Gleevec 09/2017. He stopped on 05/05/20 due to intolerable GI side effects. He has recovered well since stopping. He is now under surveillance  ?-surveillance CT AP on 04/20/21 showed some wall thickening along anastomotic staple line in area of prior tumor. He underwent pushed endoscopy which showed no evidence of local recurrence.  ?-Plan for repeat CT in 1 year. ?-he is clinically doing well, improving. Labs reviewed, WNL. Physical exam was unremarkable. There is no clinical concern for recurrence. ?-I will see him back in 6 months, sooner if needed  ?  ?2. GERD ?-he is on protonix '40mg'$  BID ?-he underwent small bowel enteroscopy on 06/04/21 with Dr. Rush Landmark. This was overall benign, no gross lesions, and pathology was also benign. ?  ?2. 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 ?-he has been on insurance through his job with [employer], but he is looking into coverage through the [facility] because of his prior exposure. ?  ?  ?PLAN:  ?-lab and f/u in 6 months, will order restaging CT scan on next visit  ? ? ?No problem-specific Assessment & Plan notes found for this encounter. ? ? ?INTERVAL HISTORY:  ?[name] is here for a follow up of GIST. He was last seen by me on 04/27/21. He  presents to the clinic alone. ?He reports he is doing well overall. He tells me he has been trying to lose weight to help with his indigestion. He notes this is otherwise now well managed with protonix. ?  ?All other systems were reviewed with the patient and are negative. ? ?MEDICAL HISTORY:  ?Past Medical History:  ?Diagnosis Date  ? Anemia 07/2014  ? Gastrointestinal hemorrhage with melena   ? [name] 07/26/2014 [facility])  ? GERD (gastroesophageal reflux disease)   ? gist dx'd 07/2017  ? Hypertension   ? Intra-abdominal abscess ([place])   ? [name] 07/13/2017  ? ? ?SURGICAL HISTORY: ?Past Surgical History:  ?Procedure Laterality Date  ? BIOPSY  06/04/2021  ? Procedure: BIOPSY;  Surgeon: Irving Copas., MD;  Location: [place];  Service: Gastroenterology;;  ? COLONOSCOPY WITH ESOPHAGOGASTRODUODENOSCOPY (EGD)  07/2014  ? [facility]"  ? ENTEROSCOPY N/A 06/04/2021  ? Procedure: ENTEROSCOPY;  Surgeon: Rush Landmark Telford Nab., MD;  Location: [place];  Service: Gastroenterology;  Laterality: N/A;  ? IR FLUORO GUIDE CV LINE RIGHT  07/25/2017  ? IR US GUIDE VASC ACCESS RIGHT  07/25/2017  ? LAPAROSCOPY N/A 07/15/2017  ? Procedure: LAPAROSCOPY DIAGNOSTIC;  Surgeon: Greer Pickerel, MD;  Location: [place];  Service: General;  Laterality: N/A;  ? LAPAROTOMY N/A 07/15/2017  ? Procedure: EXPLORATORY LAPAROTOMY PROXIMAL SMALL BOWEL RESECTION, DRAINAGE OF ABDOMINAL ABSCESS;  Surgeon: Greer Pickerel, MD;  Location: [place];  Service: General;  Laterality: N/A;  ? ? ?I have reviewed  the social history and family history with the patient and they are unchanged from previous note. ? ?ALLERGIES:  is allergic to vancomycin and levaquin [levofloxacin]. ? ?MEDICATIONS:  ?Current Outpatient Medications  ?Medication Sig Dispense Refill  ? buPROPion (WELLBUTRIN XL) 150 MG 24 hr tablet 1 tablet in the morning Orally Once a day 90 days (Patient not taking: Reported on 06/02/2021) 90 tablet 0  ? Cholecalciferol (VITAMIN D-3) 125 MCG (5000 UT) TABS Take by  mouth.    ? COVID-19 mRNA bivalent vaccine, Pfizer, (PFIZER COVID-19 VAC BIVALENT) injection Inject into the muscle. 0.3 mL 0  ? pantoprazole (PROTONIX) 40 MG tablet Take 1 tablet (40 mg total) by mouth 2 (two) times daily before a meal. 60 tablet 12  ? ?No current facility-administered medications for this visit.  ? ? ?PHYSICAL EXAMINATION: ?ECOG PERFORMANCE STATUS: 0 - Asymptomatic ? ?Vitals:  ? 07/21/21 0927  ?BP: 122/83  ?Pulse: (!) 51  ?Resp: 18  ?Temp: 98.3 ?F (36.8 ?C)  ?SpO2: 98%  ? ?Wt Readings from Last 3 Encounters:  ?07/21/21 179 lb 8 oz (81.4 kg)  ?06/04/21 173 lb (78.5 kg)  ?04/27/21 185 lb 14.4 oz (84.3 kg)  ?  ? ?GENERAL:alert, no distress and comfortable ?SKIN: skin color, texture, turgor are normal, no rashes or significant lesions ?EYES: normal, Conjunctiva are pink and non-injected, sclera clear  ?NECK: supple, thyroid normal size, non-tender, without nodularity ?LYMPH:  no palpable lymphadenopathy in the cervical, axillary  ?LUNGS: clear to auscultation and percussion with normal breathing effort ?HEART: regular rate & rhythm and no murmurs and no lower extremity edema ?ABDOMEN:abdomen soft, non-tender and normal bowel sounds ?Musculoskeletal:no cyanosis of digits and no clubbing  ?NEURO: alert & oriented x 3 with fluent speech, no focal motor/sensory deficits ? ?LABORATORY DATA:  ?I have reviewed the data as listed ? ?  Latest Ref Rng & Units 07/21/2021  ?  9:12 AM 04/27/2021  ?  2:57 PM 10/09/2020  ?  9:54 AM  ?CBC  ?WBC 4.0 - 10.5 K/uL 5.9   6.8   7.0    ?Hemoglobin 13.0 - 17.0 g/dL 14.5   14.0   15.0    ?Hematocrit 39.0 - 52.0 % 40.7   39.5   42.5    ?Platelets 150 - 400 K/uL 261   298   283    ? ? ? ? ?  Latest Ref Rng & Units 07/21/2021  ?  9:12 AM 04/27/2021  ?  2:57 PM 10/09/2020  ?  9:54 AM  ?CMP  ?Glucose 70 - 99 mg/dL 99   85   80    ?BUN 6 - 20 mg/dL '16   16   15    '$ ?Creatinine 0.61 - 1.24 mg/dL 1.26   1.28   1.20    ?Sodium 135 - 145 mmol/L 140   139   142    ?Potassium 3.5 - 5.1 mmol/L 4.6    3.8   4.6    ?Chloride 98 - 111 mmol/L 104   104   104    ?CO2 22 - 32 mmol/L '31   30   29    '$ ?Calcium 8.9 - 10.3 mg/dL 9.7   9.3   9.9    ?Total Protein 6.5 - 8.1 g/dL 6.9   6.9   7.1    ?Total Bilirubin 0.3 - 1.2 mg/dL 0.7   0.4   0.7    ?Alkaline Phos 38 - 126 U/L 65   72   60    ?  AST 15 - 41 U/L '23   28   23    '$ ?ALT 0 - 44 U/L '18   20   19    '$ ? ? ? ? ?RADIOGRAPHIC STUDIES: ?I have personally reviewed the radiological images as listed and agreed with the findings in the report. ?No results found.  ? ? ?No orders of the defined types were placed in this encounter. ? ?All questions were answered. The patient knows to call the clinic with any problems, questions or concerns. No barriers to learning was detected. ?The total time spent in the appointment was 20 minutes. ? ?  ? [name], MD ?07/21/2021  ? ?I, Wilburn Mylar, am acting as scribe for [name], MD.  ? ?I have reviewed the above documentation for accuracy and completeness, and I agree with the above. ?  ? ? ?

## 2021-07-22 ENCOUNTER — Telehealth: Payer: Self-pay | Admitting: Hematology

## 2021-07-22 NOTE — Telephone Encounter (Signed)
Scheduled per 5/16 los, pt has been called and confirmed  ?

## 2021-07-24 ENCOUNTER — Other Ambulatory Visit: Payer: No Typology Code available for payment source

## 2021-07-24 ENCOUNTER — Ambulatory Visit: Payer: No Typology Code available for payment source | Admitting: Hematology

## 2021-08-09 ENCOUNTER — Emergency Department (HOSPITAL_COMMUNITY): Payer: No Typology Code available for payment source

## 2021-08-09 ENCOUNTER — Other Ambulatory Visit: Payer: Self-pay

## 2021-08-09 ENCOUNTER — Encounter (HOSPITAL_COMMUNITY): Payer: Self-pay | Admitting: Emergency Medicine

## 2021-08-09 ENCOUNTER — Emergency Department (HOSPITAL_COMMUNITY)
Admission: EM | Admit: 2021-08-09 | Discharge: 2021-08-09 | Disposition: A | Discharge status: Home / Self Care | Payer: No Typology Code available for payment source | Attending: Emergency Medicine | Admitting: Emergency Medicine

## 2021-08-09 DIAGNOSIS — M5432 Sciatica, left side: Secondary | ICD-10-CM | POA: Insufficient documentation

## 2021-08-09 DIAGNOSIS — M545 Low back pain, unspecified: Secondary | ICD-10-CM | POA: Diagnosis present

## 2021-08-09 HISTORY — DX: Anxiety disorder, unspecified: F41.9

## 2021-08-09 MED ORDER — DIAZEPAM 5 MG PO TABS: 5.0000 mg | ORAL_TABLET | Freq: Two times a day (BID) | ORAL | 0 refills | Status: AC | PRN

## 2021-08-09 MED ORDER — METHYLPREDNISOLONE 4 MG PO TBPK: ORAL_TABLET | ORAL | 0 refills | Status: DC

## 2021-08-09 MED ORDER — DICLOFENAC SODIUM 1 % EX GEL: 4.0000 g | Freq: Four times a day (QID) | CUTANEOUS | 0 refills | Status: AC

## 2021-08-09 MED ORDER — ACETAMINOPHEN 500 MG PO TABS
1000.0000 mg | ORAL_TABLET | Freq: Once | ORAL | Status: AC
  Administered 2021-08-09: 1000 mg via ORAL
  Filled 2021-08-09: qty 2

## 2021-08-09 MED ORDER — DIAZEPAM 5 MG PO TABS
5.0000 mg | ORAL_TABLET | Freq: Once | ORAL | Status: AC
  Administered 2021-08-09: 5 mg via ORAL
  Filled 2021-08-09: qty 1

## 2021-08-09 MED ORDER — OXYCODONE HCL 5 MG PO TABS
5.0000 mg | ORAL_TABLET | Freq: Once | ORAL | Status: AC
  Administered 2021-08-09: 5 mg via ORAL
  Filled 2021-08-09: qty 1

## 2021-08-09 MED ORDER — KETOROLAC TROMETHAMINE 15 MG/ML IJ SOLN
15.0000 mg | Freq: Once | INTRAMUSCULAR | Status: AC
  Administered 2021-08-09: 15 mg via INTRAMUSCULAR
  Filled 2021-08-09: qty 1
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
## 2021-08-09 NOTE — Discharge Instructions (Addendum)
Your back pain is most likely due to a muscular strain.  There is been a lot of research on back pain, unfortunately the only thing that seems to really help is Tylenol and ibuprofen.  Relative rest is also important to not lift greater than 10 pounds bending or twisting at the waist.  Please follow-up with your family physician.  The other thing that really seems to benefit patients is physical therapy which your doctor may send you for.  Please return to the emergency department for new numbness or weakness to your arms or legs. Difficulty with urinating or urinating or pooping on yourself.  Also if you cannot feel toilet paper when you wipe or get a fever.   Use the gel as prescribed.  Take the steroids as prescribed.  Follow up with your neurosurgeon.  Try to avoid motrin and naproxyn until you have finished the steroid pack.  Also take tylenol '1000mg'$ (2 extra strength) four times a day.   [email or web address]

## 2021-08-09 NOTE — ED Notes (Signed)
Pt verbalizes understanding of discharge instructions. Opportunity for questions and answers were provided. Pt discharged from the ED.   ?

## 2021-08-17 ENCOUNTER — Other Ambulatory Visit: Payer: Self-pay | Admitting: Physician Assistant

## 2021-08-17 DIAGNOSIS — M545 Low back pain, unspecified: Secondary | ICD-10-CM

## 2021-08-21 ENCOUNTER — Ambulatory Visit
Admission: RE | Admit: 2021-08-21 | Discharge: 2021-08-21 | Disposition: A | Discharge status: Home / Self Care | Payer: No Typology Code available for payment source | Source: Ambulatory Visit | Attending: Physician Assistant | Admitting: Physician Assistant

## 2021-08-21 DIAGNOSIS — M545 Low back pain, unspecified: Secondary | ICD-10-CM

## 2021-08-22 ENCOUNTER — Other Ambulatory Visit: Payer: Self-pay

## 2021-08-22 ENCOUNTER — Emergency Department (HOSPITAL_COMMUNITY)
Admission: EM | Admit: 2021-08-22 | Discharge: 2021-08-22 | Disposition: A | Discharge status: Home / Self Care | Payer: No Typology Code available for payment source | Attending: Emergency Medicine | Admitting: Emergency Medicine

## 2021-08-22 ENCOUNTER — Encounter (HOSPITAL_COMMUNITY): Payer: Self-pay | Admitting: Emergency Medicine

## 2021-08-22 DIAGNOSIS — Y92410 Unspecified street and highway as the place of occurrence of the external cause: Secondary | ICD-10-CM | POA: Diagnosis not present

## 2021-08-22 DIAGNOSIS — I10 Essential (primary) hypertension: Secondary | ICD-10-CM | POA: Diagnosis not present

## 2021-08-22 DIAGNOSIS — M545 Low back pain, unspecified: Secondary | ICD-10-CM | POA: Diagnosis present

## 2021-08-22 DIAGNOSIS — M79605 Pain in left leg: Secondary | ICD-10-CM | POA: Insufficient documentation

## 2021-08-22 DIAGNOSIS — M5136 Other intervertebral disc degeneration, lumbar region: Secondary | ICD-10-CM

## 2021-08-22 MED ORDER — OXYCODONE-ACETAMINOPHEN 5-325 MG PO TABS: 2.0000 | ORAL_TABLET | Freq: Four times a day (QID) | ORAL | 0 refills | Status: AC | PRN

## 2021-08-22 NOTE — Discharge Instructions (Addendum)
You came to the emerge apartment today to be evaluated for your back pain and concern for urinary retention.  I spoke with Dr. Ellene Route with [place] [facility] who reviewed your MRI imaging.  The plan is for you to follow-up with Dr. Saintclair Halsted at your scheduled appointment on 6/20.  I have given you prescription of pain medication to help with your pain during that time.  You are being prescribed a medication which may make you sleepy or mpair your ability to make decisions.  For 24 hours after taking this medication please do not drive, operate heavy machinery, care for a small child with out another adult present, or perform any activities that may cause harm to you or someone else if you were to fall asleep or be impaired.   Get help right away if: You develop new bowel or bladder control problems. You have unusual weakness or numbness in your arms or legs. You feel faint. You developing worsening pain that is not controlled by your pain medication

## 2021-08-22 NOTE — ED Triage Notes (Signed)
Pt reported to ED with c/o radiating pain that travels from left hip downwards on LLE. Pt states he was recently diagnosed with sciatica last Sunday and felt today that pain radiates into groin and is causing difficulty emptying bladder.
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
## 2021-08-22 NOTE — ED Notes (Signed)
Pt bladder scanned 54m found

## 2021-08-25 ENCOUNTER — Other Ambulatory Visit: Payer: Self-pay | Admitting: Neurosurgery

## 2021-08-26 NOTE — Pre-Procedure Instructions (Signed)
Surgical Instructions    Your procedure is scheduled on August 31, 2021.  Report to [facility] Main Entrance "A" at 9:00 A.M., then check in with the Admitting office.  Call this number if you have problems the morning of surgery:  [phone]   If you have any questions prior to your surgery date call [phone]: Open Monday-Friday 8am-4pm    Remember:  Do not eat or drink after midnight the night before your surgery.    Take these medicines the morning of surgery with A SIP OF WATER:  buPROPion (WELLBUTRIN XL)   cyclobenzaprine (FLEXERIL)   gabapentin (NEURONTIN)   pantoprazole (PROTONIX)   As of today, STOP taking any Aspirin (unless otherwise instructed by your surgeon) Aleve, Naproxen, Ibuprofen, Motrin, Advil, Goody's, BC's, all herbal medications, fish oil, and all vitamins.                     Do NOT Smoke (Tobacco/Vaping) for 24 hours prior to your procedure.  If you use a CPAP at night, you may bring your mask/headgear for your overnight stay.   Contacts, glasses, piercing's, hearing aid's, dentures or partials may not be worn into surgery, please bring cases for these belongings.    For patients admitted to the hospital, discharge time will be determined by your treatment team.   Patients discharged the day of surgery will not be allowed to drive home, and someone needs to stay with them for 24 hours.  SURGICAL WAITING ROOM VISITATION Patients having surgery or a procedure may have two support people in the waiting room. These visitors may be switched out with other visitors if needed. Children under the age of 39 must have an adult accompany them who is not the patient. If the patient needs to stay at the hospital during part of their recovery, the visitor guidelines for inpatient rooms apply.  Please refer to the [facility] website for the visitor guidelines for Inpatients (after your surgery is over and you are in a regular room).    Special instructions:    [place]- Preparing For Surgery  Before surgery, you can play an important role. Because skin is not sterile, your skin needs to be as free of germs as possible. You can reduce the number of germs on your skin by washing with CHG (chlorahexidine gluconate) Soap before surgery.  CHG is an antiseptic cleaner which kills germs and bonds with the skin to continue killing germs even after washing.    Oral Hygiene is also important to reduce your risk of infection.  Remember - BRUSH YOUR TEETH THE MORNING OF SURGERY WITH YOUR REGULAR TOOTHPASTE  Please do not use if you have an allergy to CHG or antibacterial soaps. If your skin becomes reddened/irritated stop using the CHG.  Do not shave (including legs and underarms) for at least 48 hours prior to first CHG shower. It is OK to shave your face.  Please follow these instructions carefully.   Shower the NIGHT BEFORE SURGERY and the MORNING OF SURGERY  If you chose to wash your hair, wash your hair first as usual with your normal shampoo.  After you shampoo, rinse your hair and body thoroughly to remove the shampoo.  Use CHG Soap as you would any other liquid soap. You can apply CHG directly to the skin and wash gently with a scrungie or a clean washcloth.   Apply the CHG Soap to your body ONLY FROM THE NECK DOWN.  Do not use on  open wounds or open sores. Avoid contact with your eyes, ears, mouth and genitals (private parts). Wash Face and genitals (private parts)  with your normal soap.   Wash thoroughly, paying special attention to the area where your surgery will be performed.  Thoroughly rinse your body with warm water from the neck down.  DO NOT shower/wash with your normal soap after using and rinsing off the CHG Soap.  Pat yourself dry with a CLEAN TOWEL.  Wear CLEAN PAJAMAS to bed the night before surgery  Place CLEAN SHEETS on your bed the night before your surgery  DO NOT SLEEP WITH PETS.   Day of Surgery: Take a shower  with CHG soap.  Do not wear jewelry or makeup Do not wear lotions, powders, perfumes/colognes, or deodorant. Do not shave 48 hours prior to surgery.  Men may shave face and neck. Do not bring valuables to the hospital.  [facility] is not responsible for any belongings or valuables. Do not wear nail polish, gel polish, artificial nails, or any other type of covering on natural nails (fingers and toes) If you have artificial nails or gel coating that need to be removed by a nail salon, please have this removed prior to surgery. Artificial nails or gel coating may interfere with anesthesia's ability to adequately monitor your vital signs.  Wear Clean/Comfortable clothing the morning of surgery  Remember to brush your teeth WITH YOUR REGULAR TOOTHPASTE.   Please read over the following fact sheets that you were given.    If you received a COVID test during your pre-op visit  it is requested that you wear a mask when out in public, stay away from anyone that may not be feeling well and notify your surgeon if you develop symptoms. If you have been in contact with anyone that has tested positive in the last 10 days please notify you surgeon.

## 2021-08-27 ENCOUNTER — Other Ambulatory Visit: Payer: Self-pay

## 2021-08-27 ENCOUNTER — Encounter (HOSPITAL_COMMUNITY)
Admission: RE | Admit: 2021-08-27 | Discharge: 2021-08-27 | Disposition: A | Discharge status: Home / Self Care | Payer: No Typology Code available for payment source | Source: Ambulatory Visit | Attending: Neurosurgery | Admitting: Neurosurgery

## 2021-08-27 ENCOUNTER — Encounter (HOSPITAL_COMMUNITY): Payer: Self-pay

## 2021-08-27 VITALS — BP 139/98 | HR 75 | Temp 97.8°F | Resp 17 | Ht 67.0 in | Wt 170.3 lb

## 2021-08-27 DIAGNOSIS — I10 Essential (primary) hypertension: Secondary | ICD-10-CM | POA: Diagnosis not present

## 2021-08-27 DIAGNOSIS — Z01818 Encounter for other preprocedural examination: Secondary | ICD-10-CM | POA: Diagnosis present

## 2021-08-27 LAB — SURGICAL PCR SCREEN
MRSA, PCR: NEGATIVE
Staphylococcus aureus: NEGATIVE

## 2021-08-27 LAB — BASIC METABOLIC PANEL
Anion gap: 10 (ref 5–15)
BUN: 13 mg/dL (ref 6–20)
CO2: 30 mmol/L (ref 22–32)
Calcium: 9.2 mg/dL (ref 8.9–10.3)
Chloride: 103 mmol/L (ref 98–111)
Creatinine, Ser: 1.15 mg/dL (ref 0.61–1.24)
GFR, Estimated: >60 mL/min (ref >60)
Glucose, Bld: 88 mg/dL (ref 70–99)
Potassium: 4.5 mmol/L (ref 3.5–5.1)
Sodium: 143 mmol/L (ref 135–145)

## 2021-08-27 LAB — CBC
HCT: 42.1 % (ref 39.0–52.0)
Hemoglobin: 14.7 g/dL (ref 13.0–17.0)
MCH: 33.1 pg (ref 26.0–34.0)
MCHC: 34.9 g/dL (ref 30.0–36.0)
MCV: 94.8 fL (ref 80.0–100.0)
Platelets: 292 10*3/uL (ref 150–400)
RBC: 4.44 MIL/uL (ref 4.22–5.81)
RDW: 12.3 % (ref 11.5–15.5)
WBC: 7.4 10*3/uL (ref 4.0–10.5)
nRBC: 0 % (ref 0.0–0.2)

## 2021-08-27 LAB — TYPE AND SCREEN
ABO/RH(D): O POS
Antibody Screen: NEGATIVE

## 2021-08-27 NOTE — Progress Notes (Signed)
PCP - Lennie Odor, PA Cardiologist - denies  PPM/ICD - denies   Chest x-ray - 08/06/17 EKG - 08/27/21 Stress Test - denies ECHO - denies Cardiac Cath - denies  Sleep Study - denies   DM- denies  ASA/Blood Thinner Instructions: n/a  ERAS Protcol - no, NPO   COVID TEST- n/a   Anesthesia review: no  Patient denies shortness of breath, fever, cough and chest pain at PAT appointment   All instructions explained to the patient, with a verbal understanding of the material. Patient agrees to go over the instructions while at home for a better understanding.  The opportunity to ask questions was provided.

## 2021-08-31 ENCOUNTER — Ambulatory Visit (HOSPITAL_COMMUNITY)
Admission: RE | Admit: 2021-08-31 | Discharge: 2021-09-01 | Disposition: A | Discharge status: Home / Self Care | Payer: No Typology Code available for payment source | Attending: Neurosurgery | Admitting: Neurosurgery

## 2021-08-31 ENCOUNTER — Other Ambulatory Visit: Payer: Self-pay

## 2021-08-31 ENCOUNTER — Ambulatory Visit (HOSPITAL_COMMUNITY): Payer: No Typology Code available for payment source | Admitting: Certified Registered"

## 2021-08-31 ENCOUNTER — Ambulatory Visit (HOSPITAL_BASED_OUTPATIENT_CLINIC_OR_DEPARTMENT_OTHER): Payer: No Typology Code available for payment source | Admitting: Certified Registered"

## 2021-08-31 ENCOUNTER — Ambulatory Visit (HOSPITAL_COMMUNITY): Payer: No Typology Code available for payment source

## 2021-08-31 ENCOUNTER — Encounter (HOSPITAL_COMMUNITY): Payer: Self-pay | Admitting: Neurosurgery

## 2021-08-31 ENCOUNTER — Ambulatory Visit (HOSPITAL_COMMUNITY)
Admission: RE | Disposition: A | Discharge status: Home / Self Care | Payer: Self-pay | Source: Home / Self Care | Attending: Neurosurgery

## 2021-08-31 DIAGNOSIS — M5127 Other intervertebral disc displacement, lumbosacral region: Secondary | ICD-10-CM

## 2021-08-31 DIAGNOSIS — Z23 Encounter for immunization: Secondary | ICD-10-CM | POA: Diagnosis not present

## 2021-08-31 DIAGNOSIS — M532X7 Spinal instabilities, lumbosacral region: Secondary | ICD-10-CM

## 2021-08-31 DIAGNOSIS — I10 Essential (primary) hypertension: Secondary | ICD-10-CM | POA: Insufficient documentation

## 2021-08-31 DIAGNOSIS — K219 Gastro-esophageal reflux disease without esophagitis: Secondary | ICD-10-CM | POA: Insufficient documentation

## 2021-08-31 DIAGNOSIS — M5126 Other intervertebral disc displacement, lumbar region: Secondary | ICD-10-CM | POA: Diagnosis present

## 2021-08-31 HISTORY — PX: TRANSFORAMINAL LUMBAR INTERBODY FUSION (TLIF) WITH PEDICLE SCREW FIXATION 1 LEVEL: SHX6141

## 2021-08-31 SURGERY — TRANSFORAMINAL LUMBAR INTERBODY FUSION (TLIF) WITH PEDICLE SCREW FIXATION 1 LEVEL
Anesthesia: General | Site: Back | Laterality: Left

## 2021-08-31 MED ORDER — PANTOPRAZOLE SODIUM 40 MG IV SOLR: 40.0000 mg | Freq: Every day | INTRAVENOUS | Status: DC

## 2021-08-31 MED ORDER — CHLORHEXIDINE GLUCONATE 0.12 % MT SOLN
OROMUCOSAL | Status: AC
  Administered 2021-08-31: 15 mL via OROMUCOSAL
  Filled 2021-08-31: qty 15

## 2021-08-31 MED ORDER — MIDAZOLAM HCL 2 MG/2ML IJ SOLN
INTRAMUSCULAR | Status: DC | PRN
  Administered 2021-08-31: 2 mg via INTRAVENOUS

## 2021-08-31 MED ORDER — ACETAMINOPHEN 650 MG RE SUPP: 650.0000 mg | RECTAL | Status: DC | PRN

## 2021-08-31 MED ORDER — MENTHOL 3 MG MT LOZG: 1.0000 | LOZENGE | OROMUCOSAL | Status: DC | PRN

## 2021-08-31 MED ORDER — DEXAMETHASONE SODIUM PHOSPHATE 10 MG/ML IJ SOLN
INTRAMUSCULAR | Status: DC | PRN
  Administered 2021-08-31: 10 mg via INTRAVENOUS

## 2021-08-31 MED ORDER — PANTOPRAZOLE SODIUM 40 MG PO TBEC
40.0000 mg | DELAYED_RELEASE_TABLET | Freq: Every morning | ORAL | Status: DC
  Administered 2021-09-01: 40 mg via ORAL
  Filled 2021-08-31: qty 1

## 2021-08-31 MED ORDER — SODIUM CHLORIDE 0.9% FLUSH: 3.0000 mL | INTRAVENOUS | Status: DC | PRN

## 2021-08-31 MED ORDER — CYCLOBENZAPRINE HCL 10 MG PO TABS: 10.0000 mg | ORAL_TABLET | Freq: Three times a day (TID) | ORAL | Status: DC | PRN

## 2021-08-31 MED ORDER — BUPROPION HCL ER (XL) 150 MG PO TB24
150.0000 mg | ORAL_TABLET | Freq: Every day | ORAL | Status: DC
  Administered 2021-09-01: 150 mg via ORAL
  Filled 2021-08-31: qty 1

## 2021-08-31 MED ORDER — ACETAMINOPHEN 325 MG PO TABS: 650.0000 mg | ORAL_TABLET | ORAL | Status: DC | PRN

## 2021-08-31 MED ORDER — ROCURONIUM BROMIDE 10 MG/ML (PF) SYRINGE
PREFILLED_SYRINGE | INTRAVENOUS | Status: DC | PRN
  Administered 2021-08-31: 100 mg via INTRAVENOUS
  Administered 2021-08-31: 20 mg via INTRAVENOUS

## 2021-08-31 MED ORDER — KETAMINE HCL 50 MG/5ML IJ SOSY
PREFILLED_SYRINGE | INTRAMUSCULAR | Status: AC
Start: 2021-08-31 — End: ?
  Filled 2021-08-31: qty 5

## 2021-08-31 MED ORDER — DEXAMETHASONE SODIUM PHOSPHATE 10 MG/ML IJ SOLN
INTRAMUSCULAR | Status: AC
Start: 2021-08-31 — End: ?
  Filled 2021-08-31: qty 1

## 2021-08-31 MED ORDER — LIDOCAINE 2% (20 MG/ML) 5 ML SYRINGE
INTRAMUSCULAR | Status: AC
  Filled 2021-08-31: qty 5

## 2021-08-31 MED ORDER — HYDROCODONE-ACETAMINOPHEN 5-325 MG PO TABS
2.0000 | ORAL_TABLET | ORAL | Status: DC | PRN
  Administered 2021-08-31 (×2): 2 via ORAL
  Filled 2021-08-31 (×2): qty 2

## 2021-08-31 MED ORDER — GABAPENTIN 300 MG PO CAPS
300.0000 mg | ORAL_CAPSULE | Freq: Three times a day (TID) | ORAL | Status: DC
  Administered 2021-08-31: 300 mg via ORAL
  Filled 2021-08-31: qty 1

## 2021-08-31 MED ORDER — OXYCODONE-ACETAMINOPHEN 5-325 MG PO TABS
1.0000 | ORAL_TABLET | ORAL | Status: DC | PRN
  Administered 2021-08-31 – 2021-09-01 (×4): 2 via ORAL
  Filled 2021-08-31 (×4): qty 2

## 2021-08-31 MED ORDER — LIDOCAINE-EPINEPHRINE 1 %-1:100000 IJ SOLN
INTRAMUSCULAR | Status: DC | PRN
  Administered 2021-08-31: 20 mL

## 2021-08-31 MED ORDER — CYCLOBENZAPRINE HCL 10 MG PO TABS
10.0000 mg | ORAL_TABLET | Freq: Two times a day (BID) | ORAL | Status: DC
  Administered 2021-08-31: 10 mg via ORAL
  Filled 2021-08-31: qty 1

## 2021-08-31 MED ORDER — AMISULPRIDE (ANTIEMETIC) 5 MG/2ML IV SOLN: 10.0000 mg | Freq: Once | INTRAVENOUS | Status: DC | PRN

## 2021-08-31 MED ORDER — ADULT MULTIVITAMIN W/MINERALS CH
1.0000 | ORAL_TABLET | Freq: Every day | ORAL | Status: DC
  Administered 2021-09-01: 1 via ORAL
  Filled 2021-08-31: qty 1

## 2021-08-31 MED ORDER — CEFAZOLIN SODIUM-DEXTROSE 2-4 GM/100ML-% IV SOLN
2.0000 g | Freq: Three times a day (TID) | INTRAVENOUS | Status: AC
  Administered 2021-08-31 (×2): 2 g via INTRAVENOUS
  Filled 2021-08-31 (×2): qty 100

## 2021-08-31 MED ORDER — FENTANYL CITRATE (PF) 100 MCG/2ML IJ SOLN
25.0000 ug | INTRAMUSCULAR | Status: DC | PRN
  Administered 2021-08-31 (×2): 25 ug via INTRAVENOUS

## 2021-08-31 MED ORDER — ACETAMINOPHEN 500 MG PO TABS
ORAL_TABLET | ORAL | Status: AC
  Administered 2021-08-31: 1000 mg via ORAL
  Filled 2021-08-31: qty 2

## 2021-08-31 MED ORDER — FENTANYL CITRATE (PF) 100 MCG/2ML IJ SOLN
INTRAMUSCULAR | Status: AC
  Filled 2021-08-31: qty 2

## 2021-08-31 MED ORDER — FENTANYL CITRATE (PF) 250 MCG/5ML IJ SOLN
INTRAMUSCULAR | Status: DC | PRN
  Administered 2021-08-31 (×2): 50 ug via INTRAVENOUS
  Administered 2021-08-31: 100 ug via INTRAVENOUS

## 2021-08-31 MED ORDER — THROMBIN 20000 UNITS EX SOLR: CUTANEOUS | Status: DC | PRN

## 2021-08-31 MED ORDER — SODIUM CHLORIDE 0.9 % IV SOLN
250.0000 mL | INTRAVENOUS | Status: DC
  Administered 2021-08-31: 250 mL via INTRAVENOUS

## 2021-08-31 MED ORDER — DIAZEPAM 5 MG PO TABS
5.0000 mg | ORAL_TABLET | Freq: Four times a day (QID) | ORAL | Status: DC | PRN
  Administered 2021-08-31 – 2021-09-01 (×2): 5 mg via ORAL
  Filled 2021-08-31 (×2): qty 1

## 2021-08-31 MED ORDER — LIDOCAINE-EPINEPHRINE 1 %-1:100000 IJ SOLN
INTRAMUSCULAR | Status: AC
Start: 2021-08-31 — End: ?
  Filled 2021-08-31: qty 1

## 2021-08-31 MED ORDER — ACETAMINOPHEN 500 MG PO TABS: 1000.0000 mg | ORAL_TABLET | Freq: Once | ORAL | Status: AC

## 2021-08-31 MED ORDER — ROCURONIUM BROMIDE 10 MG/ML (PF) SYRINGE
PREFILLED_SYRINGE | INTRAVENOUS | Status: AC
Start: 2021-08-31 — End: ?
  Filled 2021-08-31: qty 10

## 2021-08-31 MED ORDER — PHENOL 1.4 % MT LIQD: 1.0000 | OROMUCOSAL | Status: DC | PRN

## 2021-08-31 MED ORDER — PROPOFOL 10 MG/ML IV BOLUS
INTRAVENOUS | Status: AC
  Filled 2021-08-31: qty 20

## 2021-08-31 MED ORDER — LIDOCAINE 2% (20 MG/ML) 5 ML SYRINGE
INTRAMUSCULAR | Status: DC | PRN
  Administered 2021-08-31: 100 mg via INTRAVENOUS

## 2021-08-31 MED ORDER — SUGAMMADEX SODIUM 200 MG/2ML IV SOLN
INTRAVENOUS | Status: DC | PRN
  Administered 2021-08-31: 200 mg via INTRAVENOUS

## 2021-08-31 MED ORDER — ONDANSETRON HCL 4 MG PO TABS: 4.0000 mg | ORAL_TABLET | Freq: Four times a day (QID) | ORAL | Status: DC | PRN

## 2021-08-31 MED ORDER — CEFAZOLIN SODIUM-DEXTROSE 2-4 GM/100ML-% IV SOLN
INTRAVENOUS | Status: AC
  Filled 2021-08-31: qty 100

## 2021-08-31 MED ORDER — BUPIVACAINE LIPOSOME 1.3 % IJ SUSP
INTRAMUSCULAR | Status: DC | PRN
  Administered 2021-08-31: 20 mL

## 2021-08-31 MED ORDER — ONDANSETRON HCL 4 MG/2ML IJ SOLN: 4.0000 mg | Freq: Four times a day (QID) | INTRAMUSCULAR | Status: DC | PRN

## 2021-08-31 MED ORDER — CHLORHEXIDINE GLUCONATE CLOTH 2 % EX PADS: 6.0000 | MEDICATED_PAD | Freq: Once | CUTANEOUS | Status: DC

## 2021-08-31 MED ORDER — MIDAZOLAM HCL 2 MG/2ML IJ SOLN
INTRAMUSCULAR | Status: AC
Start: 2021-08-31 — End: ?
  Filled 2021-08-31: qty 2

## 2021-08-31 MED ORDER — ALUM & MAG HYDROXIDE-SIMETH 200-200-20 MG/5ML PO SUSP
30.0000 mL | Freq: Four times a day (QID) | ORAL | Status: DC | PRN
Start: 2021-08-31 — End: 2021-09-01

## 2021-08-31 MED ORDER — SODIUM CHLORIDE 0.9% FLUSH
3.0000 mL | Freq: Two times a day (BID) | INTRAVENOUS | Status: DC
  Administered 2021-08-31: 3 mL via INTRAVENOUS

## 2021-08-31 MED ORDER — CHLORHEXIDINE GLUCONATE 0.12 % MT SOLN: 15.0000 mL | Freq: Once | OROMUCOSAL | Status: AC

## 2021-08-31 MED ORDER — ONDANSETRON HCL 4 MG/2ML IJ SOLN
INTRAMUSCULAR | Status: DC | PRN
  Administered 2021-08-31: 4 mg via INTRAVENOUS

## 2021-08-31 MED ORDER — PROMETHAZINE HCL 25 MG/ML IJ SOLN: 6.2500 mg | INTRAMUSCULAR | Status: DC | PRN

## 2021-08-31 MED ORDER — 0.9 % SODIUM CHLORIDE (POUR BTL) OPTIME
TOPICAL | Status: DC | PRN
  Administered 2021-08-31: 1000 mL

## 2021-08-31 MED ORDER — FENTANYL CITRATE (PF) 250 MCG/5ML IJ SOLN
INTRAMUSCULAR | Status: AC
  Filled 2021-08-31: qty 5

## 2021-08-31 MED ORDER — PROPOFOL 10 MG/ML IV BOLUS
INTRAVENOUS | Status: DC | PRN
  Administered 2021-08-31: 50 mg via INTRAVENOUS
  Administered 2021-08-31: 150 mg via INTRAVENOUS
  Administered 2021-08-31: 50 mg via INTRAVENOUS

## 2021-08-31 MED ORDER — CEFAZOLIN SODIUM-DEXTROSE 2-4 GM/100ML-% IV SOLN
2.0000 g | INTRAVENOUS | Status: AC
  Administered 2021-08-31: 2 g via INTRAVENOUS

## 2021-08-31 MED ORDER — ORAL CARE MOUTH RINSE: 15.0000 mL | Freq: Once | OROMUCOSAL | Status: AC

## 2021-08-31 MED ORDER — KETAMINE HCL 10 MG/ML IJ SOLN
INTRAMUSCULAR | Status: DC | PRN
  Administered 2021-08-31: 10 mg via INTRAVENOUS
  Administered 2021-08-31: 30 mg via INTRAVENOUS

## 2021-08-31 MED ORDER — THROMBIN 20000 UNITS EX SOLR
CUTANEOUS | Status: AC
  Filled 2021-08-31: qty 20000

## 2021-08-31 MED ORDER — LACTATED RINGERS IV SOLN: INTRAVENOUS | Status: DC

## 2021-08-31 MED ORDER — BUPIVACAINE LIPOSOME 1.3 % IJ SUSP
INTRAMUSCULAR | Status: AC
  Filled 2021-08-31: qty 20

## 2021-08-31 MED ORDER — VITAMIN D 25 MCG (1000 UNIT) PO TABS
5000.0000 [IU] | ORAL_TABLET | Freq: Every day | ORAL | Status: DC
  Administered 2021-09-01: 5000 [IU] via ORAL
  Filled 2021-08-31 (×2): qty 5

## 2021-08-31 MED ORDER — HYDROMORPHONE HCL 1 MG/ML IJ SOLN
0.5000 mg | INTRAMUSCULAR | Status: DC | PRN
  Administered 2021-08-31: 0.5 mg via INTRAVENOUS
  Filled 2021-08-31: qty 0.5

## 2021-08-31 SURGICAL SUPPLY — 72 items
BAG COUNTER SPONGE SURGICOUNT (BAG) ×3 IMPLANT
BENZOIN TINCTURE PRP APPL 2/3 (GAUZE/BANDAGES/DRESSINGS) ×2 IMPLANT
BIT DRILL 5.0/4.0 (BIT) IMPLANT
BLADE CLIPPER SURG (BLADE) IMPLANT
BLADE SURG 11 STRL SS (BLADE) ×2 IMPLANT
BONE VIVIGEN FORMABLE 5.4CC (Bone Implant) ×2 IMPLANT
BUR MATCHSTICK NEURO 3.0 LAGG (BURR) ×2 IMPLANT
BUR PRECISION FLUTE 6.0 (BURR) ×2 IMPLANT
CAGE ALTERA 8X12-8 (Cage) ×1 IMPLANT
CANISTER SUCT 3000ML PPV (MISCELLANEOUS) ×2 IMPLANT
CAP LOCKING THREADED (Cap) ×4 IMPLANT
CARTRIDGE OIL MAESTRO DRILL (MISCELLANEOUS) ×1 IMPLANT
CLSR STERI-STRIP ANTIMIC 1/2X4 (GAUZE/BANDAGES/DRESSINGS) ×1 IMPLANT
CNTNR URN SCR LID CUP LEK RST (MISCELLANEOUS) ×1 IMPLANT
CONT SPEC 4OZ STRL OR WHT (MISCELLANEOUS) ×1
COVER BACK TABLE 60X90IN (DRAPES) ×2 IMPLANT
DERMABOND ADVANCED (GAUZE/BANDAGES/DRESSINGS) ×1
DERMABOND ADVANCED .7 DNX12 (GAUZE/BANDAGES/DRESSINGS) ×1 IMPLANT
DIFFUSER DRILL AIR PNEUMATIC (MISCELLANEOUS) ×2 IMPLANT
DRAPE C-ARM 42X72 X-RAY (DRAPES) ×2 IMPLANT
DRAPE C-ARMOR (DRAPES) ×2 IMPLANT
DRAPE HALF SHEET 40X57 (DRAPES) IMPLANT
DRAPE LAPAROTOMY 100X72X124 (DRAPES) ×2 IMPLANT
DRAPE POUCH INSTRU U-SHP 10X18 (DRAPES) ×2 IMPLANT
DRAPE SURG 17X23 STRL (DRAPES) ×2 IMPLANT
DRILL 5.0/4.0 (BIT) ×2
DRSG OPSITE POSTOP 4X6 (GAUZE/BANDAGES/DRESSINGS) ×1 IMPLANT
DURAPREP 26ML APPLICATOR (WOUND CARE) ×2 IMPLANT
ELECT REM PT RETURN 9FT ADLT (ELECTROSURGICAL) ×2
ELECTRODE REM PT RTRN 9FT ADLT (ELECTROSURGICAL) ×1 IMPLANT
EVACUATOR 3/16  PVC DRAIN (DRAIN) ×1
EVACUATOR 3/16 PVC DRAIN (DRAIN) ×1 IMPLANT
GAUZE 4X4 16PLY ~~LOC~~+RFID DBL (SPONGE) IMPLANT
GAUZE SPONGE 4X4 12PLY STRL (GAUZE/BANDAGES/DRESSINGS) ×2 IMPLANT
GLOVE BIO SURGEON STRL SZ7 (GLOVE) IMPLANT
GLOVE BIO SURGEON STRL SZ8 (GLOVE) ×4 IMPLANT
GLOVE BIOGEL PI IND STRL 7.0 (GLOVE) IMPLANT
GLOVE BIOGEL PI INDICATOR 7.0 (GLOVE)
GLOVE ECLIPSE 7.5 STRL STRAW (GLOVE) IMPLANT
GLOVE EXAM NITRILE XL STR (GLOVE) IMPLANT
GLOVE INDICATOR 8.5 STRL (GLOVE) ×8 IMPLANT
GOWN STRL REUS W/ TWL LRG LVL3 (GOWN DISPOSABLE) IMPLANT
GOWN STRL REUS W/ TWL XL LVL3 (GOWN DISPOSABLE) ×2 IMPLANT
GOWN STRL REUS W/TWL 2XL LVL3 (GOWN DISPOSABLE) IMPLANT
GOWN STRL REUS W/TWL LRG LVL3 (GOWN DISPOSABLE)
GOWN STRL REUS W/TWL XL LVL3 (GOWN DISPOSABLE) ×2
GRAFT BNE MATRIX VG FRMBL MD 5 (Bone Implant) IMPLANT
GRAFT BONE PROTEIOS LRG 5CC (Orthopedic Implant) ×1 IMPLANT
KIT BASIN OR (CUSTOM PROCEDURE TRAY) ×2 IMPLANT
KIT TURNOVER KIT B (KITS) ×2 IMPLANT
NDL HYPO 25X1 1.5 SAFETY (NEEDLE) ×1 IMPLANT
NEEDLE HYPO 25X1 1.5 SAFETY (NEEDLE) ×2 IMPLANT
NS IRRIG 1000ML POUR BTL (IV SOLUTION) ×2 IMPLANT
OIL CARTRIDGE MAESTRO DRILL (MISCELLANEOUS) ×2
PACK LAMINECTOMY NEURO (CUSTOM PROCEDURE TRAY) ×2 IMPLANT
PAD ARMBOARD 7.5X6 YLW CONV (MISCELLANEOUS) ×6 IMPLANT
ROD CREO 45MM SPINAL (Rod) ×2 IMPLANT
SCREW PA THRD CREO TULIP 5.5X4 (Head) ×1 IMPLANT
SHAFT CREO 30MM (Neuro Prosthesis/Implant) ×4 IMPLANT
SPIKE FLUID TRANSFER (MISCELLANEOUS) ×2 IMPLANT
SPONGE SURGIFOAM ABS GEL 100 (HEMOSTASIS) ×2 IMPLANT
SPONGE T-LAP 4X18 ~~LOC~~+RFID (SPONGE) IMPLANT
STRIP CLOSURE SKIN 1/2X4 (GAUZE/BANDAGES/DRESSINGS) ×4 IMPLANT
SUT VIC AB 0 CT1 18XCR BRD8 (SUTURE) ×2 IMPLANT
SUT VIC AB 0 CT1 8-18 (SUTURE) ×2
SUT VIC AB 2-0 CT1 18 (SUTURE) ×2 IMPLANT
SUT VIC AB 4-0 PS2 27 (SUTURE) ×2 IMPLANT
TAP BONE CORT 5.0/4.0 (BIT) ×1 IMPLANT
TOWEL GREEN STERILE (TOWEL DISPOSABLE) ×2 IMPLANT
TOWEL GREEN STERILE FF (TOWEL DISPOSABLE) ×2 IMPLANT
TRAY FOLEY MTR SLVR 16FR STAT (SET/KITS/TRAYS/PACK) ×2 IMPLANT
WATER STERILE IRR 1000ML POUR (IV SOLUTION) ×2 IMPLANT

## 2021-08-31 NOTE — Transfer of Care (Signed)
Immediate Anesthesia Transfer of Care Note  Patient: Joe Morris  Procedure(s) Performed: Left - Lumbar five-Sacral one Transforaminal Lumbar Interbody Fusion  - Posterior Lateral and Interbody fusion (Left: Back)  Patient Location: PACU  Anesthesia Type:General  Level of Consciousness: drowsy, patient cooperative and responds to stimulation  Airway & Oxygen Therapy: Patient Spontanous Breathing and Patient connected to face mask oxygen  Post-op Assessment: Report given to RN, Post -op Vital signs reviewed and stable and Patient moving all extremities X 4  Post vital signs: Reviewed and stable  Last Vitals:  Vitals Value Taken Time  BP    Temp    Pulse 112   Resp 12   SpO2 100     Last Pain:  Vitals:   08/31/21 0911  TempSrc:   PainSc: 7          Complications: No notable events documented.

## 2021-09-01 ENCOUNTER — Encounter (HOSPITAL_COMMUNITY): Payer: Self-pay | Admitting: Neurosurgery

## 2021-09-01 DIAGNOSIS — M5127 Other intervertebral disc displacement, lumbosacral region: Secondary | ICD-10-CM | POA: Diagnosis not present

## 2021-09-01 MED ORDER — OXYCODONE-ACETAMINOPHEN 5-325 MG PO TABS
1.0000 | ORAL_TABLET | ORAL | 0 refills | Status: AC | PRN
Start: 2021-09-01 — End: ?

## 2021-09-01 MED FILL — Thrombin For Soln 20000 Unit: CUTANEOUS | Qty: 1 | Status: AC

## 2021-12-03 ENCOUNTER — Ambulatory Visit: Payer: Self-pay | Admitting: Podiatry

## 2021-12-07 ENCOUNTER — Encounter: Payer: Self-pay | Admitting: Podiatry

## 2021-12-07 ENCOUNTER — Ambulatory Visit: Payer: No Typology Code available for payment source | Admitting: Podiatry

## 2021-12-07 ENCOUNTER — Ambulatory Visit (INDEPENDENT_AMBULATORY_CARE_PROVIDER_SITE_OTHER): Payer: No Typology Code available for payment source

## 2021-12-07 DIAGNOSIS — M62462 Contracture of muscle, left lower leg: Secondary | ICD-10-CM | POA: Diagnosis not present

## 2021-12-07 DIAGNOSIS — M62461 Contracture of muscle, right lower leg: Secondary | ICD-10-CM | POA: Diagnosis not present

## 2021-12-07 DIAGNOSIS — M722 Plantar fascial fibromatosis: Secondary | ICD-10-CM

## 2021-12-07 NOTE — Patient Instructions (Signed)

## 2021-12-10 NOTE — Progress Notes (Signed)
  Subjective:  Patient ID: Joe Morris, male    DOB: [birth year],  MRN: [id number]  Chief Complaint  Patient presents with   Foot Pain    "I have pain in both heels.  On my left foot, I have pain in my big toe and the middle three toes." N - heel pain L - bilateral D - 4 yrs O - intermitten, gotten worse C - sore, sharp pains, tender A - standing long periods of time T - inserts  N - toes are painful L - 1,2,3,4 toes D - couple of years O - off and on  C - sharp pain A - none T - none    41 y.o. male presents with the above complaint. History confirmed with patient.   Objective:  Physical Exam: warm, good capillary refill, no trophic changes or ulcerative lesions, normal DP and PT pulses, normal sensory exam, and significant gastrocnemius equinus bilaterally.  He has hallux valgus that is mildly tender over the medial eminence, good range of motion that is smooth and pain-free   Radiograph showed no acute fracture no major degenerative changes, no significant heel spur Assessment:   1. Plantar fasciitis, bilateral   2. Gastrocnemius equinus of left lower extremity   3. Gastrocnemius equinus of right lower extremity      Plan:  Patient was evaluated and treated and all questions answered.  Discussed the etiology and treatment options for plantar fasciitis including stretching, formal physical therapy, supportive shoegears such as a running shoe or sneaker, pre fabricated orthoses, injection therapy, and oral medications. We also discussed the role of surgical treatment of this for patients who do not improve after exhausting non-surgical treatment options.   -XR reviewed with patient -Educated patient on stretching and icing of the affected limb -Night splint dispensed -I suspect most of this is from his gastrocnemius equinus.  We also discussed supporting the pedal longitudinal arch with a custom molded orthoses.  He thinks he is able to get these through the [place].  He  will contact them about getting these made.   Return in about 6 weeks (around 01/18/2022) for recheck plantar fasciitis.

## 2022-01-18 ENCOUNTER — Ambulatory Visit (INDEPENDENT_AMBULATORY_CARE_PROVIDER_SITE_OTHER): Payer: No Typology Code available for payment source | Admitting: Podiatry

## 2022-01-18 DIAGNOSIS — M62462 Contracture of muscle, left lower leg: Secondary | ICD-10-CM

## 2022-01-18 DIAGNOSIS — M722 Plantar fascial fibromatosis: Secondary | ICD-10-CM | POA: Diagnosis not present

## 2022-01-18 DIAGNOSIS — M62461 Contracture of muscle, right lower leg: Secondary | ICD-10-CM

## 2022-01-18 NOTE — Progress Notes (Signed)
  Subjective:  Patient ID: Joe Morris, male    DOB: Jul 29, 1980,  MRN: [id number]  Chief Complaint  Patient presents with   Plantar Fasciitis    6 week follow up,  recheck plantar fasciitis.    41 y.o. male presents with the above complaint. History confirmed with patient.  Has had a little improvement but by the end of the day at work it is quite painful for him.  Still is not able to take NSAIDs.  Objective:  Physical Exam: warm, good capillary refill, no trophic changes or ulcerative lesions, normal DP and PT pulses, normal sensory exam, and significant gastrocnemius equinus bilaterally.  He has hallux valgus that is mildly tender over the medial eminence, good range of motion that is smooth and pain-free.  Sharp pain on palpation today to insertion of medial band of plantar fascia bilaterally on calcaneus   Radiograph showed no acute fracture no major degenerative changes, no significant heel spur Assessment:   1. Plantar fasciitis, bilateral   2. Gastrocnemius equinus of right lower extremity   3. Gastrocnemius equinus of left lower extremity      Plan:  Patient was evaluated and treated and all questions answered.  Discussed the etiology and treatment options for plantar fasciitis including stretching, formal physical therapy, supportive shoegears such as a running shoe or sneaker, pre fabricated orthoses, injection therapy, and oral medications. We also discussed the role of surgical treatment of this for patients who do not improve after exhausting non-surgical treatment options.   -Continue using night splint, his equinus is still a significant factor here -He is in the process of getting orthotics made through the [place] -He likely would benefit from physical therapy especially dry needling and deep tissue manipulation.  Referral was placed [facility] at [facility] -We also discussed the option of corticosteroid injection which she elected for today.  This was completed as noted  below.   After sterile prep with povidone-iodine solution and alcohol, the bilateral heel was injected with 0.5cc 2% xylocaine plain, 0.5cc 0.5% marcaine plain, '5mg'$  triamcinolone acetonide, and '2mg'$  dexamethasone was injected along the medial plantar fascia at the insertion on the plantar calcaneus. The patient tolerated the procedure well without complication.    Return in about 6 weeks (around 03/01/2022) for recheck plantar fasciitis.

## 2022-01-21 ENCOUNTER — Inpatient Hospital Stay: Payer: No Typology Code available for payment source | Attending: Hematology

## 2022-01-21 ENCOUNTER — Inpatient Hospital Stay (HOSPITAL_BASED_OUTPATIENT_CLINIC_OR_DEPARTMENT_OTHER): Payer: No Typology Code available for payment source | Admitting: Hematology

## 2022-01-21 ENCOUNTER — Encounter: Payer: Self-pay | Admitting: Hematology

## 2022-01-21 VITALS — BP 138/86 | HR 52 | Temp 98.2°F | Resp 15 | Ht 67.0 in | Wt 179.6 lb

## 2022-01-21 DIAGNOSIS — C49A3 Gastrointestinal stromal tumor of small intestine: Secondary | ICD-10-CM | POA: Diagnosis not present

## 2022-01-21 DIAGNOSIS — C49A0 Gastrointestinal stromal tumor, unspecified site: Secondary | ICD-10-CM

## 2022-01-21 LAB — CBC WITH DIFFERENTIAL (CANCER CENTER ONLY)
Abs Immature Granulocytes: 0.09 10*3/uL — ABNORMAL HIGH (ref 0.00–0.07)
Basophils Absolute: 0.1 10*3/uL (ref 0.0–0.1)
Basophils Relative: 1 %
Eosinophils Absolute: 0.1 10*3/uL (ref 0.0–0.5)
Eosinophils Relative: 2 %
HCT: 40 % (ref 39.0–52.0)
Hemoglobin: 13.8 g/dL (ref 13.0–17.0)
Immature Granulocytes: 1 %
Lymphocytes Relative: 29 %
Lymphs Abs: 1.8 10*3/uL (ref 0.7–4.0)
MCH: 32.7 pg (ref 26.0–34.0)
MCHC: 34.5 g/dL (ref 30.0–36.0)
MCV: 94.8 fL (ref 80.0–100.0)
Monocytes Absolute: 0.6 10*3/uL (ref 0.1–1.0)
Monocytes Relative: 10 %
Neutro Abs: 3.7 10*3/uL (ref 1.7–7.7)
Neutrophils Relative %: 57 %
Platelet Count: 263 10*3/uL (ref 150–400)
RBC: 4.22 MIL/uL (ref 4.22–5.81)
RDW: 12.9 % (ref 11.5–15.5)
WBC Count: 6.4 10*3/uL (ref 4.0–10.5)
nRBC: 0 % (ref 0.0–0.2)

## 2022-01-21 LAB — CMP (CANCER CENTER ONLY)
ALT: 20 U/L (ref 0–44)
AST: 22 U/L (ref 15–41)
Albumin: 4.6 g/dL (ref 3.5–5.0)
Alkaline Phosphatase: 65 U/L (ref 38–126)
Anion gap: 5 (ref 5–15)
BUN: 14 mg/dL (ref 6–20)
CO2: 31 mmol/L (ref 22–32)
Calcium: 9.8 mg/dL (ref 8.9–10.3)
Chloride: 105 mmol/L (ref 98–111)
Creatinine: 1.22 mg/dL (ref 0.61–1.24)
GFR, Estimated: >60 mL/min (ref >60)
Glucose, Bld: 77 mg/dL (ref 70–99)
Potassium: 3.9 mmol/L (ref 3.5–5.1)
Sodium: 141 mmol/L (ref 135–145)
Total Bilirubin: 0.6 mg/dL (ref 0.3–1.2)
Total Protein: 6.9 g/dL (ref 6.5–8.1)

## 2022-01-21 NOTE — Progress Notes (Signed)
[facility]   Telephone:(336) [phone] [phone]) [phone]   Clinic Follow up Note   Patient Care Team: Lennie Odor, [place] as PCP - General (Nurse Practitioner) Elmarie Shiley, MD as Consulting Physician (Nephrology) Greer Pickerel, MD as Consulting Physician (General Surgery) [name], MD as Consulting Physician (Hematology)  Date of Service:  01/21/2022  CHIEF COMPLAINT: f/u of GIST of small intestine  CURRENT THERAPY:  Surveillance  ASSESSMENT & PLAN:  Joe Morris is a 42 y.o. male with   1. GIST of small intestine, pT1NxM0, stage I, low grade, with perforation  -s/p laparoscopic surgery on 07/15/17 by Dr. Redmond Pulling. Pathology showed 1.7 cm GIST of proximal jejunum. -treated with Gleevec 09/2017 - 05/05/20, discontinued due to intolerable GI side effects.  -surveillance CT AP on 04/20/21 showed some wall thickening along anastomotic staple line in area of prior tumor. He underwent pushed endoscopy which showed no evidence of local recurrence.  -he is clinically doing well from a GIST standpoint. Labs reviewed, WNL. Physical exam was unremarkable. There is no clinical concern for recurrence. -he reports his insurance will be changing at the start of the year and would prefer to have his scan completed before then. I will order today.   2. GERD -he is on protonix '40mg'$  BID -he underwent small bowel enteroscopy on 06/04/21 with Dr. Rush Landmark. This was overall benign, no gross lesions, and pathology was also benign.   2. 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 -he has been on insurance through his job with [employer], but he is planning to move to coverage by the [facility] in 03/2022.     PLAN:  -restaging CT AP to be done before the end of the year -lab and f/u in 12 months   No problem-specific Assessment & Plan notes found for this encounter.   INTERVAL HISTORY:  Joe Morris is here for a follow up of GIST. He was last seen by me on 07/21/21. He presents to  the clinic alone. He reports he is doing well overall. He tells me he underwent spinal fusion surgery over the summer, which he notes is his third surgery to the area. His only other complaint is sciatic pain. Otherwise, he is doing well. He endorses taking protonix for GERD, denies any other GI symptoms.    All other systems were reviewed with the patient and are negative.  MEDICAL HISTORY:  Past Medical History:  Diagnosis Date   Anemia 07/2014   Anxiety    Gastrointestinal hemorrhage with melena    /notes 07/26/2014 [facility])   GERD (gastroesophageal reflux disease)    gist dx'd 07/2017   Hypertension    Intra-abdominal abscess ([place])    [name] 07/13/2017    SURGICAL HISTORY: Past Surgical History:  Procedure Laterality Date   BACK SURGERY     BIOPSY  06/04/2021   Procedure: BIOPSY;  Surgeon: Irving Copas., MD;  Location: [facility] ENDOSCOPY;  Service: Gastroenterology;;   COLONOSCOPY WITH ESOPHAGOGASTRODUODENOSCOPY (EGD)  07/2014   "@ [facility]"   ENTEROSCOPY N/A 06/04/2021   Procedure: ENTEROSCOPY;  Surgeon: Irving Copas., MD;  Location: [facility] ENDOSCOPY;  Service: Gastroenterology;  Laterality: N/A;   IR FLUORO GUIDE CV LINE RIGHT  07/25/2017   IR US GUIDE VASC ACCESS RIGHT  07/25/2017   LAPAROSCOPY N/A 07/15/2017   Procedure: LAPAROSCOPY DIAGNOSTIC;  Surgeon: Greer Pickerel, MD;  Location: [place];  Service: General;  Laterality: N/A;   LAPAROTOMY N/A 07/15/2017   Procedure: EXPLORATORY LAPAROTOMY PROXIMAL  SMALL BOWEL RESECTION, DRAINAGE OF ABDOMINAL ABSCESS;  Surgeon: Greer Pickerel, MD;  Location: [place];  Service: General;  Laterality: N/A;   TRANSFORAMINAL LUMBAR INTERBODY FUSION (TLIF) WITH PEDICLE SCREW FIXATION 1 LEVEL Left 08/31/2021   Procedure: Left - Lumbar five-Sacral one Transforaminal Lumbar Interbody Fusion  - Posterior Lateral and Interbody fusion;  Surgeon: Kary Kos, MD;  Location: [place];  Service: Neurosurgery;  Laterality: Left;    I have reviewed the social  history and family history with the patient and they are unchanged from previous note.  ALLERGIES:  is allergic to vancomycin and levaquin [levofloxacin].  MEDICATIONS:  Current Outpatient Medications  Medication Sig Dispense Refill   buPROPion (WELLBUTRIN XL) 150 MG 24 hr tablet 1 tablet in the morning Orally Once a day 90 days (Patient taking differently: Take 150 mg by mouth daily.) 90 tablet 0   busPIRone (BUSPAR) 10 MG tablet Take 10 mg by mouth 3 (three) times daily.     Cholecalciferol (VITAMIN D-3) 125 MCG (5000 UT) TABS Take 5,000 Units by mouth daily.     COVID-19 mRNA bivalent vaccine, Pfizer, (PFIZER COVID-19 VAC BIVALENT) injection Inject into the muscle. 0.3 mL 0   cyclobenzaprine (FLEXERIL) 10 MG tablet Take 10 mg by mouth 2 (two) times daily. (Patient not taking: Reported on 12/07/2021)     diazepam (VALIUM) 5 MG tablet Take 1 tablet (5 mg total) by mouth every 12 (twelve) hours as needed for muscle spasms. (Patient not taking: Reported on 08/25/2021) 10 tablet 0   diclofenac Sodium (VOLTAREN) 1 % GEL Apply 4 g topically 4 (four) times daily. 100 g 0   gabapentin (NEURONTIN) 300 MG capsule Take 300 mg by mouth 3 (three) times daily. (Patient not taking: Reported on 12/07/2021)     Multiple Vitamins-Minerals (MULTIVITAMIN WITH MINERALS) tablet Take 1 tablet by mouth daily.     oxyCODONE-acetaminophen (PERCOCET/ROXICET) 5-325 MG tablet Take 1-2 tablets by mouth every 4 (four) hours as needed for moderate pain or severe pain. (Patient not taking: Reported on 12/07/2021) 30 tablet 0   pantoprazole (PROTONIX) 40 MG tablet Take 1 tablet (40 mg total) by mouth 2 (two) times daily before a meal. (Patient taking differently: Take 40 mg by mouth every morning.) 60 tablet 12   traZODone (DESYREL) 50 MG tablet Take 50 mg by mouth at bedtime.     No current facility-administered medications for this visit.    PHYSICAL EXAMINATION: ECOG PERFORMANCE STATUS: 0 - Asymptomatic  Vitals:    01/21/22 0811  BP: 138/86  Pulse: (!) 52  Resp: 15  Temp: 98.2 F (36.8 C)  SpO2: 100%   Wt Readings from Last 3 Encounters:  01/21/22 179 lb 9.6 oz (81.5 kg)  08/31/21 170 lb (77.1 kg)  08/27/21 170 lb 4.8 oz (77.2 kg)     GENERAL:alert, no distress and comfortable SKIN: skin color, texture, turgor are normal, no rashes or significant lesions EYES: normal, Conjunctiva are pink and non-injected, sclera clear  NECK: supple, thyroid normal size, non-tender, without nodularity LYMPH:  no palpable lymphadenopathy in the cervical, axillary  LUNGS: clear to auscultation and percussion with normal breathing effort HEART: regular rate & rhythm and no murmurs and no lower extremity edema ABDOMEN:abdomen soft, non-tender and normal bowel sounds Musculoskeletal:no cyanosis of digits and no clubbing  NEURO: alert & oriented x 3 with fluent speech, no focal motor/sensory deficits  LABORATORY DATA:  I have reviewed the data as listed    Latest Ref Rng & Units 01/21/2022  7:33 AM 08/27/2021    2:45 PM 07/21/2021    9:12 AM  CBC  WBC 4.0 - 10.5 K/uL 6.4  7.4  5.9   Hemoglobin 13.0 - 17.0 g/dL 13.8  14.7  14.5   Hematocrit 39.0 - 52.0 % 40.0  42.1  40.7   Platelets 150 - 400 K/uL 263  292  261         Latest Ref Rng & Units 01/21/2022    7:33 AM 08/27/2021    2:45 PM 07/21/2021    9:12 AM  CMP  Glucose 70 - 99 mg/dL 77  88  99   BUN 6 - 20 mg/dL '14  13  16   '$ Creatinine 0.61 - 1.24 mg/dL 1.22  1.15  1.26   Sodium 135 - 145 mmol/L 141  143  140   Potassium 3.5 - 5.1 mmol/L 3.9  4.5  4.6   Chloride 98 - 111 mmol/L 105  103  104   CO2 22 - 32 mmol/L '31  30  31   '$ Calcium 8.9 - 10.3 mg/dL 9.8  9.2  9.7   Total Protein 6.5 - 8.1 g/dL 6.9   6.9   Total Bilirubin 0.3 - 1.2 mg/dL 0.6   0.7   Alkaline Phos 38 - 126 U/L 65   65   AST 15 - 41 U/L 22   23   ALT 0 - 44 U/L 20   18       RADIOGRAPHIC STUDIES: I have personally reviewed the radiological images as listed and agreed with  the findings in the report. No results found.    Orders Placed This Encounter  Procedures   CT ABDOMEN PELVIS W CONTRAST    Standing Status:   Future    Standing Expiration Date:   01/22/2023    Order Specific Question:   If indicated for the ordered procedure, I authorize the administration of contrast media per Radiology protocol    Answer:   Yes    Order Specific Question:   Preferred imaging location?    Answer:    [facility]    Order Specific Question:   Release to patient    Answer:   Immediate    Order Specific Question:   Is Oral Contrast requested for this exam?    Answer:   Yes, Per Radiology protocol   All questions were answered. The patient knows to call the clinic with any problems, questions or concerns. No barriers to learning was detected. The total time spent in the appointment was 25 minutes.     [name], MD 01/21/2022   I, Wilburn Mylar, am acting as scribe for [name], MD.   I have reviewed the above documentation for accuracy and completeness, and I agree with the above.

## 2022-02-11 ENCOUNTER — Encounter: Payer: Self-pay | Admitting: Physical Therapy

## 2022-02-11 ENCOUNTER — Ambulatory Visit: Payer: No Typology Code available for payment source | Attending: Podiatry | Admitting: Physical Therapy

## 2022-02-11 DIAGNOSIS — M62461 Contracture of muscle, right lower leg: Secondary | ICD-10-CM | POA: Insufficient documentation

## 2022-02-11 DIAGNOSIS — M25671 Stiffness of right ankle, not elsewhere classified: Secondary | ICD-10-CM | POA: Insufficient documentation

## 2022-02-11 DIAGNOSIS — M62462 Contracture of muscle, left lower leg: Secondary | ICD-10-CM | POA: Diagnosis not present

## 2022-02-11 DIAGNOSIS — M722 Plantar fascial fibromatosis: Secondary | ICD-10-CM | POA: Insufficient documentation

## 2022-02-11 DIAGNOSIS — M25672 Stiffness of left ankle, not elsewhere classified: Secondary | ICD-10-CM | POA: Diagnosis present
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
## 2022-02-15 ENCOUNTER — Ambulatory Visit
Admission: RE | Admit: 2022-02-15 | Discharge: 2022-02-15 | Disposition: A | Discharge status: Home / Self Care | Payer: No Typology Code available for payment source | Source: Ambulatory Visit | Attending: Hematology | Admitting: Hematology

## 2022-02-15 DIAGNOSIS — C49A3 Gastrointestinal stromal tumor of small intestine: Secondary | ICD-10-CM | POA: Insufficient documentation

## 2022-02-15 MED ORDER — IOHEXOL 300 MG/ML  SOLN
100.0000 mL | Freq: Once | INTRAMUSCULAR | Status: AC | PRN
  Administered 2022-02-15: 100 mL via INTRAVENOUS

## 2022-02-16 ENCOUNTER — Encounter: Payer: Self-pay | Admitting: Hematology

## 2022-02-17 ENCOUNTER — Ambulatory Visit: Payer: No Typology Code available for payment source | Admitting: Physical Therapy

## 2022-02-19 ENCOUNTER — Encounter: Payer: Self-pay | Admitting: Physical Therapy

## 2022-02-19 ENCOUNTER — Ambulatory Visit: Payer: No Typology Code available for payment source | Admitting: Physical Therapy

## 2022-02-19 DIAGNOSIS — M25672 Stiffness of left ankle, not elsewhere classified: Secondary | ICD-10-CM

## 2022-02-19 DIAGNOSIS — M722 Plantar fascial fibromatosis: Secondary | ICD-10-CM | POA: Diagnosis not present
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
## 2022-02-22 ENCOUNTER — Telehealth: Payer: Self-pay

## 2022-02-22 NOTE — Telephone Encounter (Signed)
Faxed copy of last office notes and CT Scan results as per request to [employer].

## 2022-02-23 ENCOUNTER — Encounter: Payer: No Typology Code available for payment source | Admitting: Physical Therapy

## 2022-02-26 ENCOUNTER — Encounter: Payer: No Typology Code available for payment source | Admitting: Physical Therapy

## 2022-03-02 ENCOUNTER — Ambulatory Visit: Payer: No Typology Code available for payment source

## 2022-03-02 DIAGNOSIS — M25671 Stiffness of right ankle, not elsewhere classified: Secondary | ICD-10-CM

## 2022-03-02 DIAGNOSIS — M722 Plantar fascial fibromatosis: Secondary | ICD-10-CM | POA: Diagnosis not present

## 2022-03-02 DIAGNOSIS — M25672 Stiffness of left ankle, not elsewhere classified: Secondary | ICD-10-CM

## 2022-03-02 NOTE — Therapy (Signed)
OUTPATIENT PHYSICAL THERAPY TREATMENT   Patient Name: Joe Morris MRN: [id number] DOB:04/23/1980, 41 y.o., male Today's Date: 03/02/2022   PT End of Session - 03/02/22 0749     Visit Number 3    Number of Visits 17    Date for PT Re-Evaluation 04/08/22    Authorization Type [employer]    Progress Note Due on Visit 10    PT Start Time 0745    PT Stop Time 0825    PT Time Calculation (min) 40 min    Activity Tolerance Patient tolerated treatment well;No increased pain    Behavior During Therapy [facility] for tasks assessed/performed              Past Medical History:  Diagnosis Date   Anemia 07/2014   Anxiety    Gastrointestinal hemorrhage with melena    /notes 07/26/2014 [facility])   GERD (gastroesophageal reflux disease)    gist dx'd 07/2017   Hypertension    Intra-abdominal abscess ([place])    [name] 07/13/2017   Past Surgical History:  Procedure Laterality Date   BACK SURGERY     BIOPSY  06/04/2021   Procedure: BIOPSY;  Surgeon: Irving Copas., MD;  Location: [facility] ENDOSCOPY;  Service: Gastroenterology;;   COLONOSCOPY WITH ESOPHAGOGASTRODUODENOSCOPY (EGD)  07/2014   "@ [facility]"   ENTEROSCOPY N/A 06/04/2021   Procedure: ENTEROSCOPY;  Surgeon: Irving Copas., MD;  Location: [place];  Service: Gastroenterology;  Laterality: N/A;   IR FLUORO GUIDE CV LINE RIGHT  07/25/2017   IR US GUIDE VASC ACCESS RIGHT  07/25/2017   LAPAROSCOPY N/A 07/15/2017   Procedure: LAPAROSCOPY DIAGNOSTIC;  Surgeon: Greer Pickerel, MD;  Location: [place];  Service: General;  Laterality: N/A;   LAPAROTOMY N/A 07/15/2017   Procedure: EXPLORATORY LAPAROTOMY PROXIMAL SMALL BOWEL RESECTION, DRAINAGE OF ABDOMINAL ABSCESS;  Surgeon: Greer Pickerel, MD;  Location: [place];  Service: General;  Laterality: N/A;   TRANSFORAMINAL LUMBAR INTERBODY FUSION (TLIF) WITH PEDICLE SCREW FIXATION 1 LEVEL Left 08/31/2021   Procedure: Left - Lumbar five-Sacral one Transforaminal Lumbar Interbody Fusion  -  Posterior Lateral and Interbody fusion;  Surgeon: Kary Kos, MD;  Location: [place];  Service: Neurosurgery;  Laterality: Left;   Patient Active Problem List   Diagnosis Date Noted   HNP (herniated nucleus pulposus), lumbar 08/31/2021   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 ([place]) 04/02/2018   Acute kidney injury ([place]) 07/17/2017   Perforation of small intestine ([place]) 07/17/2017   Intra-abdominal abscess ([place]) 07/13/2017   Melena 07/16/2014    PCP: Lennie Odor, PA  REFERRING PROVIDER: Criselda Peaches, DPM  REFERRING DIAG: M72.2 (ICD-10-CM) - Plantar fasciitis, bilateral M62.461 (ICD-10-CM) - Gastrocnemius equinus of right lower extremity M62.462 (ICD-10-CM) - Gastrocnemius equinus of left lower extremity  THERAPY DIAG:  Plantar fasciitis, bilateral  Stiffness of left ankle, not elsewhere classified  Stiffness of right ankle, not elsewhere classified  Rationale for Evaluation and Treatment Rehabilitation  ONSET DATE: 2008    SUBJECTIVE:   SUBJECTIVE STATEMENT: Pt reports his stretches and rolling his feet in the am have been helping his pain. Compliance with HEP. Has started using OTC shoe inserts.   PERTINENT HISTORY: Patient is a 41 year old male presenting with bilateral heel pain.  Pt lives with girlfriend and kids. 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 Pt was off of work for 3 months and was only cleared to walk during that time. Pt reports heel pain got worse during this time and substantially worse after returning to work full time. Pt reports pain is worst first thing in the morning and at the end of work shifts. Pt had injections done and these have helped decrease pain greatly.  PAIN:  Are you having pain? yes, 3/10 bilat feet   PRECAUTIONS: None  WEIGHT BEARING  RESTRICTIONS: No  FALLS:  Has patient fallen in last 6 months? No   PATIENT GOALS: Trail onewheel riding, running, walking limited by pain    OBJECTIVE:   PATIENT SURVEYS:  FOTO 54/71   TODAY'S TREATMENT                                                                          DATE: 03/02/2022  Nustep Seat 8  L4 47mns with straps tightened for increased DF demand Straight knee calf stretch at stairs 2x30sec bilat, 2x30sec single Bent knee calf stretch at stairs (lunge) 2x30sec singles Quadruped gross toe flexion/ stretch 2x30sec Back squat, heels on 2x4 1x12 @ 0lb, 1x12 @ 15lb bar only  Seated ankle DF 5lb AW 1x15 Seated ankle DF 10lb KB 1x15 bilat Leaning on wall ankle DF  ART to bilat calves x5 minutes     PATIENT EDUCATION:  Education details: self assessment stance width at work, avoid prolonged wide stance   Person educated: Patient Education method: EConsulting civil engineer DMedia planner TCorporate treasurercues, and Verbal cues Education comprehension: verbalized understanding, returned demonstration, verbal cues required, and tactile cues required  HOME EXERCISE PROGRAM: Long sitting plantar fascia and calf stretch with towel bilateral LE, 30 second holds, 2-3 holds, 2-3 sets per day Standing calf stretch pushing against door frame bilaterally, 30 second holds, 2-3 holds, 2-3 sets per day Standing on tennis ball soft tissue mobilization, 10 rolls per side, 2 sets bilaterally, 2 times daily  ASSESSMENT:  CLINICAL IMPRESSION: Continued with posterior compartment and plantar soft tissue lengthening, anterior compartment strengthening. Session tolerated very well, no increased pain at EOS.   OBJECTIVE IMPAIRMENTS: decreased activity tolerance, decreased mobility, difficulty walking, decreased ROM, hypomobility, impaired sensation, and pain.   ACTIVITY LIMITATIONS: lifting, bending, standing, and squatting  PARTICIPATION LIMITATIONS: community activity, occupation, and yard  work  PERSONAL FACTORS: Age, Past/current experiences, Time since onset of injury/illness/exacerbation, and spinal fusion  are also affecting patient's functional outcome.   REHAB POTENTIAL: Good  CLINICAL DECISION MAKING: Evolving/moderate complexity  EVALUATION COMPLEXITY: Moderate   GOALS: Goals reviewed with patient? No  SHORT TERM GOALS: Target date: 03/11/2021 Pt will be independent with HEP in order to improve ROM and pain limitations in order to decrease discomort and improve function at home and work.  Baseline: HEP given 02/11/2022 Goal status: INITIAL    LONG TERM GOALS: Target date: 04/08/2022  Patient will increase FOTO score to 71 to demonstrate predicted increase in functional mobility to return to PLOF.  Baseline: 54 Goal status: INITIAL  2.  Pt will increase DF ROM to 20 degrees bilaterally for increased performance with ADL. Baseline:  Goal status: INITIAL  3.  Pt will report a decrease in  NPRS level of pain to at least a 4/10 for improved quality of life.  Baseline: 7/10 Goal status: INITIAL  4.  Pt will report ability to skateboard while walking dog with decreased level of pain in lead foot for increased performance in leisure activities. Baseline: pt unable to perform activity without pain Goal status: INITIAL    PLAN:  PT FREQUENCY: 1-2x/week  PT DURATION: 8 weeks  PLANNED INTERVENTIONS: Therapeutic exercises, Therapeutic activity, Neuromuscular re-education, Balance training, Gait training, Patient/Family education, Self Care, Joint mobilization, and Manual therapy  PLAN FOR NEXT SESSION:  FU on quadruped toe flexion stretch at home   7:53 AM, 03/02/22 Etta Grandchild, PT, DPT Physical Therapist - [place] [phone] (Office)

## 2022-03-03 ENCOUNTER — Ambulatory Visit (INDEPENDENT_AMBULATORY_CARE_PROVIDER_SITE_OTHER): Payer: No Typology Code available for payment source | Admitting: Podiatry

## 2022-03-03 DIAGNOSIS — M722 Plantar fascial fibromatosis: Secondary | ICD-10-CM

## 2022-03-04 ENCOUNTER — Encounter: Payer: No Typology Code available for payment source | Admitting: Physical Therapy

## 2022-03-06 NOTE — Progress Notes (Signed)
  Subjective:  Patient ID: Joe Morris, male    DOB: 1980/09/08,  MRN: [id number]  Chief Complaint  Patient presents with   Plantar Fasciitis    6 week follow up both feet - doing better    41 y.o. male presents with the above complaint. History confirmed with patient.  Doing much better with physical therapy  Objective:  Physical Exam: warm, good capillary refill, no trophic changes or ulcerative lesions, normal DP and PT pulses, normal sensory exam, and significant gastrocnemius equinus bilaterally.  He has hallux valgus that is mildly tender over the medial eminence, good range of motion that is smooth and pain-free.  Minimal pain to palpation today of insertion of medial band of plantar fascia   Radiograph showed no acute fracture no major degenerative changes, no significant heel spur Assessment:   No diagnosis found.    Plan:  Patient was evaluated and treated and all questions answered.  Discussed the etiology and treatment options for plantar fasciitis including stretching, formal physical therapy, supportive shoegears such as a running shoe or sneaker, pre fabricated orthoses, injection therapy, and oral medications. We also discussed the role of surgical treatment of this for patients who do not improve after exhausting non-surgical treatment options.  Overall doing much better.  He will continue his home therapy.  He is changing his insurance to [employer], and he will let me know if he would like to continue physical therapy at another provider such as [facility] or any provider that does community care with the [place].  He will follow-up with me as needed at this point   Return if symptoms worsen or fail to improve.

## 2022-03-09 ENCOUNTER — Encounter: Payer: Self-pay | Admitting: Physical Therapy

## 2022-03-11 ENCOUNTER — Encounter: Payer: Self-pay | Admitting: Physical Therapy

## 2022-03-16 ENCOUNTER — Encounter: Payer: Self-pay | Admitting: Physical Therapy

## 2022-03-18 ENCOUNTER — Encounter: Payer: Self-pay | Admitting: Physical Therapy

## 2023-01-21 ENCOUNTER — Other Ambulatory Visit: Payer: Self-pay

## 2023-01-21 DIAGNOSIS — C49A3 Gastrointestinal stromal tumor of small intestine: Secondary | ICD-10-CM

## 2023-01-21 NOTE — Progress Notes (Deleted)
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 Andrey Campanile. Pathology showed 1.7 cm GIST of proximal jejunum. -treated with Gleevec 09/2017 - 05/05/20, discontinued due to intolerable GI side effects.  -surveillance CT AP on 04/20/21 showed some wall thickening along anastomotic staple line in area of prior tumor. He underwent pushed endoscopy which showed no evidence of local recurrence.  -he is clinically doing well from a GIST standpoint. Labs reviewed, WNL. Physical exam was unremarkable. There is no clinical concern for recurrence. -restaging CT CAP done 02/16/2023 showed no evidence of lymphadenopathy or metastatic disease.      The patient understands the plans discussed today and is in agreement with them.  He knows to contact our office if he develops concerns prior to his next appointment.  I provided *** minutes of face-to-face time during this encounter and > 50% was spent counseling as documented under my assessment and plan.    Carlean Jews, NP  [facility] - A DEPT OF [facility] [address] [place] [address] Dept: [phone] Dept Fax: [phone]   No orders of the defined types were placed in this encounter.     CHIEF COMPLAINT:  CC: gastrointestinal stromal tumor (GIST) of small intestine   Current Treatment:  surveillance  INTERVAL HISTORY:  Juandiego is here today for repeat clinical assessment. Last saw Dr. Mosetta Putt 01/21/2022. Currently  on surveillance only. Had intolerable GI side effects on Gleevec.  Had restaging CT CAP done 02/15/2022. There was no evidence of lymphadenopathy or metastatic disease.  He denies fevers or chills. He denies pain. His appetite is good. His weight {Weight change:10426}.  I have reviewed the past medical history, past surgical history, social history and family history with the patient and they are unchanged from previous note.  ALLERGIES:  is allergic to vancomycin and levaquin [levofloxacin].  MEDICATIONS:  Current Outpatient Medications  Medication Sig Dispense Refill   buPROPion (WELLBUTRIN XL) 150 MG 24 hr tablet 1 tablet in the morning Orally Once a day 90 days (Patient taking differently: Take 150 mg by mouth daily.) 90 tablet 0   busPIRone (BUSPAR) 10 MG tablet Take 10 mg by mouth 3 (three) times daily.     Cholecalciferol (VITAMIN D-3) 125 MCG (5000 UT) TABS Take 5,000 Units by mouth daily.     COVID-19 mRNA bivalent vaccine, Pfizer, (PFIZER COVID-19 VAC BIVALENT) injection Inject into the muscle. 0.3 mL 0   cyclobenzaprine (FLEXERIL) 10 MG tablet Take 10 mg by mouth 2 (two) times daily. (Patient not taking: Reported on 12/07/2021)     diazepam (VALIUM) 5 MG tablet Take 1 tablet (5 mg total) by mouth every 12 (twelve) hours as needed for muscle spasms. (Patient not taking: Reported on 08/25/2021) 10 tablet 0   diclofenac Sodium (VOLTAREN) 1 % GEL Apply 4 g topically 4 (four) times daily. 100 g 0   gabapentin (NEURONTIN) 300 MG capsule Take 300 mg by mouth 3 (three) times daily. (Patient not taking: Reported on 12/07/2021)     Multiple Vitamins-Minerals (MULTIVITAMIN WITH  MINERALS) tablet Take 1 tablet by mouth daily.     oxyCODONE-acetaminophen (PERCOCET/ROXICET) 5-325 MG tablet Take 1-2 tablets by mouth every 4 (four) hours as needed for moderate pain or severe pain. (Patient not taking: Reported on 12/07/2021) 30 tablet 0   pantoprazole (PROTONIX) 40 MG tablet Take 1 tablet (40 mg total) by  mouth 2 (two) times daily before a meal. (Patient taking differently: Take 40 mg by mouth every morning.) 60 tablet 12   traZODone (DESYREL) 50 MG tablet Take 50 mg by mouth at bedtime.     No current facility-administered medications for this visit.    HISTORY OF PRESENT ILLNESS:   Oncology History   No history exists.      REVIEW OF SYSTEMS:   Constitutional: Denies fevers, chills or abnormal weight loss Eyes: Denies blurriness of vision Ears, nose, mouth, throat, and face: Denies mucositis or sore throat Respiratory: Denies cough, dyspnea or wheezes Cardiovascular: Denies palpitation, chest discomfort or lower extremity swelling Gastrointestinal:  Denies nausea, heartburn or change in bowel habits Skin: Denies abnormal skin rashes Lymphatics: Denies new lymphadenopathy or easy bruising Neurological:Denies numbness, tingling or new weaknesses Behavioral/Psych: Mood is stable, no new changes  All other systems were reviewed with the patient and are negative.   VITALS:  There were no vitals taken for this visit.  Wt Readings from Last 3 Encounters:  01/21/22 179 lb 9.6 oz (81.5 kg)  08/31/21 170 lb (77.1 kg)  08/27/21 170 lb 4.8 oz (77.2 kg)    There is no height or weight on file to calculate BMI.  Performance status (ECOG): {CHL ONC [id number]  PHYSICAL EXAM:   GENERAL:alert, no distress and comfortable SKIN: skin color, texture, turgor are normal, no rashes or significant lesions EYES: normal, Conjunctiva are pink and non-injected, sclera clear OROPHARYNX:no exudate, no erythema and lips, buccal mucosa, and tongue normal  NECK: supple, thyroid normal size, non-tender, without nodularity LYMPH:  no palpable lymphadenopathy in the cervical, axillary or inguinal LUNGS: clear to auscultation and percussion with normal breathing effort HEART: regular rate & rhythm and no murmurs and no lower extremity edema ABDOMEN:abdomen soft, non-tender and normal bowel  sounds Musculoskeletal:no cyanosis of digits and no clubbing  NEURO: alert & oriented x 3 with fluent speech, no focal motor/sensory deficits  LABORATORY DATA:  I have reviewed the data as listed    Component Value Date/Time   NA 141 01/21/2022 0733   K 3.9 01/21/2022 0733   CL 105 01/21/2022 0733   CO2 31 01/21/2022 0733   GLUCOSE 77 01/21/2022 0733   BUN 14 01/21/2022 0733   CREATININE 1.22 01/21/2022 0733   CALCIUM 9.8 01/21/2022 0733   PROT 6.9 01/21/2022 0733   ALBUMIN 4.6 01/21/2022 0733   AST 22 01/21/2022 0733   ALT 20 01/21/2022 0733   ALKPHOS 65 01/21/2022 0733   BILITOT 0.6 01/21/2022 0733   GFRNONAA >60 01/21/2022 0733   GFRAA >60 10/03/2019 0952    No results found for: "SPEP", "UPEP"  Lab Results  Component Value Date   WBC 6.4 01/21/2022   NEUTROABS 3.7 01/21/2022   HGB 13.8 01/21/2022   HCT 40.0 01/21/2022   MCV 94.8 01/21/2022   PLT 263 01/21/2022      Chemistry      Component Value Date/Time   NA 141 01/21/2022 0733   K 3.9 01/21/2022 0733   CL 105 01/21/2022 0733   CO2 31 01/21/2022 0733   BUN 14 01/21/2022 0733   CREATININE 1.22 01/21/2022  0272      Component Value Date/Time   CALCIUM 9.8 01/21/2022 0733   ALKPHOS 65 01/21/2022 0733   AST 22 01/21/2022 0733   ALT 20 01/21/2022 0733   BILITOT 0.6 01/21/2022 0733       RADIOGRAPHIC STUDIES: I have personally reviewed the radiological images as listed and agreed with the findings in the report. No results found.

## 2023-01-21 NOTE — Assessment & Plan Note (Deleted)
pT1NxM0, stage I, low grade, with perforation  -s/p laparoscopic surgery on 07/15/17 by Dr. Andrey Campanile. Pathology showed 1.7 cm GIST of proximal jejunum. -treated with Gleevec 09/2017 - 05/05/20, discontinued due to intolerable GI side effects.  -surveillance CT AP on 04/20/21 showed some wall thickening along anastomotic staple line in area of prior tumor. He underwent pushed endoscopy which showed no evidence of local recurrence.  -he is clinically doing well from a GIST standpoint. Labs reviewed, WNL. Physical exam was unremarkable. There is no clinical concern for recurrence. -restaging CT CAP done 02/15/2022 showed no evidence of lymphadenopathy or metastatic disease.

## 2023-01-24 ENCOUNTER — Inpatient Hospital Stay: Payer: 59 | Attending: Physician Assistant

## 2023-01-24 ENCOUNTER — Inpatient Hospital Stay: Payer: 59 | Admitting: Nurse Practitioner

## 2023-01-24 DIAGNOSIS — C49A3 Gastrointestinal stromal tumor of small intestine: Secondary | ICD-10-CM
